# Patient Record
Sex: Female | Born: 1970 | Race: White | Hispanic: No | Marital: Single | Smoking: Current some day smoker
Health system: Southern US, Community
[De-identification: ages and names within clinical notes are randomized; demographics above are authoritative.]

## PROBLEM LIST (undated history)

## (undated) DIAGNOSIS — J42 Unspecified chronic bronchitis: Secondary | ICD-10-CM

## (undated) DIAGNOSIS — N200 Calculus of kidney: Secondary | ICD-10-CM

## (undated) DIAGNOSIS — J189 Pneumonia, unspecified organism: Secondary | ICD-10-CM

## (undated) DIAGNOSIS — F329 Major depressive disorder, single episode, unspecified: Secondary | ICD-10-CM

## (undated) DIAGNOSIS — J449 Chronic obstructive pulmonary disease, unspecified: Secondary | ICD-10-CM

## (undated) DIAGNOSIS — M545 Low back pain, unspecified: Secondary | ICD-10-CM

## (undated) DIAGNOSIS — G43909 Migraine, unspecified, not intractable, without status migrainosus: Secondary | ICD-10-CM

## (undated) DIAGNOSIS — F32A Depression, unspecified: Secondary | ICD-10-CM

## (undated) DIAGNOSIS — S329XXA Fracture of unspecified parts of lumbosacral spine and pelvis, initial encounter for closed fracture: Secondary | ICD-10-CM

## (undated) DIAGNOSIS — IMO0002 Reserved for concepts with insufficient information to code with codable children: Secondary | ICD-10-CM

## (undated) DIAGNOSIS — K219 Gastro-esophageal reflux disease without esophagitis: Secondary | ICD-10-CM

## (undated) DIAGNOSIS — R0602 Shortness of breath: Secondary | ICD-10-CM

## (undated) DIAGNOSIS — F419 Anxiety disorder, unspecified: Secondary | ICD-10-CM

## (undated) DIAGNOSIS — B1920 Unspecified viral hepatitis C without hepatic coma: Secondary | ICD-10-CM

## (undated) DIAGNOSIS — G8929 Other chronic pain: Secondary | ICD-10-CM

---

## 1997-07-11 HISTORY — PX: HYSTEROSCOPY: SHX211

## 1997-07-11 HISTORY — PX: TUBAL LIGATION: SHX77

## 1999-07-12 HISTORY — PX: VAGINAL HYSTERECTOMY: SUR661

## 2004-12-24 ENCOUNTER — Emergency Department (HOSPITAL_COMMUNITY): Admission: EM | Admit: 2004-12-24 | Discharge: 2004-12-24 | Payer: Self-pay | Admitting: Emergency Medicine

## 2004-12-25 ENCOUNTER — Emergency Department (HOSPITAL_COMMUNITY): Admission: EM | Admit: 2004-12-25 | Discharge: 2004-12-25 | Payer: Self-pay | Admitting: Emergency Medicine

## 2005-01-15 ENCOUNTER — Emergency Department (HOSPITAL_COMMUNITY): Admission: EM | Admit: 2005-01-15 | Discharge: 2005-01-15 | Payer: Self-pay | Admitting: Emergency Medicine

## 2005-02-28 ENCOUNTER — Emergency Department (HOSPITAL_COMMUNITY): Admission: EM | Admit: 2005-02-28 | Discharge: 2005-02-28 | Payer: Self-pay | Admitting: Emergency Medicine

## 2005-09-21 ENCOUNTER — Emergency Department (HOSPITAL_COMMUNITY): Admission: EM | Admit: 2005-09-21 | Discharge: 2005-09-21 | Payer: Self-pay | Admitting: Emergency Medicine

## 2005-09-25 ENCOUNTER — Emergency Department (HOSPITAL_COMMUNITY): Admission: EM | Admit: 2005-09-25 | Discharge: 2005-09-26 | Payer: Self-pay | Admitting: Emergency Medicine

## 2005-12-23 ENCOUNTER — Emergency Department (HOSPITAL_COMMUNITY): Admission: EM | Admit: 2005-12-23 | Discharge: 2005-12-24 | Payer: Self-pay | Admitting: Emergency Medicine

## 2006-06-05 ENCOUNTER — Emergency Department: Payer: Self-pay | Admitting: Emergency Medicine

## 2006-06-06 ENCOUNTER — Other Ambulatory Visit: Payer: Self-pay

## 2006-06-10 ENCOUNTER — Emergency Department: Payer: Self-pay | Admitting: Emergency Medicine

## 2006-06-10 ENCOUNTER — Other Ambulatory Visit: Payer: Self-pay

## 2006-06-16 ENCOUNTER — Emergency Department: Payer: Self-pay | Admitting: Unknown Physician Specialty

## 2006-08-20 ENCOUNTER — Emergency Department (HOSPITAL_COMMUNITY): Admission: EM | Admit: 2006-08-20 | Discharge: 2006-08-21 | Payer: Self-pay | Admitting: Emergency Medicine

## 2006-08-20 ENCOUNTER — Emergency Department: Payer: Self-pay | Admitting: Emergency Medicine

## 2006-11-22 ENCOUNTER — Emergency Department (HOSPITAL_COMMUNITY): Admission: EM | Admit: 2006-11-22 | Discharge: 2006-11-22 | Payer: Self-pay | Admitting: Emergency Medicine

## 2007-01-03 ENCOUNTER — Emergency Department: Payer: Self-pay | Admitting: Emergency Medicine

## 2007-03-23 ENCOUNTER — Emergency Department (HOSPITAL_COMMUNITY): Admission: EM | Admit: 2007-03-23 | Discharge: 2007-03-23 | Payer: Self-pay | Admitting: Emergency Medicine

## 2007-05-09 ENCOUNTER — Emergency Department: Payer: Self-pay | Admitting: Emergency Medicine

## 2007-06-23 ENCOUNTER — Emergency Department (HOSPITAL_COMMUNITY): Admission: EM | Admit: 2007-06-23 | Discharge: 2007-06-23 | Payer: Self-pay | Admitting: Emergency Medicine

## 2007-07-12 HISTORY — PX: CHOLECYSTECTOMY: SHX55

## 2007-08-23 ENCOUNTER — Observation Stay: Payer: Self-pay | Admitting: Obstetrics and Gynecology

## 2007-08-26 ENCOUNTER — Emergency Department: Payer: Self-pay | Admitting: Emergency Medicine

## 2007-09-09 ENCOUNTER — Emergency Department: Payer: Self-pay | Admitting: Emergency Medicine

## 2007-09-22 ENCOUNTER — Emergency Department: Payer: Self-pay | Admitting: Emergency Medicine

## 2007-10-23 ENCOUNTER — Emergency Department (HOSPITAL_COMMUNITY): Admission: EM | Admit: 2007-10-23 | Discharge: 2007-10-24 | Payer: Self-pay | Admitting: Emergency Medicine

## 2007-10-30 ENCOUNTER — Emergency Department: Payer: Self-pay | Admitting: Emergency Medicine

## 2007-11-13 ENCOUNTER — Emergency Department (HOSPITAL_COMMUNITY): Admission: EM | Admit: 2007-11-13 | Discharge: 2007-11-13 | Payer: Self-pay | Admitting: Emergency Medicine

## 2007-11-21 ENCOUNTER — Emergency Department: Payer: Self-pay | Admitting: Emergency Medicine

## 2007-11-21 ENCOUNTER — Other Ambulatory Visit: Payer: Self-pay

## 2007-12-24 ENCOUNTER — Emergency Department (HOSPITAL_COMMUNITY): Admission: EM | Admit: 2007-12-24 | Discharge: 2007-12-24 | Payer: Self-pay | Admitting: Emergency Medicine

## 2008-07-12 ENCOUNTER — Emergency Department (HOSPITAL_COMMUNITY): Admission: EM | Admit: 2008-07-12 | Discharge: 2008-07-12 | Payer: Self-pay | Admitting: Emergency Medicine

## 2008-09-02 ENCOUNTER — Emergency Department (HOSPITAL_COMMUNITY): Admission: EM | Admit: 2008-09-02 | Discharge: 2008-09-03 | Payer: Self-pay | Admitting: Emergency Medicine

## 2008-09-15 ENCOUNTER — Emergency Department (HOSPITAL_COMMUNITY): Admission: EM | Admit: 2008-09-15 | Discharge: 2008-09-16 | Payer: Self-pay | Admitting: Emergency Medicine

## 2009-08-13 ENCOUNTER — Emergency Department: Payer: Self-pay | Admitting: Emergency Medicine

## 2009-09-03 ENCOUNTER — Emergency Department: Payer: Self-pay | Admitting: Emergency Medicine

## 2010-08-01 ENCOUNTER — Encounter: Payer: Self-pay | Admitting: *Deleted

## 2010-10-21 LAB — POCT I-STAT, CHEM 8
Chloride: 107 mEq/L (ref 96–112)
Glucose, Bld: 92 mg/dL (ref 70–99)
HCT: 36 % (ref 36.0–46.0)
Hemoglobin: 12.2 g/dL (ref 12.0–15.0)
Potassium: 3.9 mEq/L (ref 3.5–5.1)
Sodium: 142 mEq/L (ref 135–145)

## 2010-10-21 LAB — URINALYSIS, ROUTINE W REFLEX MICROSCOPIC
Bilirubin Urine: NEGATIVE
Leukocytes, UA: NEGATIVE
Nitrite: NEGATIVE
Specific Gravity, Urine: 1.017 (ref 1.005–1.030)
Urobilinogen, UA: 0.2 mg/dL (ref 0.0–1.0)
pH: 7 (ref 5.0–8.0)

## 2010-10-21 LAB — CBC
MCHC: 35.5 g/dL (ref 30.0–36.0)
Platelets: 282 10*3/uL (ref 150–400)
RBC: 3.87 MIL/uL (ref 3.87–5.11)
WBC: 5.8 10*3/uL (ref 4.0–10.5)

## 2010-10-21 LAB — DIFFERENTIAL
Basophils Relative: 1 % (ref 0–1)
Lymphs Abs: 1 10*3/uL (ref 0.7–4.0)
Monocytes Relative: 7 % (ref 3–12)
Neutro Abs: 4 10*3/uL (ref 1.7–7.7)
Neutrophils Relative %: 69 % (ref 43–77)

## 2010-10-21 LAB — BASIC METABOLIC PANEL
BUN: 9 mg/dL (ref 6–23)
Calcium: 8.9 mg/dL (ref 8.4–10.5)
Creatinine, Ser: 0.74 mg/dL (ref 0.4–1.2)
GFR calc Af Amer: >60 mL/min (ref >60)

## 2010-10-21 LAB — URINE MICROSCOPIC-ADD ON

## 2010-10-25 LAB — URINE MICROSCOPIC-ADD ON

## 2010-10-25 LAB — URINE CULTURE

## 2010-10-25 LAB — CBC
Hemoglobin: 12.6 g/dL (ref 12.0–15.0)
MCHC: 33.7 g/dL (ref 30.0–36.0)
RBC: 4.01 MIL/uL (ref 3.87–5.11)

## 2010-10-25 LAB — COMPREHENSIVE METABOLIC PANEL
ALT: 17 U/L (ref 0–35)
AST: 22 U/L (ref 0–37)
Alkaline Phosphatase: 50 U/L (ref 39–117)
CO2: 24 mEq/L (ref 19–32)
Calcium: 9.5 mg/dL (ref 8.4–10.5)
GFR calc Af Amer: >60 mL/min (ref >60)
GFR calc non Af Amer: >60 mL/min (ref >60)
Glucose, Bld: 98 mg/dL (ref 70–99)
Potassium: 3.7 mEq/L (ref 3.5–5.1)
Sodium: 138 mEq/L (ref 135–145)
Total Protein: 7.4 g/dL (ref 6.0–8.3)

## 2010-10-25 LAB — URINALYSIS, ROUTINE W REFLEX MICROSCOPIC
Bilirubin Urine: NEGATIVE
Glucose, UA: NEGATIVE mg/dL
Ketones, ur: NEGATIVE mg/dL
Protein, ur: 30 mg/dL — AB
pH: 6 (ref 5.0–8.0)

## 2010-10-25 LAB — DIFFERENTIAL
Basophils Relative: 1 % (ref 0–1)
Eosinophils Absolute: 0.2 10*3/uL (ref 0.0–0.7)
Eosinophils Relative: 4 % (ref 0–5)
Lymphs Abs: 2.3 10*3/uL (ref 0.7–4.0)
Monocytes Relative: 8 % (ref 3–12)

## 2010-10-26 LAB — BASIC METABOLIC PANEL
BUN: 8 mg/dL (ref 6–23)
CO2: 22 mEq/L (ref 19–32)
Chloride: 106 mEq/L (ref 96–112)
Creatinine, Ser: 0.72 mg/dL (ref 0.4–1.2)
GFR calc Af Amer: >60 mL/min (ref >60)
Glucose, Bld: 103 mg/dL — ABNORMAL HIGH (ref 70–99)

## 2010-10-26 LAB — URINE MICROSCOPIC-ADD ON

## 2010-10-26 LAB — URINALYSIS, ROUTINE W REFLEX MICROSCOPIC
Nitrite: NEGATIVE
Protein, ur: NEGATIVE mg/dL
Specific Gravity, Urine: 1.022 (ref 1.005–1.030)
Urobilinogen, UA: 0.2 mg/dL (ref 0.0–1.0)

## 2010-10-26 LAB — CBC
MCHC: 34.1 g/dL (ref 30.0–36.0)
MCV: 91.4 fL (ref 78.0–100.0)
RBC: 3.95 MIL/uL (ref 3.87–5.11)
RDW: 12.8 % (ref 11.5–15.5)

## 2010-10-26 LAB — DIFFERENTIAL
Basophils Relative: 1 % (ref 0–1)
Eosinophils Absolute: 0.1 10*3/uL (ref 0.0–0.7)
Monocytes Relative: 7 % (ref 3–12)
Neutrophils Relative %: 67 % (ref 43–77)

## 2011-01-19 ENCOUNTER — Emergency Department: Payer: Self-pay | Admitting: Emergency Medicine

## 2011-01-25 ENCOUNTER — Emergency Department: Payer: Self-pay | Admitting: Emergency Medicine

## 2011-04-05 LAB — DIFFERENTIAL
Basophils Absolute: 0
Basophils Relative: 0
Eosinophils Absolute: 0.2
Monocytes Absolute: 0.6
Monocytes Relative: 8
Neutro Abs: 4.6
Neutrophils Relative %: 67

## 2011-04-05 LAB — BASIC METABOLIC PANEL
Calcium: 9.1
Creatinine, Ser: 0.65
GFR calc Af Amer: >60
GFR calc non Af Amer: >60

## 2011-04-05 LAB — URINALYSIS, ROUTINE W REFLEX MICROSCOPIC
Bilirubin Urine: NEGATIVE
Nitrite: NEGATIVE
Specific Gravity, Urine: 1.017
pH: 6

## 2011-04-05 LAB — CBC
RBC: 4.07
WBC: 6.8

## 2011-04-05 LAB — PREGNANCY, URINE: Preg Test, Ur: NEGATIVE

## 2011-04-05 LAB — WET PREP, GENITAL: Clue Cells Wet Prep HPF POC: NONE SEEN

## 2011-04-05 LAB — GC/CHLAMYDIA PROBE AMP, GENITAL: Chlamydia, DNA Probe: NEGATIVE

## 2011-04-05 LAB — URINE CULTURE

## 2011-04-05 LAB — URINE MICROSCOPIC-ADD ON

## 2011-04-18 LAB — URINALYSIS, ROUTINE W REFLEX MICROSCOPIC
Bilirubin Urine: NEGATIVE
Glucose, UA: NEGATIVE
Ketones, ur: NEGATIVE
Nitrite: NEGATIVE
Protein, ur: NEGATIVE
Specific Gravity, Urine: 1.01
Urobilinogen, UA: 0.2
pH: 6.5

## 2011-04-18 LAB — URINE MICROSCOPIC-ADD ON

## 2011-04-22 LAB — URINALYSIS, ROUTINE W REFLEX MICROSCOPIC
Glucose, UA: NEGATIVE
Protein, ur: NEGATIVE

## 2011-04-22 LAB — DIFFERENTIAL
Eosinophils Absolute: 0.5
Lymphs Abs: 2.2
Monocytes Absolute: 0.4
Monocytes Relative: 8
Neutrophils Relative %: 45

## 2011-04-22 LAB — COMPREHENSIVE METABOLIC PANEL
ALT: 11
AST: 17
Albumin: 3.5
Calcium: 9
GFR calc Af Amer: >60
Glucose, Bld: 81
Sodium: 141
Total Protein: 6.4

## 2011-04-22 LAB — URINE MICROSCOPIC-ADD ON

## 2011-04-22 LAB — CBC
MCHC: 35.2
Platelets: 241
RDW: 12.7

## 2012-03-11 DIAGNOSIS — B1920 Unspecified viral hepatitis C without hepatic coma: Secondary | ICD-10-CM

## 2012-03-11 HISTORY — DX: Unspecified viral hepatitis C without hepatic coma: B19.20

## 2013-02-25 ENCOUNTER — Other Ambulatory Visit: Payer: Self-pay | Admitting: Internal Medicine

## 2013-07-13 ENCOUNTER — Emergency Department (HOSPITAL_COMMUNITY)
Admission: EM | Admit: 2013-07-13 | Discharge: 2013-07-13 | Disposition: A | Discharge status: Home / Self Care | Payer: No Typology Code available for payment source | Attending: Emergency Medicine | Admitting: Emergency Medicine

## 2013-07-13 ENCOUNTER — Encounter (HOSPITAL_COMMUNITY): Payer: Self-pay | Admitting: Emergency Medicine

## 2013-07-13 DIAGNOSIS — Z8709 Personal history of other diseases of the respiratory system: Secondary | ICD-10-CM | POA: Insufficient documentation

## 2013-07-13 DIAGNOSIS — Z79899 Other long term (current) drug therapy: Secondary | ICD-10-CM | POA: Insufficient documentation

## 2013-07-13 DIAGNOSIS — Z8679 Personal history of other diseases of the circulatory system: Secondary | ICD-10-CM | POA: Insufficient documentation

## 2013-07-13 DIAGNOSIS — R110 Nausea: Secondary | ICD-10-CM | POA: Insufficient documentation

## 2013-07-13 DIAGNOSIS — R1032 Left lower quadrant pain: Secondary | ICD-10-CM | POA: Insufficient documentation

## 2013-07-13 DIAGNOSIS — Z791 Long term (current) use of non-steroidal anti-inflammatories (NSAID): Secondary | ICD-10-CM | POA: Insufficient documentation

## 2013-07-13 DIAGNOSIS — N898 Other specified noninflammatory disorders of vagina: Secondary | ICD-10-CM | POA: Insufficient documentation

## 2013-07-13 DIAGNOSIS — F17200 Nicotine dependence, unspecified, uncomplicated: Secondary | ICD-10-CM | POA: Insufficient documentation

## 2013-07-13 DIAGNOSIS — Z87448 Personal history of other diseases of urinary system: Secondary | ICD-10-CM | POA: Insufficient documentation

## 2013-07-13 DIAGNOSIS — R350 Frequency of micturition: Secondary | ICD-10-CM | POA: Insufficient documentation

## 2013-07-13 DIAGNOSIS — R197 Diarrhea, unspecified: Secondary | ICD-10-CM | POA: Insufficient documentation

## 2013-07-13 DIAGNOSIS — Z87440 Personal history of urinary (tract) infections: Secondary | ICD-10-CM | POA: Insufficient documentation

## 2013-07-13 DIAGNOSIS — Z880 Allergy status to penicillin: Secondary | ICD-10-CM | POA: Insufficient documentation

## 2013-07-13 DIAGNOSIS — R109 Unspecified abdominal pain: Secondary | ICD-10-CM

## 2013-07-13 DIAGNOSIS — R339 Retention of urine, unspecified: Secondary | ICD-10-CM | POA: Insufficient documentation

## 2013-07-13 HISTORY — DX: Migraine, unspecified, not intractable, without status migrainosus: G43.909

## 2013-07-13 HISTORY — DX: Unspecified chronic bronchitis: J42

## 2013-07-13 HISTORY — DX: Calculus of kidney: N20.0

## 2013-07-13 LAB — CBC WITH DIFFERENTIAL/PLATELET
BASOS PCT: 1 % (ref 0–1)
Basophils Absolute: 0.1 10*3/uL (ref 0.0–0.1)
EOS ABS: 0.4 10*3/uL (ref 0.0–0.7)
EOS PCT: 8 % — AB (ref 0–5)
HCT: 33.5 % — ABNORMAL LOW (ref 36.0–46.0)
HEMOGLOBIN: 11.5 g/dL — AB (ref 12.0–15.0)
LYMPHS ABS: 1.5 10*3/uL (ref 0.7–4.0)
Lymphocytes Relative: 28 % (ref 12–46)
MCH: 31.2 pg (ref 26.0–34.0)
MCHC: 34.3 g/dL (ref 30.0–36.0)
MCV: 90.8 fL (ref 78.0–100.0)
MONO ABS: 0.4 10*3/uL (ref 0.1–1.0)
MONOS PCT: 7 % (ref 3–12)
NEUTROS PCT: 56 % (ref 43–77)
Neutro Abs: 2.8 10*3/uL (ref 1.7–7.7)
Platelets: 306 10*3/uL (ref 150–400)
RBC: 3.69 MIL/uL — AB (ref 3.87–5.11)
RDW: 13.1 % (ref 11.5–15.5)
WBC: 5.1 10*3/uL (ref 4.0–10.5)

## 2013-07-13 LAB — COMPREHENSIVE METABOLIC PANEL
ALBUMIN: 3.6 g/dL (ref 3.5–5.2)
ALK PHOS: 58 U/L (ref 39–117)
ALT: 59 U/L — ABNORMAL HIGH (ref 0–35)
AST: 36 U/L (ref 0–37)
BUN: 11 mg/dL (ref 6–23)
CALCIUM: 9.1 mg/dL (ref 8.4–10.5)
CO2: 24 mEq/L (ref 19–32)
CREATININE: 0.77 mg/dL (ref 0.50–1.10)
Chloride: 106 mEq/L (ref 96–112)
GFR calc non Af Amer: >90 mL/min (ref >90)
GLUCOSE: 79 mg/dL (ref 70–99)
Potassium: 4 mEq/L (ref 3.7–5.3)
Sodium: 142 mEq/L (ref 137–147)
TOTAL PROTEIN: 7.2 g/dL (ref 6.0–8.3)
Total Bilirubin: 0.2 mg/dL — ABNORMAL LOW (ref 0.3–1.2)

## 2013-07-13 LAB — URINALYSIS, ROUTINE W REFLEX MICROSCOPIC
BILIRUBIN URINE: NEGATIVE
Glucose, UA: NEGATIVE mg/dL
Ketones, ur: NEGATIVE mg/dL
LEUKOCYTES UA: NEGATIVE
NITRITE: NEGATIVE
PH: 6 (ref 5.0–8.0)
Protein, ur: NEGATIVE mg/dL
SPECIFIC GRAVITY, URINE: 1.019 (ref 1.005–1.030)
UROBILINOGEN UA: 0.2 mg/dL (ref 0.0–1.0)

## 2013-07-13 LAB — URINE MICROSCOPIC-ADD ON

## 2013-07-13 LAB — WET PREP, GENITAL
CLUE CELLS WET PREP: NONE SEEN
TRICH WET PREP: NONE SEEN
Yeast Wet Prep HPF POC: NONE SEEN

## 2013-07-13 LAB — OCCULT BLOOD, POC DEVICE: FECAL OCCULT BLD: NEGATIVE

## 2013-07-13 LAB — LIPASE, BLOOD: LIPASE: 26 U/L (ref 11–59)

## 2013-07-13 MED ORDER — DICYCLOMINE HCL 20 MG PO TABS: 20.0000 mg | ORAL_TABLET | Freq: Two times a day (BID) | ORAL | Status: DC

## 2013-07-13 MED ORDER — SODIUM CHLORIDE 0.9 % IV BOLUS (SEPSIS)
1000.0000 mL | Freq: Once | INTRAVENOUS | Status: AC
  Administered 2013-07-13: 1000 mL via INTRAVENOUS

## 2013-07-13 MED ORDER — DICYCLOMINE HCL 10 MG PO CAPS
10.0000 mg | ORAL_CAPSULE | Freq: Once | ORAL | Status: AC
  Administered 2013-07-13: 10 mg via ORAL
  Filled 2013-07-13: qty 1

## 2013-07-13 MED ORDER — MORPHINE SULFATE 4 MG/ML IJ SOLN
4.0000 mg | Freq: Once | INTRAMUSCULAR | Status: AC
  Administered 2013-07-13: 4 mg via INTRAVENOUS
  Filled 2013-07-13: qty 1

## 2013-07-13 MED ORDER — DICYCLOMINE HCL 10 MG/ML IM SOLN: 20.0000 mg | Freq: Once | INTRAMUSCULAR | Status: DC

## 2013-07-13 MED ORDER — ONDANSETRON HCL 4 MG/2ML IJ SOLN
4.0000 mg | Freq: Once | INTRAMUSCULAR | Status: AC
  Administered 2013-07-13: 4 mg via INTRAVENOUS
  Filled 2013-07-13: qty 2

## 2013-07-13 NOTE — ED Provider Notes (Signed)
CSN: [id number]     Arrival date & time 07/13/13  [id number] History   First MD Initiated Contact with Patient 07/13/13 [phone]     Chief Complaint  Patient presents with  . Abdominal Pain   (Consider location/radiation/quality/duration/timing/severity/associated sxs/prior Treatment) HPI Comments: Patient is a 43 year old female with history of chronic bronchitis, kidney stones, and migraines who presents today for abdominal pain. For the past 6 months she has been having lower abdominal cramping associated with loose stools and "rabbit" sized stool. She feels as though her loose stools are mucousy. She reports that they have been gradually darkening without BRBPR. Her pain has the quality of "a pitchfork being stuck in me and twisted". The pain does not radiate. She has not taken anything for the pain. She has urinary frequency and feeling of incomplete emptying. This has also been chronic for the past 6 months. She has been evaluated for this in the past and was referred to GI. She has not followed up because she was recently released from prison. She denies fevers, chills, vomiting, chest pain, shortness of breath.   The history is provided by the patient. No language interpreter was used.    Past Medical History  Diagnosis Date  . Chronic bronchitis   . Renal disorder   . Kidney stone   . Migraine    Past Surgical History  Procedure Laterality Date  . Cholecystectomy    . Abdominal hysterectomy     No family history on file. History  Substance Use Topics  . Smoking status: Current Every Day Smoker -- 0.50 packs/day    Types: Cigarettes  . Smokeless tobacco: Not on file  . Alcohol Use: No   OB History   Grav Para Term Preterm Abortions TAB SAB Ect Mult Living                 Review of Systems  Constitutional: Negative for fever and chills.  Respiratory: Negative for shortness of breath.   Cardiovascular: Negative for chest pain.  Gastrointestinal: Positive for nausea, abdominal  pain and diarrhea (loose stools). Negative for vomiting.  Genitourinary: Positive for frequency. Negative for dysuria, vaginal bleeding and vaginal discharge.  All other systems reviewed and are negative.    Allergies  Bactrim and Penicillins  Home Medications   Current Outpatient Rx  Name  Route  Sig  Dispense  Refill  . albuterol (PROVENTIL HFA;VENTOLIN HFA) 108 (90 BASE) MCG/ACT inhaler   Inhalation   Inhale 2 puffs into the lungs every 6 (six) hours as needed for wheezing or shortness of breath.         . citalopram (CELEXA) 40 MG tablet   Oral   Take 40 mg by mouth daily.         . hydrOXYzine (ATARAX/VISTARIL) 50 MG tablet   Oral   Take 50 mg by mouth 2 (two) times daily.         . hyoscyamine (LEVSIN SL) 0.125 MG SL tablet   Sublingual   Place 0.125 mg under the tongue every 4 (four) hours as needed for cramping.         Marland Kitchen. ibuprofen (ADVIL,MOTRIN) 800 MG tablet   Oral   Take 800 mg by mouth 3 (three) times daily.         Marland Kitchen. loratadine (CLARITIN) 10 MG tablet   Oral   Take 10 mg by mouth daily.          BP 127/91  Pulse 121  Temp(Src)  98.3 F (36.8 C) (Oral)  Resp 18  SpO2 100% Physical Exam  Nursing note and vitals reviewed. Constitutional: She is oriented to person, place, and time. She appears well-developed and well-nourished. No distress.  HENT:  Head: Normocephalic and atraumatic.  Right Ear: External ear normal.  Left Ear: External ear normal.  Nose: Nose normal.  Mouth/Throat: Oropharynx is clear and moist.  Eyes: Conjunctivae are normal.  Neck: Normal range of motion.  Cardiovascular: Normal rate, regular rhythm and normal heart sounds.   Pulmonary/Chest: Effort normal and breath sounds normal. No stridor. No respiratory distress. She has no wheezes. She has no rales.  Abdominal: Soft. She exhibits no distension. There is tenderness in the suprapubic area and left lower quadrant. There is no rigidity, no rebound and no guarding.   Genitourinary: Rectum normal. Rectal exam shows no mass, no tenderness and anal tone normal. Guaiac negative stool. No labial fusion. There is no rash, tenderness, lesion or injury on the right labia. There is no rash, tenderness, lesion or injury on the left labia. Cervix exhibits discharge (white). Cervix exhibits no motion tenderness and no friability. Right adnexum displays tenderness. Right adnexum displays no mass and no fullness. Left adnexum displays tenderness. Left adnexum displays no mass and no fullness. No erythema, tenderness or bleeding around the vagina. No foreign body around the vagina. No signs of injury around the vagina. No vaginal discharge found.  Light brown stool seen on rectal exam  Musculoskeletal: Normal range of motion.  Neurological: She is alert and oriented to person, place, and time. She has normal strength.  Skin: Skin is warm and dry. She is not diaphoretic. No erythema.  Psychiatric: She has a normal mood and affect. Her behavior is normal.    ED Course  Procedures (including critical care time) Labs Review Labs Reviewed  WET PREP, GENITAL - Abnormal; Notable for the following:    WBC, Wet Prep HPF POC FEW (*)    All other components within normal limits  CBC WITH DIFFERENTIAL - Abnormal; Notable for the following:    RBC 3.69 (*)    Hemoglobin 11.5 (*)    HCT 33.5 (*)    Eosinophils Relative 8 (*)    All other components within normal limits  COMPREHENSIVE METABOLIC PANEL - Abnormal; Notable for the following:    ALT 59 (*)    Total Bilirubin 0.2 (*)    All other components within normal limits  URINALYSIS, ROUTINE W REFLEX MICROSCOPIC - Abnormal; Notable for the following:    APPearance CLOUDY (*)    Hgb urine dipstick SMALL (*)    All other components within normal limits  URINE MICROSCOPIC-ADD ON - Abnormal; Notable for the following:    Squamous Epithelial / LPF FEW (*)    Bacteria, UA FEW (*)    All other components within normal limits   GC/CHLAMYDIA PROBE AMP  LIPASE, BLOOD   Imaging Review No results found.  EKG Interpretation   None       MDM   1. Abdominal pain    Patient is nontoxic, nonseptic appearing, in no apparent distress.  Patient's pain and other symptoms adequately managed in emergency department.  Patient feels significantly improved after Bentyl. Fluid bolus given.  Labs and vitals reviewed. I do not feel patient needs additional imaging at this time. Patient does not meet the SIRS or Sepsis criteria.  On repeat exam patient does not have a surgical abdomen and there are no peritoneal signs.  No indication of  appendicitis, bowel obstruction, bowel perforation, cholecystitis, diverticulitis, ovarian torsion, PID, or ectopic pregnancy.  Patient discharged home with symptomatic treatment and given strict instructions for follow-up with GI.  I have also discussed reasons to return immediately to the ER.  Patient expresses understanding and agrees with plan.       Mora Bellman, PA-C 07/13/13 [phone]

## 2013-07-13 NOTE — Discharge Instructions (Signed)
Abdominal Pain, Women °Abdominal (stomach, pelvic, or belly) pain can be caused by many things. It is important to tell your doctor: °· The location of the pain. °· Does it come and go or is it present all the time? °· Are there things that start the pain (eating certain foods, exercise)? °· Are there other symptoms associated with the pain (fever, nausea, vomiting, diarrhea)? °All of this is helpful to know when trying to find the cause of the pain. °CAUSES  °· Stomach: virus or bacteria infection, or ulcer. °· Intestine: appendicitis (inflamed appendix), regional ileitis (Crohn's disease), ulcerative colitis (inflamed colon), irritable bowel syndrome, diverticulitis (inflamed diverticulum of the colon), or cancer of the stomach or intestine. °· Gallbladder disease or stones in the gallbladder. °· Kidney disease, kidney stones, or infection. °· Pancreas infection or cancer. °· Fibromyalgia (pain disorder). °· Diseases of the female organs: °· Uterus: fibroid (non-cancerous) tumors or infection. °· Fallopian tubes: infection or tubal pregnancy. °· Ovary: cysts or tumors. °· Pelvic adhesions (scar tissue). °· Endometriosis (uterus lining tissue growing in the pelvis and on the pelvic organs). °· Pelvic congestion syndrome (female organs filling up with blood just before the menstrual period). °· Pain with the menstrual period. °· Pain with ovulation (producing an egg). °· Pain with an IUD (intrauterine device, birth control) in the uterus. °· Cancer of the female organs. °· Functional pain (pain not caused by a disease, may improve without treatment). °· Psychological pain. °· Depression. °DIAGNOSIS  °Your doctor will decide the seriousness of your pain by doing an examination. °· Blood tests. °· X-rays. °· Ultrasound. °· CT scan (computed tomography, special type of X-ray). °· MRI (magnetic resonance imaging). °· Cultures, for infection. °· Barium enema (dye inserted in the large intestine, to better view it with  X-rays). °· Colonoscopy (looking in intestine with a lighted tube). °· Laparoscopy (minor surgery, looking in abdomen with a lighted tube). °· Major abdominal exploratory surgery (looking in abdomen with a large incision). °TREATMENT  °The treatment will depend on the cause of the pain.  °· Many cases can be observed and treated at home. °· Over-the-counter medicines recommended by your caregiver. °· Prescription medicine. °· Antibiotics, for infection. °· Birth control pills, for painful periods or for ovulation pain. °· Hormone treatment, for endometriosis. °· Nerve blocking injections. °· Physical therapy. °· Antidepressants. °· Counseling with a psychologist or psychiatrist. °· Minor or major surgery. °HOME CARE INSTRUCTIONS  °· Do not take laxatives, unless directed by your caregiver. °· Take over-the-counter pain medicine only if ordered by your caregiver. Do not take aspirin because it can cause an upset stomach or bleeding. °· Try a clear liquid diet (broth or water) as ordered by your caregiver. Slowly move to a bland diet, as tolerated, if the pain is related to the stomach or intestine. °· Have a thermometer and take your temperature several times a day, and record it. °· Bed rest and sleep, if it helps the pain. °· Avoid sexual intercourse, if it causes pain. °· Avoid stressful situations. °· Keep your follow-up appointments and tests, as your caregiver orders. °· If the pain does not go away with medicine or surgery, you may try: °· Acupuncture. °· Relaxation exercises (yoga, meditation). °· Group therapy. °· Counseling. °SEEK MEDICAL CARE IF:  °· You notice certain foods cause stomach pain. °· Your home care treatment is not helping your pain. °· You need stronger pain medicine. °· You want your IUD removed. °· You feel faint or   lightheaded. °· You develop nausea and vomiting. °· You develop a rash. °· You are having side effects or an allergy to your medicine. °SEEK IMMEDIATE MEDICAL CARE IF:  °· Your  pain does not go away or gets worse. °· You have a fever. °· Your pain is felt only in portions of the abdomen. The right side could possibly be appendicitis. The left lower portion of the abdomen could be colitis or diverticulitis. °· You are passing blood in your stools (bright red or black tarry stools, with or without vomiting). °· You have blood in your urine. °· You develop chills, with or without a fever. °· You pass out. °MAKE SURE YOU:  °· Understand these instructions. °· Will watch your condition. °· Will get help right away if you are not doing well or get worse. °Document Released: 04/24/2007 Document Revised: 09/19/2011 Document Reviewed: 05/14/2009 °ExitCare® Patient Information ©2014 [employer]. ° °Emergency Department Resource Guide °1) Find a Doctor and Pay Out of Pocket °Although you won't have to find out who is covered by your insurance plan, it is a good idea to ask around and get recommendations. You will then need to call the office and see if the doctor you have chosen will accept you as a new patient and what types of options they offer for patients who are self-pay. Some doctors offer discounts or will set up payment plans for their patients who do not have insurance, but you will need to ask so you aren't surprised when you get to your appointment. ° °2) Contact Your Local Health Department °Not all health departments have doctors that can see patients for sick visits, but many do, so it is worth a call to see if yours does. If you don't know where your local health department is, you can check in your phone book. The CDC also has a tool to help you locate your state's health department, and many state websites also have listings of all of their local health departments. ° °3) Find a Walk-in Clinic °If your illness is not likely to be very severe or complicated, you may want to try a walk in clinic. These are popping up all over the country in pharmacies, drugstores, and shopping  centers. They're usually staffed by nurse practitioners or physician assistants that have been trained to treat common illnesses and complaints. They're usually fairly quick and inexpensive. However, if you have serious medical issues or chronic medical problems, these are probably not your best option. ° °No Primary Care Doctor: °- Call [facility] at  832-8000 - they can help you locate a primary care doctor that  accepts your insurance, provides certain services, etc. °- Physician Referral Service- 1-800-533-3463 ° °Chronic Pain Problems: °Organization         Address  Phone   Notes  °[facility]  (336) 297-2271 Patients need to be referred by their primary care doctor.  ° °Medication Assistance: °Organization         Address  Phone   Notes  °[place] [facility] [address] °[place], [place] [address] (336) 641-8030 --Must be a resident of [place] °-- Must have NO insurance coverage whatsoever (no Medicaid/ Medicare, etc.) °-- The pt. MUST have a primary care doctor that directs their care regularly and follows them in the community °  °[facility]  (866) 331-1348   °[employer]  (888) 892-1162   ° °Agencies that provide inexpensive medical care: °Organization           Address  Phone   Notes  °[facility]  (336) 832-8035   °[facility]    (336) 832-7272   °[facility] [address] °[place], [place] [address] (336) 832-4777   °[facility] [address], °[place] (336) 271-4999   °[facility]    (336) 373-0678   °[facility]    (336) 272-1050   °[facility] ° [address], [place] Phone:  (336) 832-4444, Fax:  (336) 832-4440 Hours of Operation:  9 am - 6 pm, M-F.  Also accepts Medicaid/Medicare and self-pay.  °[facility] ° [address], [place] Phone: (336) [phone], Fax: (336)  [phone]. Hours of Operation:  8:30 am - 5:30 pm, M-F.  Also accepts Medicaid and self-pay.  °[facility] [place] [address], [place] Phone: (336) 878-6027   °[facility] [address], [place], [place] (336)723-1848, Ext. 123 Mondays & Thursdays: 7-9 AM.  First 15 patients are seen on a first come, first serve basis. °  ° °Medicaid-accepting [place] Providers: ° °Organization         Address  Phone   Notes  °[facility] [address], [place] (336) 641-2100 Also accepts self-pay patients.  °[facility] [address], [place] ° (336) 856-9996   °[facility] [address], [place] (336) 288-8857   °[facility] [address] [place] [address], [place] (336) 299-7000   °Veita Bland [address], [place]  ° (336) 373-1557 Only accepts [place] Access Medicaid patients after they have their name applied to their card.  ° °Self-Pay (no insurance) in [place]: ° °Organization         Address  Phone   Notes  °Sickle Cell Patients, [facility] [address], [place] (336) 832-1970   °[facility] [address], [place] (336) 832-4400   °[facility] ° [address], [place] (336) 992-4800   °[facility]/Dr. Osei-Bonsu ° [address] [place] [address], [place] or [address], [place] (336) [phone] Phone number for both [place] and [place] locations is the same.  °[facility] [address], [place] (336) 299-0000   °[facility] [address] [place] [address], [place] or [address] (336) 852-7530 °(336) 878-2260   °[facility] [address], [place] (336) 350-1642, phone; (336) 294-5005, fax Sees patients 1st and 3rd Saturday of every month.  Must not qualify for public or private insurance (i.e.  Medicaid, Medicare, [place] Health Choice, Veterans' Benefits) • Household income should be no more than 200% of the poverty level •The clinic cannot treat you if you are pregnant or think you are pregnant • Sexually transmitted diseases are not treated at the clinic.  ° ° °Dental Care: °Organization         Address  Phone  Notes  °[facility] [address], [place] (336) 641-6152 Accepts children up to age 21 who are enrolled in Medicaid or [place] Health Choice; pregnant women with a Medicaid card; and children who have applied for Medicaid or  Health Choice, but were declined, whose parents can pay a reduced fee at time   of service.  °[facility] [place]  [address], [place] (336) 641-7733 Accepts children up to age 21 who are enrolled in Medicaid or [place] Health Choice; pregnant women with a Medicaid card; and children who have applied for Medicaid or [place] Health Choice, but were declined, whose parents can pay a reduced fee at time of service.  °[facility] ° [address], [place] (336) 641-4533 Patients are seen by appointment only. Walk-ins are not accepted. [facility] will see patients 18 years of age and older. °Monday - Tuesday (8am-5pm) °Most Wednesdays (8:30-5pm) °$30 per visit, cash only  °[facility] ° [address], [place] (336) 641-4533 Patients are seen by appointment only. Walk-ins are not accepted. [facility] will see patients 18 years of age and older. °One Wednesday Evening (Monthly: Volunteer Based).  $30 per visit, cash only  °[facility]  (919) 537-3737 for adults; Children under age 4, call Graduate Pediatric Dentistry at ([phone]) [phone]. Children aged 4-14, please call ([phone]) [phone] to request a pediatric application. ° Dental services are provided in all areas of dental care including fillings,  crowns and bridges, complete and partial dentures, implants, gum treatment, root canals, and extractions. Preventive care is also provided. Treatment is provided to both adults and children. °Patients are selected via a lottery and there is often a waiting list. °  °[facility] [address], °[place] ° (336) 763-8833 [email or web address] °  °[facility] [address], [place], [place] (336)723-1848, Ext. 123 Second and Fourth Thursday of each month, opens at 6:30 AM; Clinic ends at 9 AM.  Patients are seen on a first-come first-served basis, and a limited number are seen during each clinic.  ° °[facility] ° [address], [place], [place] (336) 723-7904   Eligibility Requirements °You must have lived in [place], [place], or [place] counties for at least the last three months. °  You cannot be eligible for state or federal sponsored healthcare insurance, including Veterans Administration, Medicaid, or Medicare. °  You generally cannot be eligible for healthcare insurance through your employer.  °  How to apply: °Eligibility screenings are held every Tuesday and Wednesday afternoon from 1:00 pm until 4:00 pm. You do not need an appointment for the interview!  °[facility] [address], [place], [place] 336-631-2330   °[facility]  336-342-8273   °[facility]  336-703-3100   °[facility]  336-570-6415   ° °Behavioral Health Resources in the Community: °Intensive Outpatient Programs °Organization         Address  Phone  Notes  °[facility] [address], [place], [place] 336-878-6098   °[facility] [address], [place], [place] 336-832-9800   °[facility] [address], [place], [place] ° 336-882-2125   °[facility] [address],  °[place], [place] 1-800-853-5163 or 336-641-4981   °Substance Abuse  Resources °Organization         Address  Phone  Notes  °[facility]  336-882-2125   °[facility]  336-784-9470   °[facility]  336-285-9073   °[facility]  336-845-3988   °Residential & Outpatient Substance Abuse Program  1-800-659-3381   °Psychological Services °Organization         Address    Phone  Notes  °[facility]  336- 832-9600   °[facility]  336- 378-7881   °[facility] [address], [place] 1-800-853-5163 or 336-641-4981   ° °Mobile Crisis Teams °Organization         Address  Phone  Notes  °[facility], Mobile Crisis Care Unit  1-877-626-1772   °Assertive °[facility] ° [address] [place], [place] 336-834-9664   °Sharon DeEsch [address] °[place] 336-554-5454   ° °Self-Help/Support Groups °Organization         Address  Phone             Notes  °[facility] - variety of support groups  336- 373-1402 Call for more information  °Narcotics Anonymous (NA), [facility] [address], °[place]  2 meetings at this location  ° °Residential Treatment Programs °Organization         Address  Phone  Notes  °[facility] [address],    °[place]  1-866-801-8205   °[facility] ° [address], [place], [place] 704-293-8524   °[facility] [address], [place] 336-845-3988 Admissions: 8am-3pm M-F  °[facility] [address]    °[place], [place] 336-841-1104   °[facility] [address], [place], [place] 336-379-7146   °[facility] [address]  °[place], [place] 336-285-9073   °[facility] - Intensive Outpatient [address], , [place] 336-852-3033   °[facility] [address]  °[place], [place] 1-877-615-2722 or 336-784-9470   °[facility] [address] [place], [place] 336-227-7417 Accepts Medicaid  °[facility] [address]  ° [place] 1-800-659-3381 Substance Abuse/Addiction Treatment  ° °[place] Behavioral Health Resources °Organization         Address  Phone  Notes  °[facility]  (888) 581-9988   °Julie Brannon, PhD [address] [place], [place]   (336) 349-5553 or (336) 951-0000   °[facility]   [address] °[place], [place] (336) 349-4454   °[facility] [address], [place], [place] (336) 342-8316 Insurance/Medicaid/sponsorship through [facility]  °[facility] [address]                                    [place], [place] (336) 342-8316 Therapy/tele-psych/case  °[facility] [address]  ° [place], [place] (336) 349-2233    °Dr. Arfeen  (336) 349-4544   °[facility] [place]  [employer] [place] [facility] 1) [address], [place] °2) [address], [place] °3)  [address], [place] (336) 349-3220 °(336) 342-7768 ° °(336) 342-8140   °[place] Child Abuse Hot[facility] (336) 342-1394 or (336) 342-3537 (After Hours)    ° ° ° °

## 2013-07-13 NOTE — ED Provider Notes (Signed)
Medical screening examination/treatment/procedure(s) were performed by non-physician practitioner and as supervising physician I was immediately available for consultation/collaboration.  EKG Interpretation   None         Gilda Creasehristopher J. Junetta Hearn, MD 07/13/13 1113

## 2013-07-13 NOTE — ED Notes (Signed)
Per pt has had lower abd pain related to irregular stools. Pt reports worsening pain over the past month. Pt reports continuously having BMs either loose or "rabbit turds" with mucus around BM. Pt denies bleeding from rectum.

## 2013-07-13 NOTE — ED Notes (Signed)
Unsuccessful IV attempt x2. P Dowd notified, will attempt IV insertion.

## 2013-07-15 LAB — GC/CHLAMYDIA PROBE AMP
CT Probe RNA: NEGATIVE
GC PROBE AMP APTIMA: NEGATIVE

## 2013-07-23 ENCOUNTER — Emergency Department (HOSPITAL_COMMUNITY)
Admission: EM | Admit: 2013-07-23 | Discharge: 2013-07-23 | Disposition: A | Discharge status: Home / Self Care | Payer: No Typology Code available for payment source | Attending: Emergency Medicine | Admitting: Emergency Medicine

## 2013-07-23 ENCOUNTER — Encounter (HOSPITAL_COMMUNITY): Payer: Self-pay | Admitting: Emergency Medicine

## 2013-07-23 ENCOUNTER — Emergency Department (HOSPITAL_COMMUNITY): Payer: No Typology Code available for payment source

## 2013-07-23 DIAGNOSIS — R05 Cough: Secondary | ICD-10-CM

## 2013-07-23 DIAGNOSIS — R6889 Other general symptoms and signs: Secondary | ICD-10-CM

## 2013-07-23 DIAGNOSIS — R000 Tachycardia, unspecified: Secondary | ICD-10-CM | POA: Insufficient documentation

## 2013-07-23 DIAGNOSIS — F17200 Nicotine dependence, unspecified, uncomplicated: Secondary | ICD-10-CM | POA: Insufficient documentation

## 2013-07-23 DIAGNOSIS — R059 Cough, unspecified: Secondary | ICD-10-CM

## 2013-07-23 DIAGNOSIS — Z87442 Personal history of urinary calculi: Secondary | ICD-10-CM | POA: Insufficient documentation

## 2013-07-23 DIAGNOSIS — Z8679 Personal history of other diseases of the circulatory system: Secondary | ICD-10-CM | POA: Insufficient documentation

## 2013-07-23 DIAGNOSIS — J40 Bronchitis, not specified as acute or chronic: Secondary | ICD-10-CM | POA: Insufficient documentation

## 2013-07-23 DIAGNOSIS — R110 Nausea: Secondary | ICD-10-CM | POA: Insufficient documentation

## 2013-07-23 DIAGNOSIS — IMO0001 Reserved for inherently not codable concepts without codable children: Secondary | ICD-10-CM | POA: Insufficient documentation

## 2013-07-23 DIAGNOSIS — Z87448 Personal history of other diseases of urinary system: Secondary | ICD-10-CM | POA: Insufficient documentation

## 2013-07-23 DIAGNOSIS — Z880 Allergy status to penicillin: Secondary | ICD-10-CM | POA: Insufficient documentation

## 2013-07-23 DIAGNOSIS — Z79899 Other long term (current) drug therapy: Secondary | ICD-10-CM | POA: Insufficient documentation

## 2013-07-23 MED ORDER — ONDANSETRON HCL 4 MG/2ML IJ SOLN
4.0000 mg | Freq: Once | INTRAMUSCULAR | Status: AC
Start: 2013-07-23 — End: 2013-07-23
  Administered 2013-07-23: 4 mg via INTRAVENOUS
  Filled 2013-07-23: qty 2

## 2013-07-23 MED ORDER — SODIUM CHLORIDE 0.9 % IV BOLUS (SEPSIS)
1000.0000 mL | Freq: Once | INTRAVENOUS | Status: AC
  Administered 2013-07-23: 1000 mL via INTRAVENOUS

## 2013-07-23 MED ORDER — HYDROCOD POLST-CHLORPHEN POLST 10-8 MG/5ML PO LQCR: 5.0000 mL | Freq: Two times a day (BID) | ORAL | Status: DC | PRN

## 2013-07-23 MED ORDER — IPRATROPIUM BROMIDE 0.02 % IN SOLN
0.5000 mg | Freq: Once | RESPIRATORY_TRACT | Status: AC
  Administered 2013-07-23: 0.5 mg via RESPIRATORY_TRACT
  Filled 2013-07-23: qty 2.5

## 2013-07-23 MED ORDER — ALBUTEROL SULFATE (2.5 MG/3ML) 0.083% IN NEBU
5.0000 mg | INHALATION_SOLUTION | Freq: Once | RESPIRATORY_TRACT | Status: AC
  Administered 2013-07-23: 5 mg via RESPIRATORY_TRACT
  Filled 2013-07-23: qty 6

## 2013-07-23 NOTE — Progress Notes (Signed)
[facility] provided pt with a list of primary care resources.  °

## 2013-07-23 NOTE — ED Provider Notes (Signed)
CSN: [id number]     Arrival date & time 07/23/13  1153 History   First MD Initiated Contact with Patient 07/23/13 1246     Chief Complaint  Patient presents with  . Cough   (Consider location/radiation/quality/duration/timing/severity/associated sxs/prior Treatment) HPI Comments: Pt is a 43 y/o female with a PMHx of chronic bronchitis who presents to the ED complaining of worsening cough x 2 days. Pt states she was diagnosed with a sinus infection 2 days ago, began taking azithromycin. Since then she has had worsening cough, productive with yellow mucus, body aches, nausea, subjective fever and chills. She is a daily smoker but "has not been able to smoke for the past few days". She stays at the shelter and is around sick people, however unsure of their symptoms. Admits to chest congestion and tightness, no relief with inhalers. Denies vomiting, abdominal pain, chest pain or diarrhea.  Patient is a 43 y.o. female presenting with cough. The history is provided by the patient.  Cough Associated symptoms: fever, myalgias and wheezing     Past Medical History  Diagnosis Date  . Chronic bronchitis   . Renal disorder   . Kidney stone   . Migraine    Past Surgical History  Procedure Laterality Date  . Cholecystectomy    . Abdominal hysterectomy     No family history on file. History  Substance Use Topics  . Smoking status: Current Every Day Smoker -- 0.50 packs/day    Types: Cigarettes  . Smokeless tobacco: Not on file  . Alcohol Use: No   OB History   Grav Para Term Preterm Abortions TAB SAB Ect Mult Living                 Review of Systems  Constitutional: Positive for fever.  HENT: Positive for congestion.   Respiratory: Positive for cough, chest tightness and wheezing.   Gastrointestinal: Positive for nausea.  Musculoskeletal: Positive for arthralgias and myalgias.  All other systems reviewed and are negative.    Allergies  Bactrim and Penicillins  Home Medications    Current Outpatient Rx  Name  Route  Sig  Dispense  Refill  . acetaminophen (TYLENOL) 160 MG/5ML suspension   Oral   Take 320 mg by mouth every 6 (six) hours as needed (pain).         Marland Kitchen albuterol (PROVENTIL HFA;VENTOLIN HFA) 108 (90 BASE) MCG/ACT inhaler   Inhalation   Inhale 2 puffs into the lungs every 6 (six) hours as needed for wheezing or shortness of breath.         Marland Kitchen amitriptyline (ELAVIL) 50 MG tablet   Oral   Take 50 mg by mouth at bedtime.         . benzonatate (TESSALON) 100 MG capsule   Oral   Take 100 mg by mouth 3 (three) times daily as needed for cough (cough).         . citalopram (CELEXA) 40 MG tablet   Oral   Take 40 mg by mouth daily.         Marland Kitchen dicyclomine (BENTYL) 20 MG tablet   Oral   Take 1 tablet (20 mg total) by mouth 2 (two) times daily.   20 tablet   0   . hydrOXYzine (ATARAX/VISTARIL) 50 MG tablet   Oral   Take 50 mg by mouth 2 (two) times daily.         Marland Kitchen ibuprofen (ADVIL,MOTRIN) 800 MG tablet   Oral   Take 800 mg  by mouth 3 (three) times daily.         Marland Kitchen. loratadine (CLARITIN) 10 MG tablet   Oral   Take 10 mg by mouth daily.         . chlorpheniramine-HYDROcodone (TUSSIONEX PENNKINETIC ER) 10-8 MG/5ML LQCR   Oral   Take 5 mLs by mouth every 12 (twelve) hours as needed for cough.   115 mL   0    BP 119/86  Pulse 69  Temp(Src) 99 F (37.2 C) (Oral)  Resp 19  SpO2 95% Physical Exam  Nursing note and vitals reviewed. Constitutional: She is oriented to person, place, and time. She appears well-developed and well-nourished. No distress.  HENT:  Head: Normocephalic and atraumatic.  Nose: Mucosal edema present. Right sinus exhibits maxillary sinus tenderness and frontal sinus tenderness. Left sinus exhibits maxillary sinus tenderness and frontal sinus tenderness.  Mouth/Throat: Oropharynx is clear and moist.  Eyes: Conjunctivae are normal.  Neck: Normal range of motion. Neck supple.  Cardiovascular: Regular rhythm,  normal heart sounds and intact distal pulses.  Tachycardia present.   Pulmonary/Chest: Effort normal.  Scattered wheezes and ronchi. Harsh cough present.  Abdominal: Soft. Bowel sounds are normal. There is no tenderness.  Musculoskeletal: Normal range of motion. She exhibits no edema.  Neurological: She is alert and oriented to person, place, and time.  Skin: Skin is warm and dry. She is not diaphoretic.  Clammy  Psychiatric: She has a normal mood and affect. Her behavior is normal.    ED Course  Procedures (including critical care time) Labs Review Labs Reviewed - No data to display Imaging Review Dg Chest 2 View  07/23/2013   CLINICAL DATA:  Cough, congestion  EXAM: CHEST  2 VIEW  COMPARISON:  12/24/2007  FINDINGS: Cardiomediastinal silhouette is stable. No acute infiltrate or pleural effusion. No pulmonary edema. Bony thorax is unremarkable.  IMPRESSION: No active cardiopulmonary disease.   Electronically Signed   By: Natasha MeadLiviu  Pop M.D.   On: 07/23/2013 13:33    EKG Interpretation   None       MDM   1. Flu-like symptoms   2. Cough   3. Bronchitis     Pt presenting with cough, fever, body aches. On azithromycin for sinus infection. She appears in NAD. Febrile at 99.4, tachy 123. No respiratory distress, O2 sat 95% on RA. Harsh cough present, scattered wheezes and ronchi. Plan to give DuoNeb, IV fluids. CXR pending. 2:49 PM Patient reports she is feeling better after his pain IV fluid and DuoNeb. Chest x-ray clear. She is stable for discharge, discharged with Tussionex. Advised her to continue azithromycin. Discussed symptomatic treatment. Return precautions given. Patient states understanding of treatment care plan and is agreeable.   Trevor MaceRobyn M Albert, PA-C 07/23/13 [phone]

## 2013-07-23 NOTE — Discharge Instructions (Signed)
Rest, stay well hydrated, use inhaler every 4-6 hours as needed. Take tylenol alternated with ibuprofen every 4-6 hours for fever. Use Tussionex for severe cough. No driving or operating heavy machinery while taking this drug as it may cause drowsiness.  Influenza, Adult Influenza ("the flu") is a viral infection of the respiratory tract. It occurs more often in winter months because people spend more time in close contact with one another. Influenza can make you feel very sick. Influenza easily spreads from person to person (contagious). CAUSES  Influenza is caused by a virus that infects the respiratory tract. You can catch the virus by breathing in droplets from an infected person's cough or sneeze. You can also catch the virus by touching something that was recently contaminated with the virus and then touching your mouth, nose, or eyes. SYMPTOMS  Symptoms typically last 4 to 10 days and may include:  Fever.  Chills.  Headache, body aches, and muscle aches.  Sore throat.  Chest discomfort and cough.  Poor appetite.  Weakness or feeling tired.  Dizziness.  Nausea or vomiting. DIAGNOSIS  Diagnosis of influenza is often made based on your history and a physical exam. A nose or throat swab test can be done to confirm the diagnosis. RISKS AND COMPLICATIONS You may be at risk for a more severe case of influenza if you smoke cigarettes, have diabetes, have chronic heart disease (such as heart failure) or lung disease (such as asthma), or if you have a weakened immune system. Elderly people and pregnant women are also at risk for more serious infections. The most common complication of influenza is a lung infection (pneumonia). Sometimes, this complication can require emergency medical care and may be life-threatening. PREVENTION  An annual influenza vaccination (flu shot) is the best way to avoid getting influenza. An annual flu shot is now routinely recommended for all adults in the  [place] TREATMENT  In mild cases, influenza goes away on its own. Treatment is directed at relieving symptoms. For more severe cases, your caregiver may prescribe antiviral medicines to shorten the sickness. Antibiotic medicines are not effective, because the infection is caused by a virus, not by bacteria. HOME CARE INSTRUCTIONS  Only take over-the-counter or prescription medicines for pain, discomfort, or fever as directed by your caregiver.  Use a cool mist humidifier to make breathing easier.  Get plenty of rest until your temperature returns to normal. This usually takes 3 to 4 days.  Drink enough fluids to keep your urine clear or pale yellow.  Cover your mouth and nose when coughing or sneezing, and wash your hands well to avoid spreading the virus.  Stay home from work or school until your fever has been gone for at least 1 full day. SEEK MEDICAL CARE IF:   You have chest pain or a deep cough that worsens or produces more mucus.  You have nausea, vomiting, or diarrhea. SEEK IMMEDIATE MEDICAL CARE IF:   You have difficulty breathing, shortness of breath, or your skin or nails turn bluish.  You have severe neck pain or stiffness.  You have a severe headache, facial pain, or earache.  You have a worsening or recurring fever.  You have nausea or vomiting that cannot be controlled. MAKE SURE YOU:  Understand these instructions.  Will watch your condition.  Will get help right away if you are not doing well or get worse. Document Released: 06/24/2000 Document Revised: 12/27/2011 Document Reviewed: 09/26/2011 [facility] Patient Information 2014 [place], [place].  Upper Respiratory  Infection, Adult An upper respiratory infection (URI) is also sometimes known as the common cold. The upper respiratory tract includes the nose, sinuses, throat, trachea, and bronchi. Bronchi are the airways leading to the lungs. Most people improve within 1 week, but symptoms can last up to 2 weeks. A  residual cough may last even longer.  CAUSES Many different viruses can infect the tissues lining the upper respiratory tract. The tissues become irritated and inflamed and often become very moist. Mucus production is also common. A cold is contagious. You can easily spread the virus to others by oral contact. This includes kissing, sharing a glass, coughing, or sneezing. Touching your mouth or nose and then touching a surface, which is then touched by another person, can also spread the virus. SYMPTOMS  Symptoms typically develop 1 to 3 days after you come in contact with a cold virus. Symptoms vary from person to person. They may include:  Runny nose.  Sneezing.  Nasal congestion.  Sinus irritation.  Sore throat.  Loss of voice (laryngitis).  Cough.  Fatigue.  Muscle aches.  Loss of appetite.  Headache.  Low-grade fever. DIAGNOSIS  You might diagnose your own cold based on familiar symptoms, since most people get a cold 2 to 3 times a year. Your caregiver can confirm this based on your exam. Most importantly, your caregiver can check that your symptoms are not due to another disease such as strep throat, sinusitis, pneumonia, asthma, or epiglottitis. Blood tests, throat tests, and X-rays are not necessary to diagnose a common cold, but they may sometimes be helpful in excluding other more serious diseases. Your caregiver will decide if any further tests are required. RISKS AND COMPLICATIONS  You may be at risk for a more severe case of the common cold if you smoke cigarettes, have chronic heart disease (such as heart failure) or lung disease (such as asthma), or if you have a weakened immune system. The very young and very old are also at risk for more serious infections. Bacterial sinusitis, middle ear infections, and bacterial pneumonia can complicate the common cold. The common cold can worsen asthma and chronic obstructive pulmonary disease (COPD). Sometimes, these complications  can require emergency medical care and may be life-threatening. PREVENTION  The best way to protect against getting a cold is to practice good hygiene. Avoid oral or hand contact with people with cold symptoms. Wash your hands often if contact occurs. There is no clear evidence that vitamin C, vitamin E, echinacea, or exercise reduces the chance of developing a cold. However, it is always recommended to get plenty of rest and practice good nutrition. TREATMENT  Treatment is directed at relieving symptoms. There is no cure. Antibiotics are not effective, because the infection is caused by a virus, not by bacteria. Treatment may include:  Increased fluid intake. Sports drinks offer valuable electrolytes, sugars, and fluids.  Breathing heated mist or steam (vaporizer or shower).  Eating chicken soup or other clear broths, and maintaining good nutrition.  Getting plenty of rest.  Using gargles or lozenges for comfort.  Controlling fevers with ibuprofen or acetaminophen as directed by your caregiver.  Increasing usage of your inhaler if you have asthma. Zinc gel and zinc lozenges, taken in the first 24 hours of the common cold, can shorten the duration and lessen the severity of symptoms. Pain medicines may help with fever, muscle aches, and throat pain. A variety of non-prescription medicines are available to treat congestion and runny nose. Your caregiver can  make recommendations and may suggest nasal or lung inhalers for other symptoms.  HOME CARE INSTRUCTIONS   Only take over-the-counter or prescription medicines for pain, discomfort, or fever as directed by your caregiver.  Use a warm mist humidifier or inhale steam from a shower to increase air moisture. This may keep secretions moist and make it easier to breathe.  Drink enough water and fluids to keep your urine clear or pale yellow.  Rest as needed.  Return to work when your temperature has returned to normal or as your caregiver  advises. You may need to stay home longer to avoid infecting others. You can also use a face mask and careful hand washing to prevent spread of the virus. SEEK MEDICAL CARE IF:   After the first few days, you feel you are getting worse rather than better.  You need your caregiver's advice about medicines to control symptoms.  You develop chills, worsening shortness of breath, or brown or red sputum. These may be signs of pneumonia.  You develop yellow or brown nasal discharge or pain in the face, especially when you bend forward. These may be signs of sinusitis.  You develop a fever, swollen neck glands, pain with swallowing, or white areas in the back of your throat. These may be signs of strep throat. SEEK IMMEDIATE MEDICAL CARE IF:   You have a fever.  You develop severe or persistent headache, ear pain, sinus pain, or chest pain.  You develop wheezing, a prolonged cough, cough up blood, or have a change in your usual mucus (if you have chronic lung disease).  You develop sore muscles or a stiff neck. Document Released: 12/21/2000 Document Revised: 09/19/2011 Document Reviewed: 10/29/2010 [facility] Patient Information 2014 [place]

## 2013-07-23 NOTE — ED Notes (Signed)
Pt reports flu like symptoms with cough and nausea. Pt reports sharp pain in her back and chest when coughing. Pt sts she stays at shelter and has been around seek people.

## 2013-07-25 ENCOUNTER — Encounter (HOSPITAL_COMMUNITY): Payer: Self-pay | Admitting: Emergency Medicine

## 2013-07-25 ENCOUNTER — Inpatient Hospital Stay (HOSPITAL_COMMUNITY)
Admission: EM | Admit: 2013-07-25 | Discharge: 2013-07-29 | DRG: 194 | Disposition: A | Discharge status: Home / Self Care | Payer: No Typology Code available for payment source | Attending: Internal Medicine | Admitting: Internal Medicine

## 2013-07-25 ENCOUNTER — Emergency Department (HOSPITAL_COMMUNITY): Payer: No Typology Code available for payment source

## 2013-07-25 DIAGNOSIS — R932 Abnormal findings on diagnostic imaging of liver and biliary tract: Secondary | ICD-10-CM

## 2013-07-25 DIAGNOSIS — F329 Major depressive disorder, single episode, unspecified: Secondary | ICD-10-CM | POA: Diagnosis present

## 2013-07-25 DIAGNOSIS — R740 Nonspecific elevation of levels of transaminase and lactic acid dehydrogenase [LDH]: Secondary | ICD-10-CM

## 2013-07-25 DIAGNOSIS — F17200 Nicotine dependence, unspecified, uncomplicated: Secondary | ICD-10-CM | POA: Diagnosis present

## 2013-07-25 DIAGNOSIS — R7401 Elevation of levels of liver transaminase levels: Secondary | ICD-10-CM

## 2013-07-25 DIAGNOSIS — Z5900 Homelessness unspecified: Secondary | ICD-10-CM

## 2013-07-25 DIAGNOSIS — F3289 Other specified depressive episodes: Secondary | ICD-10-CM | POA: Diagnosis present

## 2013-07-25 DIAGNOSIS — D72819 Decreased white blood cell count, unspecified: Secondary | ICD-10-CM | POA: Diagnosis present

## 2013-07-25 DIAGNOSIS — F411 Generalized anxiety disorder: Secondary | ICD-10-CM | POA: Diagnosis present

## 2013-07-25 DIAGNOSIS — K589 Irritable bowel syndrome without diarrhea: Secondary | ICD-10-CM | POA: Diagnosis present

## 2013-07-25 DIAGNOSIS — Z720 Tobacco use: Secondary | ICD-10-CM

## 2013-07-25 DIAGNOSIS — J42 Unspecified chronic bronchitis: Secondary | ICD-10-CM

## 2013-07-25 DIAGNOSIS — R0902 Hypoxemia: Secondary | ICD-10-CM

## 2013-07-25 DIAGNOSIS — J449 Chronic obstructive pulmonary disease, unspecified: Secondary | ICD-10-CM | POA: Diagnosis present

## 2013-07-25 DIAGNOSIS — J189 Pneumonia, unspecified organism: Principal | ICD-10-CM

## 2013-07-25 DIAGNOSIS — R319 Hematuria, unspecified: Secondary | ICD-10-CM

## 2013-07-25 DIAGNOSIS — R112 Nausea with vomiting, unspecified: Secondary | ICD-10-CM

## 2013-07-25 DIAGNOSIS — R339 Retention of urine, unspecified: Secondary | ICD-10-CM | POA: Diagnosis present

## 2013-07-25 DIAGNOSIS — K219 Gastro-esophageal reflux disease without esophagitis: Secondary | ICD-10-CM | POA: Diagnosis present

## 2013-07-25 DIAGNOSIS — J329 Chronic sinusitis, unspecified: Secondary | ICD-10-CM

## 2013-07-25 DIAGNOSIS — R7402 Elevation of levels of lactic acid dehydrogenase (LDH): Secondary | ICD-10-CM

## 2013-07-25 DIAGNOSIS — Z79899 Other long term (current) drug therapy: Secondary | ICD-10-CM

## 2013-07-25 DIAGNOSIS — N289 Disorder of kidney and ureter, unspecified: Secondary | ICD-10-CM

## 2013-07-25 DIAGNOSIS — R109 Unspecified abdominal pain: Secondary | ICD-10-CM

## 2013-07-25 DIAGNOSIS — B1920 Unspecified viral hepatitis C without hepatic coma: Secondary | ICD-10-CM

## 2013-07-25 DIAGNOSIS — E871 Hypo-osmolality and hyponatremia: Secondary | ICD-10-CM | POA: Diagnosis present

## 2013-07-25 DIAGNOSIS — J4489 Other specified chronic obstructive pulmonary disease: Secondary | ICD-10-CM | POA: Diagnosis present

## 2013-07-25 HISTORY — DX: Unspecified viral hepatitis C without hepatic coma: B19.20

## 2013-07-25 HISTORY — DX: Chronic obstructive pulmonary disease, unspecified: J44.9

## 2013-07-25 HISTORY — DX: Low back pain: M54.5

## 2013-07-25 HISTORY — DX: Fracture of unspecified parts of lumbosacral spine and pelvis, initial encounter for closed fracture: S32.9XXA

## 2013-07-25 HISTORY — DX: Other chronic pain: G89.29

## 2013-07-25 HISTORY — DX: Shortness of breath: R06.02

## 2013-07-25 HISTORY — DX: Pneumonia, unspecified organism: J18.9

## 2013-07-25 HISTORY — DX: Anxiety disorder, unspecified: F41.9

## 2013-07-25 HISTORY — DX: Gastro-esophageal reflux disease without esophagitis: K21.9

## 2013-07-25 HISTORY — DX: Low back pain, unspecified: M54.50

## 2013-07-25 HISTORY — DX: Depression, unspecified: F32.A

## 2013-07-25 HISTORY — DX: Major depressive disorder, single episode, unspecified: F32.9

## 2013-07-25 LAB — CBC WITH DIFFERENTIAL/PLATELET
BASOS ABS: 0 10*3/uL (ref 0.0–0.1)
BASOS PCT: 1 % (ref 0–1)
Eosinophils Absolute: 0.1 10*3/uL (ref 0.0–0.7)
Eosinophils Relative: 1 % (ref 0–5)
HCT: 35.1 % — ABNORMAL LOW (ref 36.0–46.0)
Hemoglobin: 11.8 g/dL — ABNORMAL LOW (ref 12.0–15.0)
LYMPHS PCT: 16 % (ref 12–46)
Lymphs Abs: 0.9 10*3/uL (ref 0.7–4.0)
MCH: 31 pg (ref 26.0–34.0)
MCHC: 33.6 g/dL (ref 30.0–36.0)
MCV: 92.1 fL (ref 78.0–100.0)
Monocytes Absolute: 0.4 10*3/uL (ref 0.1–1.0)
Monocytes Relative: 7 % (ref 3–12)
NEUTROS PCT: 75 % (ref 43–77)
Neutro Abs: 4 10*3/uL (ref 1.7–7.7)
PLATELETS: 201 10*3/uL (ref 150–400)
RBC: 3.81 MIL/uL — ABNORMAL LOW (ref 3.87–5.11)
RDW: 13.3 % (ref 11.5–15.5)
WBC: 5.3 10*3/uL (ref 4.0–10.5)

## 2013-07-25 LAB — LIPASE, BLOOD: Lipase: 20 U/L (ref 11–59)

## 2013-07-25 LAB — URINALYSIS, ROUTINE W REFLEX MICROSCOPIC
BILIRUBIN URINE: NEGATIVE
Glucose, UA: NEGATIVE mg/dL
Ketones, ur: NEGATIVE mg/dL
Leukocytes, UA: NEGATIVE
Nitrite: NEGATIVE
PROTEIN: NEGATIVE mg/dL
Specific Gravity, Urine: 1.015 (ref 1.005–1.030)
Urobilinogen, UA: 0.2 mg/dL (ref 0.0–1.0)
pH: 7.5 (ref 5.0–8.0)

## 2013-07-25 LAB — RAPID URINE DRUG SCREEN, HOSP PERFORMED
Amphetamines: NOT DETECTED
Barbiturates: NOT DETECTED
Benzodiazepines: NOT DETECTED
Cocaine: NOT DETECTED
Opiates: NOT DETECTED
Tetrahydrocannabinol: NOT DETECTED

## 2013-07-25 LAB — INFLUENZA PANEL BY PCR (TYPE A & B)
H1N1FLUPCR: NOT DETECTED
Influenza A By PCR: NEGATIVE
Influenza B By PCR: NEGATIVE

## 2013-07-25 LAB — COMPREHENSIVE METABOLIC PANEL
ALT: 154 U/L — AB (ref 0–35)
AST: 268 U/L — AB (ref 0–37)
Albumin: 3.8 g/dL (ref 3.5–5.2)
Alkaline Phosphatase: 164 U/L — ABNORMAL HIGH (ref 39–117)
BILIRUBIN TOTAL: 0.7 mg/dL (ref 0.3–1.2)
BUN: 8 mg/dL (ref 6–23)
CHLORIDE: 97 meq/L (ref 96–112)
CO2: 23 mEq/L (ref 19–32)
CREATININE: 0.68 mg/dL (ref 0.50–1.10)
Calcium: 8.9 mg/dL (ref 8.4–10.5)
GFR calc Af Amer: >90 mL/min (ref >90)
GFR calc non Af Amer: >90 mL/min (ref >90)
Glucose, Bld: 103 mg/dL — ABNORMAL HIGH (ref 70–99)
POTASSIUM: 4 meq/L (ref 3.7–5.3)
SODIUM: 133 meq/L — AB (ref 137–147)
Total Protein: 7.6 g/dL (ref 6.0–8.3)

## 2013-07-25 LAB — LACTIC ACID, PLASMA: Lactic Acid, Venous: 0.9 mmol/L (ref 0.5–2.2)

## 2013-07-25 LAB — ACETAMINOPHEN LEVEL

## 2013-07-25 LAB — URINE MICROSCOPIC-ADD ON

## 2013-07-25 LAB — SALICYLATE LEVEL

## 2013-07-25 LAB — PROTIME-INR
INR: 0.97 (ref 0.00–1.49)
Prothrombin Time: 12.7 seconds (ref 11.6–15.2)

## 2013-07-25 LAB — STREP PNEUMONIAE URINARY ANTIGEN: Strep Pneumo Urinary Antigen: NEGATIVE

## 2013-07-25 MED ORDER — HYDROCOD POLST-CHLORPHEN POLST 10-8 MG/5ML PO LQCR
5.0000 mL | Freq: Two times a day (BID) | ORAL | Status: DC | PRN
  Administered 2013-07-25 – 2013-07-26 (×3): 5 mL via ORAL
  Filled 2013-07-25 (×3): qty 5

## 2013-07-25 MED ORDER — SODIUM CHLORIDE 0.9 % IV BOLUS (SEPSIS)
1000.0000 mL | Freq: Once | INTRAVENOUS | Status: AC
  Administered 2013-07-25: 1000 mL via INTRAVENOUS

## 2013-07-25 MED ORDER — ONDANSETRON HCL 4 MG/2ML IJ SOLN
4.0000 mg | Freq: Three times a day (TID) | INTRAMUSCULAR | Status: DC | PRN
  Administered 2013-07-25 – 2013-07-27 (×5): 4 mg via INTRAVENOUS
  Filled 2013-07-25 (×5): qty 2

## 2013-07-25 MED ORDER — LEVOFLOXACIN IN D5W 750 MG/150ML IV SOLN
750.0000 mg | Freq: Once | INTRAVENOUS | Status: AC
  Administered 2013-07-25: 750 mg via INTRAVENOUS
  Filled 2013-07-25: qty 150

## 2013-07-25 MED ORDER — HYDROMORPHONE HCL PF 1 MG/ML IJ SOLN
1.0000 mg | Freq: Once | INTRAMUSCULAR | Status: AC
Start: 2013-07-25 — End: 2013-07-25
  Administered 2013-07-25: 1 mg via INTRAVENOUS
  Filled 2013-07-25: qty 1

## 2013-07-25 MED ORDER — ALBUTEROL SULFATE (2.5 MG/3ML) 0.083% IN NEBU
2.5000 mg | INHALATION_SOLUTION | Freq: Four times a day (QID) | RESPIRATORY_TRACT | Status: DC | PRN
  Administered 2013-07-26: 2.5 mg via RESPIRATORY_TRACT
  Filled 2013-07-25: qty 3

## 2013-07-25 MED ORDER — ALBUTEROL SULFATE (2.5 MG/3ML) 0.083% IN NEBU
5.0000 mg | INHALATION_SOLUTION | Freq: Once | RESPIRATORY_TRACT | Status: AC
  Administered 2013-07-25: 5 mg via RESPIRATORY_TRACT
  Filled 2013-07-25: qty 6

## 2013-07-25 MED ORDER — IBUPROFEN 400 MG PO TABS
400.0000 mg | ORAL_TABLET | Freq: Three times a day (TID) | ORAL | Status: DC | PRN
  Administered 2013-07-25 – 2013-07-26 (×2): 400 mg via ORAL
  Filled 2013-07-25 (×3): qty 1

## 2013-07-25 MED ORDER — HYDROMORPHONE HCL PF 1 MG/ML IJ SOLN
1.0000 mg | Freq: Once | INTRAMUSCULAR | Status: AC
  Administered 2013-07-25: 1 mg via INTRAVENOUS
  Filled 2013-07-25: qty 1

## 2013-07-25 MED ORDER — HEPARIN SODIUM (PORCINE) 5000 UNIT/ML IJ SOLN
5000.0000 [IU] | Freq: Three times a day (TID) | INTRAMUSCULAR | Status: DC
  Filled 2013-07-25 (×14): qty 1

## 2013-07-25 MED ORDER — LEVOFLOXACIN IN D5W 750 MG/150ML IV SOLN
750.0000 mg | INTRAVENOUS | Status: DC
  Administered 2013-07-26: 750 mg via INTRAVENOUS
  Filled 2013-07-25: qty 150

## 2013-07-25 MED ORDER — ALBUTEROL SULFATE HFA 108 (90 BASE) MCG/ACT IN AERS: 2.0000 | INHALATION_SPRAY | Freq: Four times a day (QID) | RESPIRATORY_TRACT | Status: DC | PRN

## 2013-07-25 MED ORDER — IOHEXOL 300 MG/ML  SOLN: 25.0000 mL | Freq: Once | INTRAMUSCULAR | Status: DC | PRN

## 2013-07-25 MED ORDER — LORATADINE 10 MG PO TABS
10.0000 mg | ORAL_TABLET | Freq: Every day | ORAL | Status: DC
  Administered 2013-07-25 – 2013-07-29 (×5): 10 mg via ORAL
  Filled 2013-07-25 (×5): qty 1

## 2013-07-25 MED ORDER — IPRATROPIUM BROMIDE 0.02 % IN SOLN
0.5000 mg | Freq: Once | RESPIRATORY_TRACT | Status: AC
  Administered 2013-07-25: 0.5 mg via RESPIRATORY_TRACT
  Filled 2013-07-25: qty 2.5

## 2013-07-25 MED ORDER — BENZONATATE 100 MG PO CAPS
100.0000 mg | ORAL_CAPSULE | Freq: Once | ORAL | Status: AC
  Administered 2013-07-25: 100 mg via ORAL
  Filled 2013-07-25: qty 1

## 2013-07-25 MED ORDER — IOHEXOL 300 MG/ML  SOLN
25.0000 mL | INTRAMUSCULAR | Status: DC
  Administered 2013-07-25: 25 mL via ORAL

## 2013-07-25 MED ORDER — CITALOPRAM HYDROBROMIDE 40 MG PO TABS
40.0000 mg | ORAL_TABLET | Freq: Every day | ORAL | Status: DC
  Administered 2013-07-25 – 2013-07-29 (×5): 40 mg via ORAL
  Filled 2013-07-25 (×5): qty 1

## 2013-07-25 MED ORDER — OXYCODONE HCL 5 MG PO TABS
5.0000 mg | ORAL_TABLET | Freq: Once | ORAL | Status: AC
  Administered 2013-07-25: 5 mg via ORAL
  Filled 2013-07-25: qty 1

## 2013-07-25 MED ORDER — ONDANSETRON HCL 4 MG/2ML IJ SOLN
4.0000 mg | Freq: Once | INTRAMUSCULAR | Status: AC
  Administered 2013-07-25: 4 mg via INTRAVENOUS
  Filled 2013-07-25: qty 2

## 2013-07-25 MED ORDER — AMITRIPTYLINE HCL 50 MG PO TABS
50.0000 mg | ORAL_TABLET | Freq: Every day | ORAL | Status: DC
  Administered 2013-07-25 – 2013-07-28 (×4): 50 mg via ORAL
  Filled 2013-07-25 (×5): qty 1

## 2013-07-25 MED ORDER — IOHEXOL 300 MG/ML  SOLN
80.0000 mL | Freq: Once | INTRAMUSCULAR | Status: AC | PRN
  Administered 2013-07-25: 80 mL via INTRAVENOUS

## 2013-07-25 MED ORDER — SODIUM CHLORIDE 0.9 % IV SOLN
INTRAVENOUS | Status: DC
  Administered 2013-07-25 – 2013-07-27 (×3): via INTRAVENOUS

## 2013-07-25 NOTE — Progress Notes (Signed)
Eula Listenammy M Manka [id number] Admission Data: 07/25/2013 6:37 PM Attending Provider: Jonah BlueAlejandro Paya, DO  PCP:No PCP Per Patient Consults/ Treatment Team: Treatment Team:  Meryl DareMalcolm T Stark, MD  Karen Baldwin is a 43 y.o. female patient admitted from ED awake, alert  & orientated  X 3,  Full Code, VSS - Blood pressure 117/81, pulse 96, temperature 98.8 F (37.1 C), temperature source Oral, resp. rate 18, height 5\' 8"  (1.727 m), weight 95.255 kg (210 lb), SpO2 92.00%.,  no c/o shortness of breath, no c/o chest pain, no distress noted.  IV site WDL:  antecubital left, condition patent and no redness with a transparent dsg that's clean dry and intact.  Allergies:   Allergies  Allergen Reactions  . Bactrim [Sulfamethoxazole-Trimethoprim] Hives    "skin burning"  . Penicillins Other (See Comments)    unknown     Past Medical History  Diagnosis Date  . Chronic bronchitis     smoker  . Kidney stone   . Migraine   . Hepatitis C 03/2012    [facility]    . Pelvic fracture     non-surgical mgt.   . Vaginal delivery 1999  . Depression     History:  obtained from the patient.   Pt orientation to unit, room and routine. Information packet given to patient/family and safety video watched.  Admission INP armband ID verified with patient/family, and in place. SR up x 2, fall risk assessment complete with Patient and family verbalizing understanding of risks associated with falls. Pt verbalizes an understanding of how to use the call bell and to call for help before getting out of bed.  Skin, clean-dry- intact without evidence of bruising, or skin tears.   No evidence of skin break down noted on exam.     Will cont to monitor and assist as needed.  Cindra EvesCelano, Mikaia Janvier Czarnecki, RN 07/25/2013 6:37 PM

## 2013-07-25 NOTE — H&P (Signed)
Date: 07/25/2013               Patient Name:  Karen Baldwin MRN: [id number]  DOB: [birth year] Age / Sex: 43 y.o., female   PCP: No Pcp Per Patient              Medical Service: Internal Medicine Teaching Service              Attending Physician: Dr. Darlys Gales, MD    First Contact: Daivd Council, MS4 Pager: [phone]  Second Contact: Dr. Desma Maxim Pager: [phone]       After Hours (After 5p/  First Contact Pager: [phone]  weekends / holidays): Second Contact Pager: [phone]   Chief Complaint: Fever/chills, cough, nausea/vomiting  History of Present Illness: Patient is a 43yo F with a PMH of chronic bronchitis, nephrolithiasis, migraines, Hep C, and tobacco use who presented to the ED with complaints of fever/chills, nausea/vomiting, and cough.   She had complaints of cough and sinus pain/pressure that she states have been ongoing since approx 07/12/2013. She saw a provider at the [facility] (a support center for homeless individuals) at that time who told her it was likely viral and to return in 10 days if symptoms had not improved. She returned to the [facility] on 07/22/13 and was prescribed Azithromycin. She presented to the ED at [facility] the following day with complaints of fever, body aches, nausea, and worsening cough. A CXR at that time was negative. She was discharged with instructions to continue Azithromycin and return if symptoms worsened.   Today she presents with complaints of continued fever, chills, cough, RUQ abdominal pain, a 1-day history of post-tussive vomiting, a 1-day history of back pain (L > R), urinary retention, and dark urine. She was found in the ED to have elevated LFTs (AST = 268 / ALT = 154) and hematuria.   Meds: Current Facility-Administered Medications  Medication Dose Route Frequency Provider Last Rate Last Dose  . 0.9 %  sodium chloride infusion   Intravenous Continuous Lorretta Harp, MD      . iohexol (OMNIPAQUE) 300 MG/ML solution 25 mL   25 mL Oral Once PRN Medication Radiologist, MD       Current Outpatient Prescriptions  Medication Sig Dispense Refill  . albuterol (PROVENTIL HFA;VENTOLIN HFA) 108 (90 BASE) MCG/ACT inhaler Inhale 2 puffs into the lungs every 6 (six) hours as needed for wheezing or shortness of breath.      Marland Kitchen amitriptyline (ELAVIL) 50 MG tablet Take 50 mg by mouth at bedtime.      . benzonatate (TESSALON) 100 MG capsule Take 100 mg by mouth 3 (three) times daily as needed for cough (cough).      . chlorpheniramine-HYDROcodone (TUSSIONEX PENNKINETIC ER) 10-8 MG/5ML LQCR Take 5 mLs by mouth every 12 (twelve) hours as needed for cough.  115 mL  0  . citalopram (CELEXA) 40 MG tablet Take 40 mg by mouth daily.      Marland Kitchen dicyclomine (BENTYL) 20 MG tablet Take 1 tablet (20 mg total) by mouth 2 (two) times daily.  20 tablet  0  . hydrOXYzine (ATARAX/VISTARIL) 50 MG tablet Take 50 mg by mouth 2 (two) times daily.      Marland Kitchen loratadine (CLARITIN) 10 MG tablet Take 10 mg by mouth daily.        Allergies: Allergies as of 07/25/2013 - Review Complete 07/25/2013  Allergen Reaction Noted  . Bactrim [sulfamethoxazole-trimethoprim] Hives 07/13/2013  . Penicillins Other (See Comments) 07/13/2013  Past Medical History  Diagnosis Date  . Chronic bronchitis     smoker  . Kidney stone   . Migraine   . Hepatitis C 03/2012    diagnosed [facility] done [facility].    . Pelvic fracture     non-surgical mgt.   . Vaginal delivery 1999  . Depression    Past Surgical History  Procedure Laterality Date  . [place]olecystectomy  2009    in [place]  . Partial hysterectomy  2001    for menorrhagia associated with uterine fibroids.    No family history on file. History   Social History  . Marital Status: Single    Spouse Name: N/A    Number of Children: 2  . Years of Education: N/A   Occupational History  . Not on file.   Social History Main Topics  . Smoking status: Current Every Day Smoker -- 0.50 packs/day    Types:  Cigarettes  . Smokeless tobacco: Not on file  . Alcohol Use: No  . 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 Sexual Activity: Not Currently   Other Topics Concern  . Not on file   Social History Narrative   Patient was recently discharged from prison on December 19th, 2014. Currently homeless and staying at the [facility].     Review of Systems: Constitutional: positive for chills and fevers, negative for night sweats and weight loss Ears, nose, mouth, throat, and face: positive for nasal congestion and sore throat Respiratory: positive for chronic bronchitis, cough and sputum, negative for hemoptysis Cardiovascular: positive for pleuritic chest pain, negative for irregular heart beat, lower extremity edema, palpitations and syncope Gastrointestinal: positive for abdominal pain, constipation, diarrhea and vomiting, negative for dysphagia and melena Genitourinary:positive for hematuria and hesitancy, negative for dysuria Musculoskeletal:positive for myalgias, negative for neck pain and stiff joints Behavioral/Psych: positive for tobacco use, negative for illegal drug usage  Physical Exam: Blood pressure 97/64, pulse 76, temperature 100.1 F (37.8 C), temperature source Rectal, resp. rate 36, height 5\' 8"  (1.727 m), weight 95.255 kg (210 lb), SpO2 96.00%. BP 97/64  Pulse 76  Temp(Src) 100.1 F (37.8 C) (Rectal)  Resp 36  Ht 5\' 8"  (1.727 m)  Wt 95.255 kg (210 lb)  BMI 31.94 kg/m2  SpO2 96%  General Appearance:    A&Ox3, curled up in bed lying in the dark when we first went to see her, in moderate distress  Head:    Normocephalic, without obvious abnormality, atraumatic  Eyes:    PERRL, conjunctiva/corneas clear, EOM's intact  Ears:    TM with a moderate amount of opacity bilaterally, normal external ear canals bilaterally  Nose:   Maxillary and frontal sinus tenderness bilaterally  Throat:   Lips, mucosa, and tongue  normal; with mild erythema in the OP without exudate  Neck:   Supple, symmetrical, trachea midline, no adenopathy  Back:     Positive for CVA tenderness on the L  Lungs:     Course breath sounds bilaterally, no focal crackles, increased work of breathing at rest without accessory muscle usage  Chest Wall:    Mild tenderness to palpation   Heart:    Regular rate and rhythm, S1 and S2 normal, no murmur, rub   or gallop  Abdomen:     Soft, RUQ with pain to palpation, bowel sounds active all four quadrants, no masses  Extremities:   Extremities normal, atraumatic, no cyanosis or edema  Pulses:   2+ and symmetric all extremities  Skin:   Skin color, texture, turgor normal, no rashes or lesions  Lymph nodes:   Cervical, supraclavicular, and axillary nodes normal  Neurologic:   CNII-XII intact    Lab results: AST: 268 ALT: 154 WBC: 5.3 Flu PCR: Negative UA: +for Hgb, 7-10 RBC, negative for leukocytes and nitrites  Imaging results:  Ct Abdomen Pelvis W Contrast  07/25/2013   CLINICAL DATA:  Persistent right-sided abdominal pain.  EXAM: CT ABDOMEN AND PELVIS WITH CONTRAST  TECHNIQUE: Multidetector CT imaging of the abdomen and pelvis was performed using the standard protocol following bolus administration of intravenous contrast.  CONTRAST:  80mL OMNIPAQUE IOHEXOL 300 MG/ML  SOLN  COMPARISON:  CT abdomen and pelvis without contrast 09/03/2008. CT abdomen and pelvis with contrast 11/13/2007.  FINDINGS: Patchy ground-glass attenuation is present at the lung bases bilaterally, right greater than left. The heart size is normal. No significant pleural or pericardial effusion is present.  Intra and extra hepatic biliary dilation has increased. There is a low-density lesion in the right lobe of the liver on image 23 of series 2 which has increased in size, now measuring 17 mm. This represents either a cyst or likely a biloma. The extra hepatic biliary duct measures up to 15 mm. No obstructing lesion is  evident.  The spleen is unremarkable. The stomach and duodenum are within normal limits. The pancreas is otherwise unremarkable. No mass lesion is evident. There is no significant dilation of the pancreatic duct. An 11 mm cyst on the right at 12 mm cyst in the left kidney are stable. The right ureter is mildly dilated to the point where crosses the right iliac artery, likely physiologic.  The rectosigmoid colon is within normal limits. The remainder of the colon is unremarkable. The appendix is visualized and normal. The small bowel is within normal limits. The patient is status post hysterectomy. A low-density lesion associated with the left ovary measures 2.7 cm, likely a benign cyst. The right ovary is unremarkable. No significant adenopathy or free fluid is present.  The bone windows demonstrate no focal lytic or blastic lesions.  IMPRESSION: 1. New by about lateral lower lobe airspace disease. This appears to be more prominent than typical atelectasis and is concerning for early infection or potentially even aspiration. 2. Slight increase in intra and extra hepatic biliary dilation. No obstructing lesion is evident. MRCP could be used for further evaluation if clinically indicated. 3. Status post hysterectomy. A cystic lesion in the left ovary appears benign.   Electronically Signed   By: Gennette Pachris  Mattern M.D.   On: 07/25/2013 10:25    Other results: EKG: Reviewed from 1/13 admission, demonstrated sinus tachycardia, low voltage in precordial leads, and borderline T wave changes. Repeat EKG pending.   Assessment & Plan by Problem: Principal Problem:   CAP (community acquired pneumonia) Active Problems:   Hepatitis C   Chronic bronchitis   Renal disorder   Abdominal pain   #Community Acquired Pneumonia Patient on Azithromycin PO now with worsening cough, fever/chills, nausea/vomiting and CT demonstrating bilateral airspace disease. Flu PCR negative. - Blood cultures x2  - Check lactic acid - d/c  Azithromycin, start Levaquin IV - Albuterol inhaler PRN - Tessalon for cough - CBC/ CMP in AM  #Transaminitis/Abdominal pain Elevated AST/ALT, wnl on 07/13/13. Patient denies EtOH use. May be 2/2 Azithromycin and Tylenol use in the setting of Hepatitis C.  S/p cholecystectomy 6-7 years ago. Patient reports a history for ~6 months of symptoms of frequent stools and intermittent constipation with mucous covered stool, on Bentyl. - d/c Azithromycin - Ibuprofen for fever  - Holding home Bentyl - NS @ 125 cc/hr - CMP in AM - Colace PRN - Zofran PRN nausea - Clear liquid diet and NPO at midnight per GI - MRCP tomorrow per GI recs  #Hematuria and urinary retention Patient with history of kidney stones and renal cysts. Cysts stable on CT. Denies dysuria, UA negative for UTI but +RBC and pt reports obstructive urinary symptoms. Has a history of bladder spasm diagnosed via urodynamics per patient report. +CVA tenderness on exam, L>R. Cr wnl. Dilation of R ureter on CT, may represent recently passed stone.  - NS @ 125/hr  - CMP in AM - Repeat UA tomorrow  #FEN/GI - Clear liquid diet - NPO at midnight for MRCP  #PPx - Heparin  #Dispo - Admit to floor   This is a Psychologist, occupational Note.  The care of the patient was discussed with Dr. Clyde Lundborg and the assessment and plan was formulated with their assistance.  Please see their note for official documentation of the patient encounter.   Signed: Daivd Council, Med Student 07/25/2013, 3:26 PM

## 2013-07-25 NOTE — ED Notes (Signed)
Pt ambulatory to bathroom

## 2013-07-25 NOTE — ED Notes (Signed)
Ct notified pt done drinking contrast

## 2013-07-25 NOTE — ED Provider Notes (Signed)
CSN: [id number]     Arrival date & time 07/25/13  0617 History   First MD Initiated Contact with Patient 07/25/13 [phone]     Chief Complaint  Patient presents with  . Vomiting   (Consider location/radiation/quality/duration/timing/severity/associated sxs/prior Treatment) HPI  43 year old female with history of chronic bronchitis presents with multiple complaints. Patient was seen for flu symptoms 2 days ago after having worsening cough for several days. She is currently taking azithromycin for sinus infection and today is her last day of antibiotic. She had a negative chest x-ray 2 days ago. She was discharged with a DuoNeb, cough medication.  Patient continued to endorse persistent cough, occasional wheezing, generalized body aches, abdominal pain from coughing, has had 3-4 bouts of posttussive emesis since yesterday. States her urine is dark and she is peeing less than usual. Complaining of burning while urinating. Patient is currently staying at the shelter but unable to sleep due to persistent cough. States the antibiotic has not helped. Patient reports she feels worn down and dehydrated. Endorse fever as high as 102. Reports having chills as well. Denies hemoptysis, neck stiffness, vaginal discharge or rash. Patient has hysterectomy.  Past Medical History  Diagnosis Date  . Chronic bronchitis   . Renal disorder   . Kidney stone   . Migraine    Past Surgical History  Procedure Laterality Date  . Cholecystectomy    . Abdominal hysterectomy     No family history on file. History  Substance Use Topics  . Smoking status: Current Every Day Smoker -- 0.50 packs/day    Types: Cigarettes  . Smokeless tobacco: Not on file  . Alcohol Use: No   OB History   Grav Para Term Preterm Abortions TAB SAB Ect Mult Living                 Review of Systems  All other systems reviewed and are negative.    Allergies  Bactrim and Penicillins  Home Medications   Current Outpatient Rx  Name   Route  Sig  Dispense  Refill  . acetaminophen (TYLENOL) 160 MG/5ML suspension   Oral   Take 320 mg by mouth every 6 (six) hours as needed (pain).         Marland Kitchen albuterol (PROVENTIL HFA;VENTOLIN HFA) 108 (90 BASE) MCG/ACT inhaler   Inhalation   Inhale 2 puffs into the lungs every 6 (six) hours as needed for wheezing or shortness of breath.         Marland Kitchen amitriptyline (ELAVIL) 50 MG tablet   Oral   Take 50 mg by mouth at bedtime.         . benzonatate (TESSALON) 100 MG capsule   Oral   Take 100 mg by mouth 3 (three) times daily as needed for cough (cough).         . chlorpheniramine-HYDROcodone (TUSSIONEX PENNKINETIC ER) 10-8 MG/5ML LQCR   Oral   Take 5 mLs by mouth every 12 (twelve) hours as needed for cough.   115 mL   0   . citalopram (CELEXA) 40 MG tablet   Oral   Take 40 mg by mouth daily.         Marland Kitchen dicyclomine (BENTYL) 20 MG tablet   Oral   Take 1 tablet (20 mg total) by mouth 2 (two) times daily.   20 tablet   0   . hydrOXYzine (ATARAX/VISTARIL) 50 MG tablet   Oral   Take 50 mg by mouth 2 (two) times daily.         Marland Kitchen  ibuprofen (ADVIL,MOTRIN) 800 MG tablet   Oral   Take 800 mg by mouth 3 (three) times daily.         Marland Kitchen loratadine (CLARITIN) 10 MG tablet   Oral   Take 10 mg by mouth daily.          There were no vitals taken for this visit. Physical Exam  Nursing note and vitals reviewed. Constitutional: She appears well-developed and well-nourished. She appears distressed (patient appears uncomfortable, coughing persistently).  HENT:  Head: Atraumatic.  Ears: Bilateral TM is mildly erythematous without effusion  Nose: Rhinorrhea noted  Throat: Uvula is midline, oropharyngeal erythema noted, no tonsillar enlargement exudate  Eyes: Conjunctivae are normal.  Neck: Normal range of motion. Neck supple.  No nuchal rigidity  Cardiovascular:  Tachycardia without murmurs, rubs, or gallops noted  Pulmonary/Chest:  Mild expiratory wheezes, and rhonchi  heard. No rales  Abdominal: Soft. There is tenderness (Suprapubic tenderness on palpation without guarding or rebound tenderness).  Lymphadenopathy:    She has no cervical adenopathy.  Neurological: She is alert.  Skin: No rash noted.  Psychiatric: She has a normal mood and affect.    ED Course  Procedures (including critical care time)  Patient here with flulike symptoms. She does have mild rhonchi and wheezes on exam. Will give breathing treatments, she is tachycardic but without significant risk factors for PE. We'll give IV fluid. We'll check labs and UA as well.  8:15 AM Pt has transaminitis with AST 268, ALT 154, and Alk Phos 164.  Has prior cholecystectomy.  Will obtain abd/pelvis CT for further evaluation.  Denies hx of diabetes or alcohol abuse.    12:39 PM CT shows new bilateral lower lobe airspace disease concerning for pna, possible aspiration.  Also light increase intra and extra hepatic biliary dilation without obvious obstruction.  Recommend MRCP for further evaluation.  Since pt has new  transaminitis with elevated alk phos, will consult GI and will call med for admission.    1:28 PM I have consulted with GI specialist, Dr. Glyn Ade, who agrees to see pt in consultation.  I have also consulted internal medicine resident, who will see pt in ER and will admitted for further management of her community acquired pneumonia, which fail outpt treatment with zpak.  Pt also needs admission for sxs control due to persistent abd pain with n/v.  Pt agrees with plan.    Labs Review Labs Reviewed  CBC WITH DIFFERENTIAL - Abnormal; Notable for the following:    RBC 3.81 (*)    Hemoglobin 11.8 (*)    HCT 35.1 (*)    All other components within normal limits  COMPREHENSIVE METABOLIC PANEL - Abnormal; Notable for the following:    Sodium 133 (*)    Glucose, Bld 103 (*)    AST 268 (*)    ALT 154 (*)    Alkaline Phosphatase 164 (*)    All other components within normal limits   URINALYSIS, ROUTINE W REFLEX MICROSCOPIC - Abnormal; Notable for the following:    APPearance CLOUDY (*)    Hgb urine dipstick SMALL (*)    All other components within normal limits  URINE MICROSCOPIC-ADD ON - Abnormal; Notable for the following:    Squamous Epithelial / LPF FEW (*)    Bacteria, UA FEW (*)    All other components within normal limits  LIPASE, BLOOD  INFLUENZA PANEL BY PCR (TYPE A & B, H1N1)   Imaging Review Dg Chest 2 View  07/23/2013  CLINICAL DATA:  Cough, congestion  EXAM: CHEST  2 VIEW  COMPARISON:  12/24/2007  FINDINGS: Cardiomediastinal silhouette is stable. No acute infiltrate or pleural effusion. No pulmonary edema. Bony thorax is unremarkable.  IMPRESSION: No active cardiopulmonary disease.   Electronically Signed   By: Lahoma Crocker M.D.   On: 07/23/2013 13:33    EKG Interpretation   None       MDM   1. CAP (community acquired pneumonia)   2. Transaminitis   3. Abdominal pain   4. Nausea & vomiting    BP 100/67  Pulse 105  Temp(Src) 100.1 F (37.8 C) (Rectal)  Resp 30  Ht _0  (1.727 m)  Wt 210 lb (95.255 kg)  BMI 31.94 kg/m2  SpO2 95%  I have reviewed nursing notes and vital signs. I personally reviewed the imaging tests through PACS system  I reviewed available ER/hospitalization records thought the EMR     Domenic Moras, PA-C 07/25/13 1330

## 2013-07-25 NOTE — ED Notes (Signed)
MD at bedside. 

## 2013-07-25 NOTE — Consult Note (Signed)
[facility] Consult: 3:10 PM 07/25/2013  LOS: 0 days    Referring Provider: Domenic Moras PA-C in ED Primary Care Physician:  [place] Primary Gastroenterologist:  unassigned   Reason for Consultation:  Abdominal pain and elevated LFTs.    HPI: Karen Baldwin is a 43 y.o. female.  6751 uncomplicated lap chole.  Hx of incarceration Sept 2013 to Jun 28, 2013.  Living in [facility] since then. 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  Was HIV negative. Received Hep A and B vaccination series through prison.  Pt not aware of any issues with transaminases.  For at least 6 months has had issues with nausea, pain in RUQ. Eratic bowel habits of up to 12 BMs producing rabbit pellet type stools, some mucous but no blood.  1 or 2 days per week passes only mucous, again without blood. Treated with Levsin in prison, Bentyl since release.  Came to [facility] ED 07/13/12.  Urine, CBC,  FOBT, CMET unrevealing, no radiologic studies.  As she was non-toxic she was referred to oupt GI.   Has had chronic, but recently worsening cough with yellow green sputum.  On Monday she was started on Zithromax [facility].  Came to ED on 07/23/12.  2 view chest xray negative. Treated with IVF and duoneb and released with Tussionex Rx and contnued on Zithromax. Cough and associated chest pain has gotten worse, she is having fevers, feels lousy, started vomiting bilious, non-bloody material overnight.  Returned to ED today.  Has bilateral pneumonia by CT scan.   Labs today with AST/ALT 268/154, alk phos 164, normal T bili.  CT abdomen shows dilated bile ducts, 15 mm CBD, density in right lobe of liver: cyst vs biloma.    Past Medical History  Diagnosis Date  . Chronic bronchitis     smoker  .  Kidney stone   . Migraine   . Hepatitis C 03/2012    diagnosed by labs done at prison.    . Pelvic fracture     non-surgical mgt.   . Vaginal delivery 1999  . Depression     Past Surgical History  Procedure Laterality Date  . Cholecystectomy  2009    in [place]  . Partial hysterectomy  2001    for menorrhagia associated with uterine fibroids.     Prior to Admission medications   Medication Sig Start Date End Date Taking? Authorizing Provider  acetaminophen (TYLENOL) 325 MG tablet Take 650 mg by mouth every 6 (six) hours as needed for moderate pain or fever.   Yes Historical Provider, MD  albuterol (PROVENTIL HFA;VENTOLIN HFA) 108 (90 BASE) MCG/ACT inhaler Inhale 2 puffs into the lungs every 6 (six) hours as needed for wheezing or shortness of breath.   Yes Historical Provider, MD  amitriptyline (ELAVIL) 50 MG tablet Take 50 mg by mouth at bedtime.   Yes Historical Provider, MD  benzonatate (TESSALON) 100 MG capsule Take 100 mg by mouth 3 (three) times daily as needed for cough (  cough).   Yes Historical Provider, MD  chlorpheniramine-HYDROcodone (TUSSIONEX PENNKINETIC ER) 10-8 MG/5ML LQCR Take 5 mLs by mouth every 12 (twelve) hours as needed for cough. 07/23/13  Yes Illene Labrador, PA-C  citalopram (CELEXA) 40 MG tablet Take 40 mg by mouth daily.   Yes Historical Provider, MD  dicyclomine (BENTYL) 20 MG tablet Take 1 tablet (20 mg total) by mouth 2 (two) times daily. 2013-07-26  Yes Elwyn Lade, PA-C  hydrOXYzine (ATARAX/VISTARIL) 50 MG tablet Take 50 mg by mouth 2 (two) times daily.   Yes Historical Provider, MD  loratadine (CLARITIN) 10 MG tablet Take 10 mg by mouth daily.   Yes Historical Provider, MD    Scheduled Meds: . ondansetron (ZOFRAN) IV  4 mg Intravenous Once  . oxyCODONE  5 mg Oral Once   Infusions:   PRN Meds: iohexol   Allergies as of 07/25/2013 - Review Complete 07/25/2013  Allergen Reaction Noted  . Bactrim [sulfamethoxazole-trimethoprim] Hives 2013/07/26   . Penicillins Other (See Comments) 07-26-2013    Family History Dad died with brain aneurysm at age 85.  He was an alcoholic and had Hepatitis C  History   Social History  . Marital Status: Single    Spouse Name: N/A    Number of Children: N/A  . Years of Education: N/A   Occupational History  . Unemployed.    Social History Main Topics  . Smoking status: Current Every Day Smoker -- 0.50 packs/day    Types: Cigarettes  . Smokeless tobacco: Not on file  . Alcohol Use: None at all  . Drug Use: None.  Previous snorting of cocaine, denies since 2011.  Injected cocaine on 2 occasions.   . Sexual Activity: Not Currently   Other Topics Concern  . Her son and her mom live in [place].  She does not want to return to [facility] as she wants to make a fresh start and avoid falling into wrong crowd.    Social History Narrative  . Living at women's shelter since prison release 06/28/2013.  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     REVIEW OF SYSTEMS: Constitutional:  Has gained 5 or 10 # in last 4 weeks, eating a lot of cake provided by shelter volunteers ENT:  No nose bleeds Pulm:  Per HPI CV:  No palpitations, no LE edema.  GU:  No hematuria, no frequency GI:  Per HPI.  No dysphagia Heme:  Per HPI   Transfusions:  None  Neuro:  No headaches, no peripheral tingling or numbness Derm:  No itching, no rash or sores.  Endocrine:  No sweats or chills.  No polyuria or dysuria Immunization:  No flu shot.  Hep A and B vaccination in 2013-2014.  Travel:  None beyond local counties in last few months.    PHYSICAL EXAM: Vital signs in last 24 hours: Filed Vitals:   07/25/13 1337  BP: 97/64  Pulse: 76  Temp:   Resp: 36   Wt Readings from Last 3 Encounters:  07/25/13 95.255 kg (210 lb)   General: overweight WF who is comfortable but somewhat ill and miserable looking. Looks tired.  Not toxic.   Head:  No asymmetry or swelling  Eyes:  No icterus or pallor Ears:  Not HOH    Nose:  Congested but no drainage.  Mouth:  Clear but dry oral MM Neck:  No mass or JVD.  No bruits Lungs:  Clear bil.  Reduced basilar BS.  + cough, mostly dry.  Heart: RRR.  No MRG Abdomen:  Soft, RUQ tender without mass/guard/rebound.  No bruits.  No HSM.   Rectal: deferred,   Musc/Skeltl: no joint swelling, contracture or deformity Extremities:  No CCE.  Feet warm  Neurologic:  Oriented x 3.  Moves all 4s.  Limb strength equal and full.  Skin:  No telangectasia or rash.  No palmar erythema Tattoos:  On left leg and back, professionally done.  Nodes:  No cervical adenopathy.   Psych:  Pleasant, cooperative, affect "under the weather".   Intake/Output from previous day:   Intake/Output this shift:    LAB RESULTS:  Recent Labs  07/25/13 0650  WBC 5.3  HGB 11.8*  HCT 35.1*  PLT 201  MCV    92  BMET Lab Results  Component Value Date   NA 133* 07/25/2013   NA 142 07/13/2013   NA 142 09/15/2008   K 4.0 07/25/2013   K 4.0 07/13/2013   K 3.9 09/15/2008   CL 97 07/25/2013   CL 106 07/13/2013   CL 107 09/15/2008   CO2 23 07/25/2013   CO2 24 07/13/2013   CO2 25 09/15/2008   GLUCOSE 103* 07/25/2013   GLUCOSE 79 07/13/2013   GLUCOSE 92 09/15/2008   BUN 8 07/25/2013   BUN 11 07/13/2013   BUN 11 09/15/2008   CREATININE 0.68 07/25/2013   CREATININE 0.77 07/13/2013   CREATININE 0.8 09/15/2008   CALCIUM 8.9 07/25/2013   CALCIUM 9.1 07/13/2013   CALCIUM 8.9 09/15/2008   LFT  Recent Labs  07/25/13 0650  PROT 7.6  ALBUMIN 3.8  AST 268*  ALT 154*  ALKPHOS 164*  BILITOT 0.7   PT/INR No results found for this basename: INR,  PROTIME   Hepatitis Panel No results found for this basename: HEPBSAG, HCVAB, HEPAIGM, HEPBIGM,  in the last 72 hours C-Diff No components found with this basename: cdiff   Lipase     Component Value Date/Time   LIPASE 20 07/25/2013 0650    Drugs of Abuse  No results found for this basename: labopia,  cocainscrnur,  labbenz,  amphetmu,  thcu,  labbarb     RADIOLOGY  STUDIES: Ct Abdomen Pelvis W Contrast  07/25/2013   FINDINGS: Patchy ground-glass attenuation is present at the lung bases bilaterally, right greater than left. The heart size is normal. No significant pleural or pericardial effusion is present.  Intra and extra hepatic biliary dilation has increased. There is a low-density lesion in the right lobe of the liver on image 23 of series 2 which has increased in size, now measuring 17 mm. This represents either a cyst or likely a biloma. The extra hepatic biliary duct measures up to 15 mm. No obstructing lesion is evident.  The spleen is unremarkable. The stomach and duodenum are within normal limits. The pancreas is otherwise unremarkable. No mass lesion is evident. There is no significant dilation of the pancreatic duct. An 11 mm cyst on the right at 12 mm cyst in the left kidney are stable. The right ureter is mildly dilated to the point where crosses the right iliac artery, likely physiologic.  The rectosigmoid colon is within normal limits. The remainder of the colon is unremarkable. The appendix is visualized and normal. The small bowel is within normal limits. The patient is status post hysterectomy. A low-density lesion associated with the left ovary measures 2.7 cm, likely a benign cyst. The right ovary is unremarkable. No significant adenopathy or free fluid is present.  The bone  windows demonstrate no focal lytic or blastic lesions.  IMPRESSION: 1. New by about lateral lower lobe airspace disease. This appears to be more prominent than typical atelectasis and is concerning for early infection or potentially even aspiration. 2. Slight increase in intra and extra hepatic biliary dilation. No obstructing lesion is evident. MRCP could be used for further evaluation if clinically indicated. 3. Status post hysterectomy. A cystic lesion in the left ovary appears benign.   Electronically Signed   By: Lawrence Santiago M.D.   On: 07/25/2013 10:25    ENDOSCOPIC  STUDIES: none  IMPRESSION:   *  Elevated LFTs in pt with Hepatitis C.  Intra and extrahepatic biliary duct dilation, right lobe lesion on CT. CBD is 15 mm. GB removed 2009.  Has been on Zithromax since 05/22/14 and that may be driving abnormal LFTs but may have choledocholithiasis or lesion causing ductal dilatation.    *  Sinus infection, started on Zithromax 05/21/14 Now with bil pneumonia by CT scan. Zithromax stopped and pt started on Levaquin.   *  Hx nephrolithiasis.  Small amount of RBCs on UA. Renal cysts, no stones and right ureteral dilatation on CTscan.   *  Hyponatremia, new today.    PLAN:     *  MRCP.  May be best to wait and pursue this tomorrow to allow resp status to improve.  This will make test easier as she will be required to hold her breath.  Will order this for tomorrow.  Ok to feed as tolerated until midnight.   Azucena Freed  07/25/2013, 3:10 PM Pager: [phone]      Attending physician's note   I have taken a history, examined the patient and reviewed the chart. I agree with the Advanced Practitioner's note, impression and recommendations. Elevated LFTs could be secondary to HCV and/or a medication induced injury. Extrahepatic duct dilation is not unusual following cholecystectomy but a CBD stone or other biliary process needs to be excluded. MRCP to further evaluate. Hopefully she can hold still enough to complete with adequate images. Trend LFTs.   Ladene Artist, MD [facility]

## 2013-07-25 NOTE — H&P (Signed)
Hospital Admission Note Date: 07/25/2013  Patient name: Karen Baldwin Medical record number: [id number] Date of birth: March 09, 1971 Age: 43 y.o. Gender: female PCP: No PCP Per Patient  Medical Service: IMTS  Attending physician: Dr. Kem Kays  Internal Medicine Teaching Service Contact Information  Weekday Hours (7AM-5PM):   First Contact: Daivd Council, MS4 Pager: [phone]   2nd Contact: Dr.  Shirlee Latch           Pager:   [phone]  ** If no return call within 15 minutes (after trying both pagers listed above), please call after hours pagers.   After 5 pm or weekends: 1st Contact: Pager: [phone] 2nd Contact: Pager: [phone]  Chief Complaint: Fever/chills, cough, nausea/vomiting/abdominal pain  History of Present Illness:   Patient is a 43yo F with a PMH of chronic bronchitis, nephrolithiasis, migraines, Hep C, and tobacco use,  who presented with fever, chills, nausea, vomiting, abdominal pain.   Patient reports that she was recently incarcerated and released from jail on 06/28/14. She had been having cough and sinus pain since 07/12/2013. She saw a provider at the [facility] (a support center for homeless individuals). At that time, she was told that she might have viral infection and return in 10 days if symptoms not improved. Due to continuation of her symptoms, she returned to the [facility] on 07/22/13 and was treated with Azithromycin. She presented to the ED at [facility] the following day with complaints of fever, body aches, nausea, and worsening cough. A CXR at that time was negative. She was discharged with instructions to continue Azithromycin and return if symptoms worsened. She reports that she has been taking her medications, but continue to have fever, chills and cough. She coughs up greenish sputum. She also has SOB, chest pain which is sharp, located in front chest, 5/10 in severity and non-radiating. The chest pain is aggravated by coughing.   Patient reports that she  was diagnosed with HCV in 03/2012. She was vaccinated for HAV and HBV. She has chronic abdominal pain in the past 6 months. She has intermittently noticed mucus in her stool, but does not have diarrhea. She has one day history of post-tussive vomiting. She was found in the ED to have elevated LFTs (AST = 268 / ALT = 154) and hematuria on UA. GI was consulted by Ed.   ROS: has dark urine, no dysuria, urgency or frequency, back pain, No leg swelling and rashes.   Meds: Current Outpatient Rx  Name  Route  Sig  Dispense  Refill  . acetaminophen (TYLENOL) 325 MG tablet   Oral   Take 650 mg by mouth every 6 (six) hours as needed for moderate pain or fever.         Marland Kitchen albuterol (PROVENTIL HFA;VENTOLIN HFA) 108 (90 BASE) MCG/ACT inhaler   Inhalation   Inhale 2 puffs into the lungs every 6 (six) hours as needed for wheezing or shortness of breath.         Marland Kitchen amitriptyline (ELAVIL) 50 MG tablet   Oral   Take 50 mg by mouth at bedtime.         . benzonatate (TESSALON) 100 MG capsule   Oral   Take 100 mg by mouth 3 (three) times daily as needed for cough (cough).         . chlorpheniramine-HYDROcodone (TUSSIONEX PENNKINETIC ER) 10-8 MG/5ML LQCR   Oral   Take 5 mLs by mouth every 12 (twelve) hours as needed for cough.   115 mL  0   . citalopram (CELEXA) 40 MG tablet   Oral   Take 40 mg by mouth daily.         Marland Kitchen dicyclomine (BENTYL) 20 MG tablet   Oral   Take 1 tablet (20 mg total) by mouth 2 (two) times daily.   20 tablet   0   . hydrOXYzine (ATARAX/VISTARIL) 50 MG tablet   Oral   Take 50 mg by mouth 2 (two) times daily.         Marland Kitchen loratadine (CLARITIN) 10 MG tablet   Oral   Take 10 mg by mouth daily.           Allergies: Allergies as of 07/25/2013 - Review Complete 07/25/2013  Allergen Reaction Noted  . Bactrim [sulfamethoxazole-trimethoprim] Hives 07/13/2013  . Penicillins Other (See Comments) 07/13/2013   Past Medical History  Diagnosis Date  . Chronic  bronchitis   . Renal disorder   . Kidney stone   . Migraine   . Hepatitis C    Past Surgical History  Procedure Laterality Date  . Cholecystectomy    . Abdominal hysterectomy     No family history on file. History   Social History  . Marital Status: Single    Spouse Name: N/A    Number of Children: N/A  . Years of Education: N/A   Occupational History  . Not on file.   Social History Main Topics  . Smoking status: Current Every Day Smoker -- 0.50 packs/day    Types: Cigarettes  . Smokeless tobacco: Not on file  . Alcohol Use: No  . Drug Use: Not on file  . Sexual Activity: Not Currently   Other Topics Concern  . Not on file   Social History Narrative  . No narrative on file    Review of Systems: Full 14-point review of systems otherwise negative except as noted above in HPI.  Physical Exam:   Filed Vitals:   07/25/13 0805 07/25/13 1030 07/25/13 1200 07/25/13 1337  BP:  111/68 100/67 97/64  Pulse:  108 105 76  Temp: 100.1 F (37.8 C)     TempSrc: Rectal     Resp:  30  36  Height:      Weight:      SpO2:  99% 95% 96%    General: Not in acute distress.  She appears distressed, looks uncomfortable.  HEENT: PERRL, EOMI, no scleral icterus, No JVD or bruit.  Dry mucous and membrane. Oropharyngeal erythema noted, no tonsillar enlargement or exudation. Cardiac: S1/S2, RRR, No murmurs, gallops or rubs Pulm: has crackles over the right lower field posteriorly, No rubs. Abd: Soft, nondistended, tender over the RUQ,  no rebound pain, no organomegaly, BS present Ext: No edema. 2+DP/PT pulse bilaterally Musculoskeletal: No joint deformities, erythema, or stiffness, ROM full Skin: No rashes.  Neuro: Alert and oriented X3, cranial nerves II-XII grossly intact, muscle strength 5/5 in all extremeties, sensation to light touch intact. Brachial reflex 2+ bilaterally. Knee reflex 1+ bilaterally. Negative Babinski's sign. Normal finger to nose test. Psych: Patient is not  psychotic, no suicidal or hemocidal ideation.  Lab results: Basic Metabolic Panel:  Recent Labs  40/98/11 0650  NA 133*  K 4.0  CL 97  CO2 23  GLUCOSE 103*  BUN 8  CREATININE 0.68  CALCIUM 8.9   Liver Function Tests:  Recent Labs  07/25/13 0650  AST 268*  ALT 154*  ALKPHOS 164*  BILITOT 0.7  PROT 7.6  ALBUMIN 3.8  Recent Labs  07/25/13 0650  LIPASE 20   No results found for this basename: AMMONIA,  in the last 72 hours CBC:  Recent Labs  07/25/13 0650  WBC 5.3  NEUTROABS 4.0  HGB 11.8*  HCT 35.1*  MCV 92.1  PLT 201   Cardiac Enzymes: No results found for this basename: CKTOTAL, CKMB, CKMBINDEX, TROPONINI,  in the last 72 hours BNP: No results found for this basename: PROBNP,  in the last 72 hours D-Dimer: No results found for this basename: DDIMER,  in the last 72 hours CBG: No results found for this basename: GLUCAP,  in the last 72 hours Hemoglobin A1C: No results found for this basename: HGBA1C,  in the last 72 hours Fasting Lipid Panel: No results found for this basename: CHOL, HDL, LDLCALC, TRIG, CHOLHDL, LDLDIRECT,  in the last 72 hours Thyroid Function Tests: No results found for this basename: TSH, T4TOTAL, FREET4, T3FREE, THYROIDAB,  in the last 72 hours Anemia Panel: No results found for this basename: VITAMINB12, FOLATE, FERRITIN, TIBC, IRON, RETICCTPCT,  in the last 72 hours Coagulation: No results found for this basename: LABPROT, INR,  in the last 72 hours Urine Drug Screen: Drugs of Abuse  No results found for this basename: labopia,  cocainscrnur,  labbenz,  amphetmu,  thcu,  labbarb    Alcohol Level: No results found for this basename: ETH,  in the last 72 hours Urinalysis:  Recent Labs  07/25/13 0811  COLORURINE YELLOW  LABSPEC 1.015  PHURINE 7.5  GLUCOSEU NEGATIVE  HGBUR SMALL*  BILIRUBINUR NEGATIVE  KETONESUR NEGATIVE  PROTEINUR NEGATIVE  UROBILINOGEN 0.2  NITRITE NEGATIVE  LEUKOCYTESUR NEGATIVE   Misc.  Labs:  Imaging results:  Ct Abdomen Pelvis W Contrast  07/25/2013   CLINICAL DATA:  Persistent right-sided abdominal pain.  EXAM: CT ABDOMEN AND PELVIS WITH CONTRAST  TECHNIQUE: Multidetector CT imaging of the abdomen and pelvis was performed using the standard protocol following bolus administration of intravenous contrast.  CONTRAST:  80mL OMNIPAQUE IOHEXOL 300 MG/ML  SOLN  COMPARISON:  CT abdomen and pelvis without contrast 09/03/2008. CT abdomen and pelvis with contrast 11/13/2007.  FINDINGS: Patchy ground-glass attenuation is present at the lung bases bilaterally, right greater than left. The heart size is normal. No significant pleural or pericardial effusion is present.  Intra and extra hepatic biliary dilation has increased. There is a low-density lesion in the right lobe of the liver on image 23 of series 2 which has increased in size, now measuring 17 mm. This represents either a cyst or likely a biloma. The extra hepatic biliary duct measures up to 15 mm. No obstructing lesion is evident.  The spleen is unremarkable. The stomach and duodenum are within normal limits. The pancreas is otherwise unremarkable. No mass lesion is evident. There is no significant dilation of the pancreatic duct. An 11 mm cyst on the right at 12 mm cyst in the left kidney are stable. The right ureter is mildly dilated to the point where crosses the right iliac artery, likely physiologic.  The rectosigmoid colon is within normal limits. The remainder of the colon is unremarkable. The appendix is visualized and normal. The small bowel is within normal limits. The patient is status post hysterectomy. A low-density lesion associated with the left ovary measures 2.7 cm, likely a benign cyst. The right ovary is unremarkable. No significant adenopathy or free fluid is present.  The bone windows demonstrate no focal lytic or blastic lesions.  IMPRESSION: 1. New by about lateral  lower lobe airspace disease. This appears to be more  prominent than typical atelectasis and is concerning for early infection or potentially even aspiration. 2. Slight increase in intra and extra hepatic biliary dilation. No obstructing lesion is evident. MRCP could be used for further evaluation if clinically indicated. 3. Status post hysterectomy. A cystic lesion in the left ovary appears benign.   Electronically Signed   By: Gennette Pac M.D.   On: 07/25/2013 10:25   Assessment & Plan by Problem:  # Community Acquired Pneumonia: her symptoms of productive cough, fever and chest pain are likely due to pneumonia. CT findings are consistent with PNA.  Since patient was not in hospital in the past 3 months, most likely diagnosis is community-acquired pneumonia.  Other differential diagnoses include, but less likely, PE (low Well's score and probability) and aspiration (No AMS and pt strongly denies alcohol drinking). Flu PCR negative.  Plan: admit to Med-surg bed - start Levaquin IV -  Albuterol inhaler PRN - Tessalon for cough - Blood and suptum cultures - uds - CBC  in AM  #: Elevated AG: AG=23. Etiology is not clear. Likely due to infection. No history of diabetes. - Check lactic acid, acetaminophen level, and salicylate level - BMP in AM.   #Transaminitis/Abdominal pain: Patient reports that she was diagnosed with HCV in 03/2012. She was vaccinated for HAV and HBV. Her last LFT was fine on 07/13/13. This seems to be acute issue. She is s/p of cholecystectomy, making the cholecystitis are very unlikely the diagnosis. Elevated AST/ALT ratio seems to be related to alcohol abuse, but patient strongly deny alcohol drinking, and her MCV is normal which is inconsistent with chronic alcohol abuse. Other possibilities include side effects from recent azithromycin and acetaminophen. GI was consulted and plan to to MRCP  - Appreciate GIs consultation in management of our patient - d/c Azithromycin - No tylenol - NS @ 125 cc/hr - CMP in AM - Colace  PRN - Zofran PRN nausea - Clear liquid diet and NPO at midnight per GI - MRCP tomorrow per GI recs  #Hematuria: Patient with history of kidney stones and renal cysts. Cysts stable on CT. UA negative for UTI but +RBC and pt reports obstructive urinary symptoms. Has a history of bladder spasm diagnosed via urodynamics per patient report. +CVA tenderness on exam, L>R. Cr wnl. Dilation of R ureter on CT, may represent recently passed stone.  - NS @ 125/hr    #FEN/GI - Clear liquid diet - NPO at midnight for MRCP  #PPx - Heparin sq  Dispo: Disposition is deferred at this time, awaiting improvement of current medical problems. Anticipated discharge in approximately 2 to 3 day(s).   The patient does not have a current PCP (No PCP Per Patient), therefore is not requiring OPC follow-up after discharge.   The patient does not have transportation limitations that hinder transportation to clinic appointments.  Signed:  Lorretta Harp, MD PGY3, Internal Medicine Teaching Service Pager: [phone]  07/25/2013, 1:40 PM

## 2013-07-25 NOTE — ED Notes (Signed)
Report given to floor RN

## 2013-07-25 NOTE — ED Notes (Addendum)
Pt arrives via EMS. States 2 weeks ago she was prescribed z-pack for bronchitis. States vomitingx3 past 24 hours. Fever 102.3 tympanic. Non productive couch, general body aches, fatigue, difficulty urinating and kidney pains. No improvement since antibiotics. Hx freq kidney stones, chronic bronchitis. pt sates [facility] and prescribed tussinex and inhaler for URI vs flu.

## 2013-07-25 NOTE — ED Notes (Addendum)
Pt to remain NPO after midnight for MRI scan tomorrow. Asked by MRI

## 2013-07-25 NOTE — H&P (Signed)
I have seen the patient and reviewed HPI note by medical student, and discussed the care of the patient with them. Please see my HPI note for documentation of my findings, assessment, and plans  Lorretta HarpXilin Rashawnda Gaba, MD PGY3, Internal Medicine Teaching Service Pager: [phone]

## 2013-07-26 ENCOUNTER — Inpatient Hospital Stay (HOSPITAL_COMMUNITY): Payer: No Typology Code available for payment source

## 2013-07-26 DIAGNOSIS — R109 Unspecified abdominal pain: Secondary | ICD-10-CM

## 2013-07-26 DIAGNOSIS — J42 Unspecified chronic bronchitis: Secondary | ICD-10-CM

## 2013-07-26 DIAGNOSIS — R740 Nonspecific elevation of levels of transaminase and lactic acid dehydrogenase [LDH]: Secondary | ICD-10-CM

## 2013-07-26 DIAGNOSIS — B1920 Unspecified viral hepatitis C without hepatic coma: Secondary | ICD-10-CM

## 2013-07-26 DIAGNOSIS — R319 Hematuria, unspecified: Secondary | ICD-10-CM | POA: Diagnosis present

## 2013-07-26 DIAGNOSIS — R1011 Right upper quadrant pain: Secondary | ICD-10-CM

## 2013-07-26 DIAGNOSIS — R7401 Elevation of levels of liver transaminase levels: Secondary | ICD-10-CM | POA: Diagnosis present

## 2013-07-26 DIAGNOSIS — R1031 Right lower quadrant pain: Secondary | ICD-10-CM

## 2013-07-26 DIAGNOSIS — J329 Chronic sinusitis, unspecified: Secondary | ICD-10-CM

## 2013-07-26 LAB — LEGIONELLA ANTIGEN, URINE: Legionella Antigen, Urine: NEGATIVE

## 2013-07-26 LAB — COMPREHENSIVE METABOLIC PANEL
ALBUMIN: 3.2 g/dL — AB (ref 3.5–5.2)
ALK PHOS: 222 U/L — AB (ref 39–117)
ALT: 172 U/L — ABNORMAL HIGH (ref 0–35)
AST: 193 U/L — ABNORMAL HIGH (ref 0–37)
BUN: 6 mg/dL (ref 6–23)
CHLORIDE: 106 meq/L (ref 96–112)
CO2: 19 meq/L (ref 19–32)
Calcium: 8.2 mg/dL — ABNORMAL LOW (ref 8.4–10.5)
Creatinine, Ser: 0.66 mg/dL (ref 0.50–1.10)
GFR calc Af Amer: >90 mL/min (ref >90)
GLUCOSE: 81 mg/dL (ref 70–99)
POTASSIUM: 4 meq/L (ref 3.7–5.3)
Sodium: 138 mEq/L (ref 137–147)
Total Bilirubin: 0.4 mg/dL (ref 0.3–1.2)
Total Protein: 6.5 g/dL (ref 6.0–8.3)

## 2013-07-26 LAB — URINALYSIS, ROUTINE W REFLEX MICROSCOPIC
BILIRUBIN URINE: NEGATIVE
Glucose, UA: NEGATIVE mg/dL
KETONES UR: NEGATIVE mg/dL
Leukocytes, UA: NEGATIVE
NITRITE: NEGATIVE
Protein, ur: NEGATIVE mg/dL
Specific Gravity, Urine: 1.015 (ref 1.005–1.030)
UROBILINOGEN UA: 0.2 mg/dL (ref 0.0–1.0)
pH: 6 (ref 5.0–8.0)

## 2013-07-26 LAB — CBC
HCT: 32.1 % — ABNORMAL LOW (ref 36.0–46.0)
Hemoglobin: 11 g/dL — ABNORMAL LOW (ref 12.0–15.0)
MCH: 31.1 pg (ref 26.0–34.0)
MCHC: 34.3 g/dL (ref 30.0–36.0)
MCV: 90.7 fL (ref 78.0–100.0)
Platelets: 183 10*3/uL (ref 150–400)
RBC: 3.54 MIL/uL — ABNORMAL LOW (ref 3.87–5.11)
RDW: 13.2 % (ref 11.5–15.5)
WBC: 2.3 10*3/uL — AB (ref 4.0–10.5)

## 2013-07-26 LAB — EXPECTORATED SPUTUM ASSESSMENT W REFEX TO RESP CULTURE

## 2013-07-26 LAB — HIV ANTIBODY (ROUTINE TESTING W REFLEX): HIV: NONREACTIVE

## 2013-07-26 LAB — URINE MICROSCOPIC-ADD ON

## 2013-07-26 LAB — EXPECTORATED SPUTUM ASSESSMENT W GRAM STAIN, RFLX TO RESP C

## 2013-07-26 MED ORDER — CHLORHEXIDINE GLUCONATE 0.12 % MT SOLN
15.0000 mL | Freq: Two times a day (BID) | OROMUCOSAL | Status: DC
  Administered 2013-07-26: 15 mL via OROMUCOSAL
  Filled 2013-07-26 (×5): qty 15

## 2013-07-26 MED ORDER — GADOBENATE DIMEGLUMINE 529 MG/ML IV SOLN
20.0000 mL | Freq: Once | INTRAVENOUS | Status: AC | PRN
  Administered 2013-07-26: 17 mL via INTRAVENOUS

## 2013-07-26 MED ORDER — DIPHENHYDRAMINE HCL 25 MG PO CAPS
25.0000 mg | ORAL_CAPSULE | Freq: Four times a day (QID) | ORAL | Status: DC | PRN
  Administered 2013-07-26 – 2013-07-28 (×4): 25 mg via ORAL
  Filled 2013-07-26 (×4): qty 1

## 2013-07-26 MED ORDER — GUAIFENESIN-DM 100-10 MG/5ML PO SYRP
5.0000 mL | ORAL_SOLUTION | ORAL | Status: DC | PRN
  Administered 2013-07-26 – 2013-07-28 (×3): 5 mL via ORAL
  Filled 2013-07-26 (×4): qty 5

## 2013-07-26 MED ORDER — DICYCLOMINE HCL 20 MG PO TABS
20.0000 mg | ORAL_TABLET | Freq: Two times a day (BID) | ORAL | Status: DC
  Administered 2013-07-26 – 2013-07-29 (×6): 20 mg via ORAL
  Filled 2013-07-26 (×7): qty 1

## 2013-07-26 MED ORDER — BIOTENE DRY MOUTH MT LIQD: 15.0000 mL | Freq: Two times a day (BID) | OROMUCOSAL | Status: DC

## 2013-07-26 MED ORDER — BENZONATATE 100 MG PO CAPS
100.0000 mg | ORAL_CAPSULE | Freq: Three times a day (TID) | ORAL | Status: DC | PRN
  Administered 2013-07-27 – 2013-07-29 (×3): 100 mg via ORAL
  Filled 2013-07-26 (×3): qty 1

## 2013-07-26 MED ORDER — DOXYCYCLINE HYCLATE 100 MG PO TABS
100.0000 mg | ORAL_TABLET | Freq: Two times a day (BID) | ORAL | Status: DC
  Administered 2013-07-26 – 2013-07-29 (×7): 100 mg via ORAL
  Filled 2013-07-26 (×8): qty 1

## 2013-07-26 MED ORDER — DIPHENHYDRAMINE HCL 50 MG/ML IJ SOLN: 25.0000 mg | Freq: Four times a day (QID) | INTRAMUSCULAR | Status: DC | PRN

## 2013-07-26 NOTE — Progress Notes (Signed)
Subjective: Patient returned from MRCP this AM. This morning after getting IV Levaquin, experienced pruritis and erythema at the IV site. Stated that she was also itchy yesterday when getting Levaquin. Has been NPO since midnight. Had nausea overnight, received Zofran, still nauseaus but not as bad this AM. Wants to eat some bland foods. Last BM 2 days ago. Still reporting hesitancy with urination.   Objective: Vital signs in last 24 hours: Filed Vitals:   07/25/13 1600 07/25/13 1810 07/25/13 2302 07/26/13 0520  BP: 110/67 117/81 109/73 106/71  Pulse: 91 96 93 82  Temp:  98.8 F (37.1 C) 98.7 F (37.1 C) 97.5 F (36.4 C)  TempSrc:  Oral Oral Oral  Resp:  _0 Height:  _1  (1.727 m)    Weight:  95.255 kg (210 lb)    SpO2: 99% 92% 91% 90%   Weight change: 0 kg (0 lb)  Intake/Output Summary (Last 24 hours) at 07/26/13 0939 Last data filed at 07/25/13 2033  Gross per 24 hour  Intake 880.75 ml  Output      0 ml  Net 880.75 ml   BP 106/71  Pulse 82  Temp(Src) 97.5 F (36.4 C) (Oral)  Resp 18  Ht _2  (1.727 m)  Wt 95.255 kg (210 lb)  BMI 31.94 kg/m2  SpO2 90%  General appearance: alert, cooperative, fatigued and mild distress Neck: supple, symmetrical, trachea midline and thyroid not enlarged, symmetric, no tenderness/mass/nodules Lungs: Coarse breath sounds bilaterally, no wheeze or crackles Heart: regular rate and rhythm, S1, S2 normal, no murmur, click, rub or gallop Abdomen: Soft, +BS, tenderness to palpation in RUQ Extremities: Area of erythema approximately 2 inches in diameter around IV site in L antecubital fossa. No edema or erythema on LE.   Lab Results: Flu PCR Negative Na: 133- 138 Hgb: 11.0  WBC: 5.3 -> 2.3 Alk phos: 164 > 222 AST: 268 > 193 ALT: 154 > 762 HIV: pending Salicylates: Negative Acetaminophen level: Negative  Studies/Results: Ct Abdomen Pelvis W Contrast  07/25/2013   CLINICAL DATA:  Persistent right-sided abdominal pain.  EXAM:  CT ABDOMEN AND PELVIS WITH CONTRAST  TECHNIQUE: Multidetector CT imaging of the abdomen and pelvis was performed using the standard protocol following bolus administration of intravenous contrast.  CONTRAST:  61m OMNIPAQUE IOHEXOL 300 MG/ML  SOLN  COMPARISON:  CT abdomen and pelvis without contrast 09/03/2008. CT abdomen and pelvis with contrast 11/13/2007.  FINDINGS: Patchy ground-glass attenuation is present at the lung bases bilaterally, right greater than left. The heart size is normal. No significant pleural or pericardial effusion is present.  Intra and extra hepatic biliary dilation has increased. There is a low-density lesion in the right lobe of the liver on image 23 of series 2 which has increased in size, now measuring 17 mm. This represents either a cyst or likely a biloma. The extra hepatic biliary duct measures up to 15 mm. No obstructing lesion is evident.  The spleen is unremarkable. The stomach and duodenum are within normal limits. The pancreas is otherwise unremarkable. No mass lesion is evident. There is no significant dilation of the pancreatic duct. An 11 mm cyst on the right at 12 mm cyst in the left kidney are stable. The right ureter is mildly dilated to the point where crosses the right iliac artery, likely physiologic.  The rectosigmoid colon is within normal limits. The remainder of the colon is unremarkable. The appendix is visualized and normal. The small bowel is within normal limits.  The patient is status post hysterectomy. A low-density lesion associated with the left ovary measures 2.7 cm, likely a benign cyst. The right ovary is unremarkable. No significant adenopathy or free fluid is present.  The bone windows demonstrate no focal lytic or blastic lesions.  IMPRESSION: 1. New by about lateral lower lobe airspace disease. This appears to be more prominent than typical atelectasis and is concerning for early infection or potentially even aspiration. 2. Slight increase in intra and  extra hepatic biliary dilation. No obstructing lesion is evident. MRCP could be used for further evaluation if clinically indicated. 3. Status post hysterectomy. A cystic lesion in the left ovary appears benign.   Electronically Signed   By: Lawrence Santiago M.D.   On: 07/25/2013 10:25   Mr 3d Recon At Scanner  07/26/2013   CLINICAL DATA:  Abdominal pain. Chronic hepatitis-C. Biliary ductal dilatation seen on CT. Prior cholecystectomy.  EXAM: MRI ABDOMEN WITHOUT AND WITH CONTRAST (INCLUDING MRCP)  TECHNIQUE: Multiplanar multisequence MR imaging of the abdomen was performed both before and after the administration of intravenous contrast. Heavily T2-weighted images of the biliary and pancreatic ducts were obtained, and three-dimensional MRCP images were rendered by post processing.  CONTRAST:  69mL MULTIHANCE GADOBENATE DIMEGLUMINE 529 MG/ML IV SOLN  COMPARISON:  CT on 07/25/2013  FINDINGS: Bilateral lower lobe pulmonary infiltrates again demonstrated.  Mild diffuse intra and extrahepatic biliary ductal dilatation is seen, with the common bile duct measuring 11 mm. No evidence of choledocholithiasis. No evidence of common bile duct stricture. Patient has undergone previous cholecystectomy. No evidence of pancreatic ductal dilatation.  Several tiny hepatic cysts are noted, but no liver masses are identified. No evidence of pancreatic mass or peripancreatic inflammatory changes. The spleen and adrenal glands are normal in appearance. Small proteinaceous or hemorrhagic renal cysts are seen in the upper poles of both kidneys (these Bosniak category 2), however there is no evidence of renal neoplasm or hydronephrosis.  No other abdominal soft tissue masses or lymphadenopathy identified. No evidence of inflammatory process or abnormal fluid collections.  IMPRESSION: Mild diffuse biliary dilatation with common bile duct measuring 11 mm. No evidence of choledocholithiasis or other obstructing etiology by MRI. This may be  related to prior cholecystectomy.  Small benign hepatic and renal cysts.  No evidence of neoplasm.  Bilateral lower lobe infiltrates again noted.   Electronically Signed   By: Earle Gell M.D.   On: 07/26/2013 11:48   Mr Abd W/wo Cm/mrcp  07/26/2013   CLINICAL DATA:  Abdominal pain. Chronic hepatitis-C. Biliary ductal dilatation seen on CT. Prior cholecystectomy.  EXAM: MRI ABDOMEN WITHOUT AND WITH CONTRAST (INCLUDING MRCP)  TECHNIQUE: Multiplanar multisequence MR imaging of the abdomen was performed both before and after the administration of intravenous contrast. Heavily T2-weighted images of the biliary and pancreatic ducts were obtained, and three-dimensional MRCP images were rendered by post processing.  CONTRAST:  2mL MULTIHANCE GADOBENATE DIMEGLUMINE 529 MG/ML IV SOLN  COMPARISON:  CT on 07/25/2013  FINDINGS: Bilateral lower lobe pulmonary infiltrates again demonstrated.  Mild diffuse intra and extrahepatic biliary ductal dilatation is seen, with the common bile duct measuring 11 mm. No evidence of choledocholithiasis. No evidence of common bile duct stricture. Patient has undergone previous cholecystectomy. No evidence of pancreatic ductal dilatation.  Several tiny hepatic cysts are noted, but no liver masses are identified. No evidence of pancreatic mass or peripancreatic inflammatory changes. The spleen and adrenal glands are normal in appearance. Small proteinaceous or hemorrhagic renal cysts are seen in  the upper poles of both kidneys (these Bosniak category 2), however there is no evidence of renal neoplasm or hydronephrosis.  No other abdominal soft tissue masses or lymphadenopathy identified. No evidence of inflammatory process or abnormal fluid collections.  IMPRESSION: Mild diffuse biliary dilatation with common bile duct measuring 11 mm. No evidence of choledocholithiasis or other obstructing etiology by MRI. This may be related to prior cholecystectomy.  Small benign hepatic and renal cysts.   No evidence of neoplasm.  Bilateral lower lobe infiltrates again noted.   Electronically Signed   By: Earle Gell M.D.   On: 07/26/2013 11:48   Medications: I have reviewed the patient's current medications. Scheduled Meds: . amitriptyline  50 mg Oral QHS  . antiseptic oral rinse  15 mL Mouth Rinse q12n4p  . chlorhexidine  15 mL Mouth Rinse BID  . citalopram  40 mg Oral Daily  . heparin  5,000 Units Subcutaneous Q8H  . levofloxacin (LEVAQUIN) IV  750 mg Intravenous Q24H  . loratadine  10 mg Oral Daily   Continuous Infusions: . sodium chloride 125 mL/hr at 07/25/13 1539   PRN Meds:.albuterol, chlorpheniramine-HYDROcodone, ibuprofen, ondansetron (ZOFRAN) IV  Assessment/Plan: Principal Problem:   CAP (community acquired pneumonia) Active Problems:   Hepatitis C   Chronic bronchitis   Renal disorder   Abdominal pain   Nonspecific (abnormal) findings on radiological and other examination of biliary tract   Nonspecific elevation of levels of transaminase or lactic acid dehydrogenase (LDH)  #Community Acquired Pneumonia  Patient on Azithromycin PO now with worsening cough, fever/chills, nausea/vomiting and CT demonstrating bilateral airspace disease. Flu PCR negative. Lactic acid 0.9. Afebrile since admission. On Levaquin IV from 1/15-1/16 but developed erythema and pruritis at IV site.  - f/u Blood cultures - d/c Levaquin - Benadryl 25 mg PO - Start doxy to cover CAP/sinusitis - Albuterol inhaler PRN  - Tessalon for cough  #Transaminitis/Abdominal pain  Elevated AST/ALT, wnl on 07/13/13. Patient denies EtOH use. May be 2/2 Azithromycin and Tylenol use in the setting of Hepatitis C and concomitant infection. S/p cholecystectomy 6-7 years ago. MRCP negative without evidence of obstruction. Patient reports a history for ~6 months of symptoms of frequent stools and intermittent constipation with mucous covered stool, on Bentyl.  - d/c Azithromycin - No tylenol, use Ibuprofen for fever  -  Holding home Bentyl  - NS @ 125 cc/hr - Continue to trend LFTs - Zofran PRN nausea   #Hematuria and urinary retention  Patient with history of kidney stones and renal cysts. Cysts stable on CT. Denies dysuria, UA negative for UTI but +RBC and pt reports obstructive urinary symptoms. Has a history of bladder spasm diagnosed via urodynamics per patient report. +CVA tenderness on exam, L>R. Cr wnl. Dilation of R ureter on CT, may represent recently passed stone. Cr .66 this AM.  - NS @ 125/hr  - Repeat UA - Consider urology consult if persistent hematuria. Pt not insured, not likely to get cysto as outpatient.   #FEN/GI  Criss Rosales diet - ADAT  #PPx  - Heparin   #Dispo  - Admit to floor  This is a Careers information officer Note.  The care of the patient was discussed with Dr. Aundra Dubin and the assessment and plan formulated with their assistance.  Please see their attached note for official documentation of the daily encounter.   LOS: 1 day   Carola Rhine, Med Student 07/26/2013, 8:40 AM

## 2013-07-26 NOTE — Progress Notes (Addendum)
Subjective: Pt does not feel well.  She is still with cough, denies myalgias, still with nausea w/o vomiting, denies fever/chills.  She still has sinus pain and pain in RUQ.  She also has nasal congestion with yellow green color. Today she was itching with the infusion of Levaquin and iv site was red.   Objective: Vital signs in last 24 hours: Filed Vitals:   07/25/13 1600 07/25/13 1810 07/25/13 2302 07/26/13 0520  BP: 110/67 117/81 109/73 106/71  Pulse: 91 96 93 82  Temp:  98.8 F (37.1 C) 98.7 F (37.1 C) 97.5 F (36.4 C)  TempSrc:  Oral Oral Oral  Resp:  18 18 18   Height:  5\' 8"  (1.727 m)    Weight:  210 lb (95.255 kg)    SpO2: 99% 92% 91% 90%   Weight change: 0 lb (0 kg)  Intake/Output Summary (Last 24 hours) at 07/26/13 1418 Last data filed at 07/26/13 1300  Gross per 24 hour  Intake 3280.75 ml  Output      0 ml  Net 3280.75 ml   Vitals reviewed. General: resting in bed, NAD HEENT: [place]/at, no scleral icterus Cardiac: RRR, no rubs, murmurs or gallops Pulm: clear to auscultation bilaterally, no wheezes, rales, or rhonchi Abd: soft, mildly tender RUQ, nondistended, BS present Ext: warm and well perfused, no pedal edema;  Left arm iv site with erythema Neuro: alert and oriented X3, cranial nerves II-XII grossly intact  Lab Results: Basic Metabolic Panel:  Recent Labs Lab 07/25/13 0650 07/26/13 0555  NA 133* 138  K 4.0 4.0  CL 97 106  CO2 23 19  GLUCOSE 103* 81  BUN 8 6  CREATININE 0.68 0.66  CALCIUM 8.9 8.2*   Liver Function Tests:  Recent Labs Lab 07/25/13 0650 07/26/13 0555  AST 268* 193*  ALT 154* 172*  ALKPHOS 164* 222*  BILITOT 0.7 0.4  PROT 7.6 6.5  ALBUMIN 3.8 3.2*    Recent Labs Lab 07/25/13 0650  LIPASE 20   No results found for this basename: AMMONIA,  in the last 168 hours CBC:  Recent Labs Lab 07/25/13 0650 07/26/13 0555  WBC 5.3 2.3*  NEUTROABS 4.0  --   HGB 11.8* 11.0*  HCT 35.1* 32.1*  MCV 92.1 90.7  PLT 201 183    Coagulation:  Recent Labs Lab 07/25/13 1936  LABPROT 12.7  INR 0.97   Urine Drug Screen: Drugs of Abuse     Component Value Date/Time   LABOPIA NONE DETECTED 07/25/2013 2038   COCAINSCRNUR NONE DETECTED 07/25/2013 2038   LABBENZ NONE DETECTED 07/25/2013 2038   AMPHETMU NONE DETECTED 07/25/2013 2038   THCU NONE DETECTED 07/25/2013 2038   LABBARB NONE DETECTED 07/25/2013 2038    Urinalysis:  Recent Labs Lab 07/25/13 0811  COLORURINE YELLOW  LABSPEC 1.015  PHURINE 7.5  GLUCOSEU NEGATIVE  HGBUR SMALL*  BILIRUBINUR NEGATIVE  KETONESUR NEGATIVE  PROTEINUR NEGATIVE  UROBILINOGEN 0.2  NITRITE NEGATIVE  LEUKOCYTESUR NEGATIVE   Misc. Labs: None   Micro Results: No results found for this or any previous visit (from the past 240 hour(s)). Studies/Results: Ct Abdomen Pelvis W Contrast  07/25/2013   CLINICAL DATA:  Persistent right-sided abdominal pain.  EXAM: CT ABDOMEN AND PELVIS WITH CONTRAST  TECHNIQUE: Multidetector CT imaging of the abdomen and pelvis was performed using the standard protocol following bolus administration of intravenous contrast.  CONTRAST:  80mL OMNIPAQUE IOHEXOL 300 MG/ML  SOLN  COMPARISON:  CT abdomen and pelvis without contrast 09/03/2008. CT  abdomen and pelvis with contrast 11/13/2007.  FINDINGS: Patchy ground-glass attenuation is present at the lung bases bilaterally, right greater than left. The heart size is normal. No significant pleural or pericardial effusion is present.  Intra and extra hepatic biliary dilation has increased. There is a low-density lesion in the right lobe of the liver on image 23 of series 2 which has increased in size, now measuring 17 mm. This represents either a cyst or likely a biloma. The extra hepatic biliary duct measures up to 15 mm. No obstructing lesion is evident.  The spleen is unremarkable. The stomach and duodenum are within normal limits. The pancreas is otherwise unremarkable. No mass lesion is evident. There is no  significant dilation of the pancreatic duct. An 11 mm cyst on the right at 12 mm cyst in the left kidney are stable. The right ureter is mildly dilated to the point where crosses the right iliac artery, likely physiologic.  The rectosigmoid colon is within normal limits. The remainder of the colon is unremarkable. The appendix is visualized and normal. The small bowel is within normal limits. The patient is status post hysterectomy. A low-density lesion associated with the left ovary measures 2.7 cm, likely a benign cyst. The right ovary is unremarkable. No significant adenopathy or free fluid is present.  The bone windows demonstrate no focal lytic or blastic lesions.  IMPRESSION: 1. New by about lateral lower lobe airspace disease. This appears to be more prominent than typical atelectasis and is concerning for early infection or potentially even aspiration. 2. Slight increase in intra and extra hepatic biliary dilation. No obstructing lesion is evident. MRCP could be used for further evaluation if clinically indicated. 3. Status post hysterectomy. A cystic lesion in the left ovary appears benign.   Electronically Signed   By: Gennette Pac M.D.   On: 07/25/2013 10:25   Mr 3d Recon At Scanner  07/26/2013   CLINICAL DATA:  Abdominal pain. Chronic hepatitis-C. Biliary ductal dilatation seen on CT. Prior cholecystectomy.  EXAM: MRI ABDOMEN WITHOUT AND WITH CONTRAST (INCLUDING MRCP)  TECHNIQUE: Multiplanar multisequence MR imaging of the abdomen was performed both before and after the administration of intravenous contrast. Heavily T2-weighted images of the biliary and pancreatic ducts were obtained, and three-dimensional MRCP images were rendered by post processing.  CONTRAST:  17mL MULTIHANCE GADOBENATE DIMEGLUMINE 529 MG/ML IV SOLN  COMPARISON:  CT on 07/25/2013  FINDINGS: Bilateral lower lobe pulmonary infiltrates again demonstrated.  Mild diffuse intra and extrahepatic biliary ductal dilatation is seen, with  the common bile duct measuring 11 mm. No evidence of choledocholithiasis. No evidence of common bile duct stricture. Patient has undergone previous cholecystectomy. No evidence of pancreatic ductal dilatation.  Several tiny hepatic cysts are noted, but no liver masses are identified. No evidence of pancreatic mass or peripancreatic inflammatory changes. The spleen and adrenal glands are normal in appearance. Small proteinaceous or hemorrhagic renal cysts are seen in the upper poles of both kidneys (these Bosniak category 2), however there is no evidence of renal neoplasm or hydronephrosis.  No other abdominal soft tissue masses or lymphadenopathy identified. No evidence of inflammatory process or abnormal fluid collections.  IMPRESSION: Mild diffuse biliary dilatation with common bile duct measuring 11 mm. No evidence of choledocholithiasis or other obstructing etiology by MRI. This may be related to prior cholecystectomy.  Small benign hepatic and renal cysts.  No evidence of neoplasm.  Bilateral lower lobe infiltrates again noted.   Electronically Signed   By: Jonny Ruiz  Eppie Gibson M.D.   On: 07/26/2013 11:48   Mr Abd W/wo Cm/mrcp  07/26/2013   CLINICAL DATA:  Abdominal pain. Chronic hepatitis-C. Biliary ductal dilatation seen on CT. Prior cholecystectomy.  EXAM: MRI ABDOMEN WITHOUT AND WITH CONTRAST (INCLUDING MRCP)  TECHNIQUE: Multiplanar multisequence MR imaging of the abdomen was performed both before and after the administration of intravenous contrast. Heavily T2-weighted images of the biliary and pancreatic ducts were obtained, and three-dimensional MRCP images were rendered by post processing.  CONTRAST:  17mL MULTIHANCE GADOBENATE DIMEGLUMINE 529 MG/ML IV SOLN  COMPARISON:  CT on 07/25/2013  FINDINGS: Bilateral lower lobe pulmonary infiltrates again demonstrated.  Mild diffuse intra and extrahepatic biliary ductal dilatation is seen, with the common bile duct measuring 11 mm. No evidence of choledocholithiasis.  No evidence of common bile duct stricture. Patient has undergone previous cholecystectomy. No evidence of pancreatic ductal dilatation.  Several tiny hepatic cysts are noted, but no liver masses are identified. No evidence of pancreatic mass or peripancreatic inflammatory changes. The spleen and adrenal glands are normal in appearance. Small proteinaceous or hemorrhagic renal cysts are seen in the upper poles of both kidneys (these Bosniak category 2), however there is no evidence of renal neoplasm or hydronephrosis.  No other abdominal soft tissue masses or lymphadenopathy identified. No evidence of inflammatory process or abnormal fluid collections.  IMPRESSION: Mild diffuse biliary dilatation with common bile duct measuring 11 mm. No evidence of choledocholithiasis or other obstructing etiology by MRI. This may be related to prior cholecystectomy.  Small benign hepatic and renal cysts.  No evidence of neoplasm.  Bilateral lower lobe infiltrates again noted.   Electronically Signed   By: Myles Rosenthal M.D.   On: 07/26/2013 11:48   Medications:  Scheduled Meds: . amitriptyline  50 mg Oral QHS  . antiseptic oral rinse  15 mL Mouth Rinse q12n4p  . chlorhexidine  15 mL Mouth Rinse BID  . citalopram  40 mg Oral Daily  . doxycycline  100 mg Oral Q12H  . heparin  5,000 Units Subcutaneous Q8H  . loratadine  10 mg Oral Daily   Continuous Infusions: . sodium chloride 125 mL/hr at 07/25/13 1539   PRN Meds:.albuterol, chlorpheniramine-HYDROcodone, diphenhydrAMINE, diphenhydrAMINE, ibuprofen, ondansetron (ZOFRAN) IV Assessment/Plan: 43 y.o with PMH chronic bronchitis/COPD, HCV, cholecystectomy, cystic kidneys, h/o kidney stones, depression/anxiety, GERD.  She presents for fever,chills, cough, nausea/vomiting, RUQ abdominal pain found to have infiltrates on imaging and transaminitis.    #CAP (community acquired pneumonia) -HIV negative. Legionella negative, pending blood cultures  -Changed Levaquin to  Doxycycline due to allergic Rx itching.  Given prn Benadryl  -will ambulate with pulse ox in the am -prn Albuterol neb, NS 125 cc/hr, Tussionex for cough, prn Zofran for postussive vomiting   #sinusitis  -Changed Levaquin to Doxycycline due to allergic Rx itching.  Given prn Benadryl    #Hepatitis C -will need to f/u outpt with PCP and possibly GI/ID referral -pending Hep panel and quant  #RUQ Abdominal pain and Transaminitis -s.p gallbladder removal; Etiology may due to HCV, recent AZM (Monday-Thurs)/Tylenol use.  H/o ?IBS -GI consulted agree with MRCP which showed ductal dilatation is related to previous cholecystectomy -trend CMET, CBC -Avoid liver toxic medications  -Continued Bentyl  #Hematuria, chronic -will repeat UA -will likely need to f/u with urology outpatient with h/o chronic RBC urine and kidney stones   #F/E/N -NS 125 cc/hr  -CMET, CBC in am -cardiac diet   #DVT px  -Hep, scds   Dispo: Disposition is deferred at  this time, awaiting improvement of current medical problems.  Anticipated discharge in approximately 2-3 day(s).   The patient does not have a current PCP (No Pcp Per Patient) and [facility] with list of resources given.  The patient does have transportation limitations that hinder transportation to clinic appointments.  .Services Needed at time of discharge: Y = Yes, Blank = No PT:   OT:   RN:   Equipment:   Other:     LOS: 1 day   Annett Gularacy N McLean, MD [phone] 07/26/2013, 2:18 PM

## 2013-07-26 NOTE — Progress Notes (Signed)
EKG completed. Placed in pt chart.

## 2013-07-26 NOTE — ED Provider Notes (Signed)
Medical screening examination/treatment/procedure(s) were performed by non-physician practitioner and as supervising physician I was immediately available for consultation/collaboration.  EKG Interpretation    Date/Time:    Ventricular Rate:    PR Interval:    QRS Duration:   QT Interval:    QTC Calculation:   R Axis:     Text Interpretation:                Loren Raceravid Eastyn Dattilo, MD 07/26/13 [id number]

## 2013-07-26 NOTE — Progress Notes (Signed)
Utilization review completed. Brookelyn Gaynor, RN, BSN. 

## 2013-07-26 NOTE — Progress Notes (Signed)
MRI called, will pick up pt in approximately one hour.

## 2013-07-26 NOTE — ED Provider Notes (Signed)
Medical screening examination/treatment/procedure(s) were performed by non-physician practitioner and as supervising physician I was immediately available for consultation/collaboration.  EKG Interpretation    Date/Time:  Tuesday July 23 2013 12:22:55 EST Ventricular Rate:  124 PR Interval:  128 QRS Duration: 91 QT Interval:  318 QTC Calculation: 457 R Axis:   88 Text Interpretation:  Sinus tachycardia Low voltage, precordial leads Borderline T wave abnormalities ED PHYSICIAN INTERPRETATION AVAILABLE IN [facility] Confirmed by TEST, RECORD ([id number]) on 07/25/2013 12:04:27 PM              Richardean Canalavid H Faraz Ponciano, MD 07/26/13 [phone]

## 2013-07-26 NOTE — H&P (Signed)
INTERNAL MEDICINE TEACHING SERVICE Attending Admission Note  Date: 07/26/2013  Patient name: Karen Baldwin  Medical record number: [id number]  Date of birth: March 09, 1971    I have seen and evaluated [name] and discussed their care with the Residency Team.  43 yr old female with hx Hep C, tobacco abuse, hx cholecystectomy, chronic bronchitis (I suspect COPD), nephrolithiasis, presented with fever, chills, N/V/abdominal pain. She admits to a recent incarceration.  No hemoptysis. She admits to cough (non productive) and facial pain since early this month. She was given a dx of viral infection and told to wait it out. She continued to have symptoms and then was treated with azithromycin. She has continued to have fever, chills, dry cough. She admits to SOB, sharp chest pain. She admits to a chronic hx of vague abdominal pain past few months. Admits to post-tussive vomiting. In the ED, she was noted to have an elevated AST/ALT and GI was involved by the ED.  On exam, she was noted to be initially febrile. She had dry mucous membranes. CV: S1S2, no m/r/g, RRR PULM: decreased breath sounds, mild exp wheeze.  LFTs notable for AST 268/ALT 154/ALP 154/ Tbili 0.7. WBC initially 5.3. A CT of the abdomen performed showed lateral lower lobe airspace disease. She had some slight ductal dilatation which triggered an MRCP order. Of note, he Tbili is normal. AST noted to be 193 this morning, ALT 172, and ALP 222, with a continued normal total bili of 0.4. She has developed a leukopenia and no thrombocytopenia noted. I note her negative Flu test. Given her presentation, I agree with tx for CAP. She has had some itchiness with levaquin. May change to doxycycline. She has failed azithro therapy. Given her lung hx, she may require prednisone if she does not improve over next day. Another consideration is whether or not she has Influenza. She may have at the beginning of the month and is slowly improving. I would not  add tamiflu at this time. More likely to be bacterial infection at this point.   Her LFT elevation is likely due to medication use (azithro). I doubt this is Hepatitis C related, though given her sick state it is not unlikely that more hepatic inflammation would occur given the immune challenge of CAP. Her ductal dilatation is related to previous cholecystectomy. I doubt any obstruction at this time.  Jonah BlueAlejandro Izic Stfort, DO, FACP Faculty [facility] 07/26/2013, 12:53 PM

## 2013-07-26 NOTE — Progress Notes (Signed)
Daily Rounding Note  07/26/2013, 11:55 AM  LOS: 1 day   SUBJECTIVE:    MRCP completed. Still has pain in RUQ. Not using a lot of the prn Dilaudid. Some nausea but would like potatoes and crackers.  No vomiting. Last BM 07/24/13  OBJECTIVE:         Vital signs in last 24 hours:    Temp:  [97.5 F (36.4 C)-98.8 F (37.1 C)] 97.5 F (36.4 C) (01/16 0520) Pulse Rate:  [76-105] 82 (01/16 0520) Resp:  [18-36] 18 (01/16 0520) BP: (97-117)/(64-81) 106/71 mmHg (01/16 0520) SpO2:  [90 %-99 %] 90 % (01/16 0520) Weight:  [95.255 kg (210 lb)] 95.255 kg (210 lb) (01/15 1810) Last BM Date: 07/24/13 General: non-toxic, comfortable    Heart: RRR ENT: + Congestion, sniffling Chest: clear in front.    Abdomen: soft, tender mildly in RUQ.  BS hypoactive  Extremities: no CCE Neuro/Psych:  Oriented x 3.  No tremor, no limb weakness.   Intake/Output from previous day: 01/15 0701 - 01/16 0700 In: 880.8 [P.O.:462; I.V.:418.8] Out: -   Intake/Output this shift:    Lab Results:  Recent Labs  07/25/13 0650 07/26/13 0555  WBC 5.3 2.3*  HGB 11.8* 11.0*  HCT 35.1* 32.1*  PLT 201 183   BMET  Recent Labs  07/25/13 0650 07/26/13 0555  NA 133* 138  K 4.0 4.0  CL 97 106  CO2 23 19  GLUCOSE 103* 81  BUN 8 6  CREATININE 0.68 0.66  CALCIUM 8.9 8.2*   LFT  Recent Labs  07/25/13 0650 07/26/13 0555  PROT 7.6 6.5  ALBUMIN 3.8 3.2*  AST 268* 193*  ALT 154* 172*  ALKPHOS 164* 222*  BILITOT 0.7 0.4   PT/INR  Recent Labs  07/25/13 1936  LABPROT 12.7  INR 0.97   Hepatitis Panel No results found for this basename: HEPBSAG, HCVAB, HEPAIGM, HEPBIGM,  in the last 72 hours  Studies/Results: Ct Abdomen Pelvis W Contrast 07/25/2013   .  FINDINGS: Patchy ground-glass attenuation is present at the lung bases bilaterally, right greater than left. The heart size is normal. No significant pleural or pericardial effusion is  present.  Intra and extra hepatic biliary dilation has increased. There is a low-density lesion in the right lobe of the liver on image 23 of series 2 which has increased in size, now measuring 17 mm. This represents either a cyst or likely a biloma. The extra hepatic biliary duct measures up to 15 mm. No obstructing lesion is evident.  The spleen is unremarkable. The stomach and duodenum are within normal limits. The pancreas is otherwise unremarkable. No mass lesion is evident. There is no significant dilation of the pancreatic duct. An 11 mm cyst on the right at 12 mm cyst in the left kidney are stable. The right ureter is mildly dilated to the point where crosses the right iliac artery, likely physiologic.  The rectosigmoid colon is within normal limits. The remainder of the colon is unremarkable. The appendix is visualized and normal. The small bowel is within normal limits. The patient is status post hysterectomy. A low-density lesion associated with the left ovary measures 2.7 cm, likely a benign cyst. The right ovary is unremarkable. No significant adenopathy or free fluid is present.  The bone windows demonstrate no focal lytic or blastic lesions.  IMPRESSION: 1. New by about lateral lower lobe airspace disease. This appears to be more prominent than typical atelectasis and  is concerning for early infection or potentially even aspiration. 2. Slight increase in intra and extra hepatic biliary dilation. No obstructing lesion is evident. MRCP could be used for further evaluation if clinically indicated. 3. Status post hysterectomy. A cystic lesion in the left ovary appears benign.   Electronically Signed   By: Lawrence Santiago M.D.   On: 07/25/2013 10:25   Mr 3d Recon At Scanner Mr Abd W/wo Cm/mrcp 07/26/2013  FINDINGS: Bilateral lower lobe pulmonary infiltrates again demonstrated.  Mild diffuse intra and extrahepatic biliary ductal dilatation is seen, with the common bile duct measuring 11 mm. No evidence of  choledocholithiasis. No evidence of common bile duct stricture. Patient has undergone previous cholecystectomy. No evidence of pancreatic ductal dilatation.  Several tiny hepatic cysts are noted, but no liver masses are identified. No evidence of pancreatic mass or peripancreatic inflammatory changes. The spleen and adrenal glands are normal in appearance. Small proteinaceous or hemorrhagic renal cysts are seen in the upper poles of both kidneys (these Bosniak category 2), however there is no evidence of renal neoplasm or hydronephrosis.  No other abdominal soft tissue masses or lymphadenopathy identified. No evidence of inflammatory process or abnormal fluid collections.  IMPRESSION: Mild diffuse biliary dilatation with common bile duct measuring 11 mm. No evidence of choledocholithiasis or other obstructing etiology by MRI. This may be related to prior cholecystectomy.  Small benign hepatic and renal cysts.  No evidence of neoplasm.  Bilateral lower lobe infiltrates again noted.   Electronically Signed   By: Earle Gell M.D.   On: 07/26/2013 11:48   Scheduled Meds: . amitriptyline  50 mg Oral QHS  . antiseptic oral rinse  15 mL Mouth Rinse q12n4p  . chlorhexidine  15 mL Mouth Rinse BID  . citalopram  40 mg Oral Daily  . heparin  5,000 Units Subcutaneous Q8H  . levofloxacin (LEVAQUIN) IV  750 mg Intravenous Q24H  . loratadine  10 mg Oral Daily   Continuous Infusions: . sodium chloride 125 mL/hr at 07/25/13 1539   PRN Meds:.albuterol, chlorpheniramine-HYDROcodone, ibuprofen, ondansetron (ZOFRAN) IV  ASSESMENT:   * Chronic abdominal pain, newly elevated LFTs along with N/V in pt with Hepatitis C. MRCP with mild, diffuse ductal dilatation. Hepatic and renal cysts.  Nothing worrisome.   Zithromax since 07/21/13 -  07/25/14, now discontinued.  The LFT changes may be may be drug induced hepatitis. Transaminases improved, alk phos rising.  * Sinus infection, started Zithromax 05/21/14. CXR 07/23/13  unremarkable.  Interval development of bil pneumonia by 07/25/14 CT scan. Zithromax stopped, Levaquin initiated 05/25/14.  * Hx nephrolithiasis. Small amount of RBCs on UA. Renal cysts, no stones and right ureteral dilatation on CTscan.  * Hyponatremia, resolved.     PLAN   *  Ok to trial bland diet. *  Follow LFTs *  Should we check Hep C genotype and viral load? May also want to check hepatitis serologies for A and B to confirm her vaccination to these    Azucena Freed  07/26/2013, 11:55 AM Pager: [phone]    Attending physician's note   I have taken an interval history, reviewed the chart and examined the patient. I agree with the Advanced Practitioner's note, impression and recommendations. MRCP show only CBD dilation which is frequently noted post cholecystectomy. She has a hepatocellular process. LFT trend improving, suspected drug induced injury from Zithromax or a reactive process. Monitor LFTs for now. Obtain Hep C genotype and viral load for consideration of outpatient therapy. Her chronic abdominal  pain and alternating bowel habits are typical of IBS.   Pricilla Riffle. Fuller Plan, MD [facility]

## 2013-07-26 NOTE — Progress Notes (Addendum)
  Agree with med student note. See my note for more details  Shirlee LatchMcLean MD

## 2013-07-26 NOTE — Progress Notes (Signed)
Pt asked to take a shower. Paged MD on call. Orders given.

## 2013-07-27 DIAGNOSIS — R0902 Hypoxemia: Secondary | ICD-10-CM | POA: Diagnosis not present

## 2013-07-27 LAB — CBC WITH DIFFERENTIAL/PLATELET
BASOS ABS: 0 10*3/uL (ref 0.0–0.1)
Basophils Relative: 1 % (ref 0–1)
EOS ABS: 0.2 10*3/uL (ref 0.0–0.7)
Eosinophils Relative: 7 % — ABNORMAL HIGH (ref 0–5)
HCT: 31.9 % — ABNORMAL LOW (ref 36.0–46.0)
HEMOGLOBIN: 10.8 g/dL — AB (ref 12.0–15.0)
LYMPHS PCT: 56 % — AB (ref 12–46)
Lymphs Abs: 2 10*3/uL (ref 0.7–4.0)
MCH: 30.9 pg (ref 26.0–34.0)
MCHC: 33.9 g/dL (ref 30.0–36.0)
MCV: 91.1 fL (ref 78.0–100.0)
MONO ABS: 0.3 10*3/uL (ref 0.1–1.0)
Monocytes Relative: 9 % (ref 3–12)
NEUTROS PCT: 27 % — AB (ref 43–77)
Neutro Abs: 0.9 10*3/uL — ABNORMAL LOW (ref 1.7–7.7)
PLATELETS: 199 10*3/uL (ref 150–400)
RBC: 3.5 MIL/uL — ABNORMAL LOW (ref 3.87–5.11)
RDW: 13.3 % (ref 11.5–15.5)
WBC: 3.4 10*3/uL — ABNORMAL LOW (ref 4.0–10.5)

## 2013-07-27 LAB — COMPREHENSIVE METABOLIC PANEL
ALT: 114 U/L — ABNORMAL HIGH (ref 0–35)
AST: 81 U/L — AB (ref 0–37)
Albumin: 3.1 g/dL — ABNORMAL LOW (ref 3.5–5.2)
Alkaline Phosphatase: 176 U/L — ABNORMAL HIGH (ref 39–117)
BUN: 10 mg/dL (ref 6–23)
CALCIUM: 8 mg/dL — AB (ref 8.4–10.5)
CHLORIDE: 110 meq/L (ref 96–112)
CO2: 21 meq/L (ref 19–32)
Creatinine, Ser: 0.62 mg/dL (ref 0.50–1.10)
GLUCOSE: 82 mg/dL (ref 70–99)
Potassium: 4.3 mEq/L (ref 3.7–5.3)
SODIUM: 143 meq/L (ref 137–147)
Total Bilirubin: <0.2 mg/dL — ABNORMAL LOW (ref 0.3–1.2)
Total Protein: 6.6 g/dL (ref 6.0–8.3)

## 2013-07-27 MED ORDER — ALBUTEROL SULFATE (2.5 MG/3ML) 0.083% IN NEBU
2.5000 mg | INHALATION_SOLUTION | Freq: Four times a day (QID) | RESPIRATORY_TRACT | Status: DC
  Administered 2013-07-27 – 2013-07-28 (×2): 2.5 mg via RESPIRATORY_TRACT
  Filled 2013-07-27 (×3): qty 3

## 2013-07-27 MED ORDER — ONDANSETRON HCL 4 MG/2ML IJ SOLN: 4.0000 mg | Freq: Three times a day (TID) | INTRAMUSCULAR | Status: DC | PRN

## 2013-07-27 MED ORDER — TRAMADOL HCL 50 MG PO TABS: 50.0000 mg | ORAL_TABLET | Freq: Two times a day (BID) | ORAL | Status: DC | PRN

## 2013-07-27 MED ORDER — IBUPROFEN 400 MG PO TABS
400.0000 mg | ORAL_TABLET | Freq: Three times a day (TID) | ORAL | Status: DC | PRN
  Administered 2013-07-27 – 2013-07-28 (×2): 400 mg via ORAL
  Filled 2013-07-27 (×2): qty 1

## 2013-07-27 MED ORDER — SALINE SPRAY 0.65 % NA SOLN
1.0000 | NASAL | Status: DC | PRN
  Filled 2013-07-27: qty 44

## 2013-07-27 MED ORDER — OXYMETAZOLINE HCL 0.05 % NA SOLN
2.0000 | Freq: Two times a day (BID) | NASAL | Status: DC
  Administered 2013-07-27 – 2013-07-29 (×5): 2 via NASAL
  Filled 2013-07-27: qty 15

## 2013-07-27 MED ORDER — ONDANSETRON HCL 4 MG PO TABS
4.0000 mg | ORAL_TABLET | Freq: Three times a day (TID) | ORAL | Status: DC | PRN
  Administered 2013-07-27 – 2013-07-29 (×4): 4 mg via ORAL
  Filled 2013-07-27 (×4): qty 1

## 2013-07-27 NOTE — Progress Notes (Signed)
Progress Note   Subjective  Feeling better today.    Objective  Vital signs in last 24 hours: Temp:  [97.8 F (36.6 C)-98.6 F (37 C)] 97.8 F (36.6 C) (01/17 0521) Pulse Rate:  [79-88] 79 (01/17 0521) Resp:  [18] 18 (01/17 0521) BP: (103-112)/(74-78) 103/74 mmHg (01/17 0521) SpO2:  [93 %-95 %] 93 % (01/17 0521) Last BM Date: 07/24/13 General:   Alert, well-developed, white female in NAD Heart:  Regular rate and rhythm; no murmurs Abdomen:  Soft, nontender and nondistended. Normal bowel sounds, without guarding, and without rebound.   Extremities:  Without edema. Neurologic:  Alert and  oriented x4;  grossly normal neurologically. Psych:  Alert and cooperative. Normal mood and affect.  Intake/Output from previous day: 01/16 0701 - 01/17 0700 In: 4880 [P.O.:480; I.V.:4250; IV Piggyback:150] Out: -  Intake/Output this shift:    Lab Results:  Recent Labs  07/25/13 0650 07/26/13 0555 07/27/13 0630  WBC 5.3 2.3* 3.4*  HGB 11.8* 11.0* 10.8*  HCT 35.1* 32.1* 31.9*  PLT 201 183 199   BMET  Recent Labs  07/25/13 0650 07/26/13 0555 07/27/13 0630  NA 133* 138 143  K 4.0 4.0 4.3  CL 97 106 110  CO2 _0 GLUCOSE 103* 81 82  BUN _1 CREATININE 0.68 0.66 0.62  CALCIUM 8.9 8.2* 8.0*   LFT  Recent Labs  07/27/13 0630  PROT 6.6  ALBUMIN 3.1*  AST 81*  ALT 114*  ALKPHOS 176*  BILITOT <0.2*   PT/INR  Recent Labs  07/25/13 1936  LABPROT 12.7  INR 0.97   Hepatitis Panel No results found for this basename: HEPBSAG, HCVAB, HEPAIGM, HEPBIGM,  in the last 72 hours  Studies/Results: Ct Abdomen Pelvis W Contrast  07/25/2013   CLINICAL DATA:  Persistent right-sided abdominal pain.  EXAM: CT ABDOMEN AND PELVIS WITH CONTRAST  TECHNIQUE: Multidetector CT imaging of the abdomen and pelvis was performed using the standard protocol following bolus administration of intravenous contrast.  CONTRAST:  76m OMNIPAQUE IOHEXOL 300 MG/ML  SOLN  COMPARISON:  CT  abdomen and pelvis without contrast 09/03/2008. CT abdomen and pelvis with contrast 11/13/2007.  FINDINGS: Patchy ground-glass attenuation is present at the lung bases bilaterally, right greater than left. The heart size is normal. No significant pleural or pericardial effusion is present.  Intra and extra hepatic biliary dilation has increased. There is a low-density lesion in the right lobe of the liver on image 23 of series 2 which has increased in size, now measuring 17 mm. This represents either a cyst or likely a biloma. The extra hepatic biliary duct measures up to 15 mm. No obstructing lesion is evident.  The spleen is unremarkable. The stomach and duodenum are within normal limits. The pancreas is otherwise unremarkable. No mass lesion is evident. There is no significant dilation of the pancreatic duct. An 11 mm cyst on the right at 12 mm cyst in the left kidney are stable. The right ureter is mildly dilated to the point where crosses the right iliac artery, likely physiologic.  The rectosigmoid colon is within normal limits. The remainder of the colon is unremarkable. The appendix is visualized and normal. The small bowel is within normal limits. The patient is status post hysterectomy. A low-density lesion associated with the left ovary measures 2.7 cm, likely a benign cyst. The right ovary is unremarkable. No significant adenopathy or free fluid is present.  The bone windows demonstrate no focal lytic or  blastic lesions.  IMPRESSION: 1. New by about lateral lower lobe airspace disease. This appears to be more prominent than typical atelectasis and is concerning for early infection or potentially even aspiration. 2. Slight increase in intra and extra hepatic biliary dilation. No obstructing lesion is evident. MRCP could be used for further evaluation if clinically indicated. 3. Status post hysterectomy. A cystic lesion in the left ovary appears benign.   Electronically Signed   By: Lawrence Santiago M.D.    On: 07/25/2013 10:25   Mr 3d Recon At Scanner  07/26/2013   CLINICAL DATA:  Abdominal pain. Chronic hepatitis-C. Biliary ductal dilatation seen on CT. Prior cholecystectomy.  EXAM: MRI ABDOMEN WITHOUT AND WITH CONTRAST (INCLUDING MRCP)  TECHNIQUE: Multiplanar multisequence MR imaging of the abdomen was performed both before and after the administration of intravenous contrast. Heavily T2-weighted images of the biliary and pancreatic ducts were obtained, and three-dimensional MRCP images were rendered by post processing.  CONTRAST:  47mL MULTIHANCE GADOBENATE DIMEGLUMINE 529 MG/ML IV SOLN  COMPARISON:  CT on 07/25/2013  FINDINGS: Bilateral lower lobe pulmonary infiltrates again demonstrated.  Mild diffuse intra and extrahepatic biliary ductal dilatation is seen, with the common bile duct measuring 11 mm. No evidence of choledocholithiasis. No evidence of common bile duct stricture. Patient has undergone previous cholecystectomy. No evidence of pancreatic ductal dilatation.  Several tiny hepatic cysts are noted, but no liver masses are identified. No evidence of pancreatic mass or peripancreatic inflammatory changes. The spleen and adrenal glands are normal in appearance. Small proteinaceous or hemorrhagic renal cysts are seen in the upper poles of both kidneys (these Bosniak category 2), however there is no evidence of renal neoplasm or hydronephrosis.  No other abdominal soft tissue masses or lymphadenopathy identified. No evidence of inflammatory process or abnormal fluid collections.  IMPRESSION: Mild diffuse biliary dilatation with common bile duct measuring 11 mm. No evidence of choledocholithiasis or other obstructing etiology by MRI. This may be related to prior cholecystectomy.  Small benign hepatic and renal cysts.  No evidence of neoplasm.  Bilateral lower lobe infiltrates again noted.   Electronically Signed   By: Earle Gell M.D.   On: 07/26/2013 11:48   Mr Abd W/wo Cm/mrcp  07/26/2013   CLINICAL  DATA:  Abdominal pain. Chronic hepatitis-C. Biliary ductal dilatation seen on CT. Prior cholecystectomy.  EXAM: MRI ABDOMEN WITHOUT AND WITH CONTRAST (INCLUDING MRCP)  TECHNIQUE: Multiplanar multisequence MR imaging of the abdomen was performed both before and after the administration of intravenous contrast. Heavily T2-weighted images of the biliary and pancreatic ducts were obtained, and three-dimensional MRCP images were rendered by post processing.  CONTRAST:  68mL MULTIHANCE GADOBENATE DIMEGLUMINE 529 MG/ML IV SOLN  COMPARISON:  CT on 07/25/2013  FINDINGS: Bilateral lower lobe pulmonary infiltrates again demonstrated.  Mild diffuse intra and extrahepatic biliary ductal dilatation is seen, with the common bile duct measuring 11 mm. No evidence of choledocholithiasis. No evidence of common bile duct stricture. Patient has undergone previous cholecystectomy. No evidence of pancreatic ductal dilatation.  Several tiny hepatic cysts are noted, but no liver masses are identified. No evidence of pancreatic mass or peripancreatic inflammatory changes. The spleen and adrenal glands are normal in appearance. Small proteinaceous or hemorrhagic renal cysts are seen in the upper poles of both kidneys (these Bosniak category 2), however there is no evidence of renal neoplasm or hydronephrosis.  No other abdominal soft tissue masses or lymphadenopathy identified. No evidence of inflammatory process or abnormal fluid collections.  IMPRESSION: Mild diffuse  biliary dilatation with common bile duct measuring 11 mm. No evidence of choledocholithiasis or other obstructing etiology by MRI. This may be related to prior cholecystectomy.  Small benign hepatic and renal cysts.  No evidence of neoplasm.  Bilateral lower lobe infiltrates again noted.   Electronically Signed   By: Earle Gell M.D.   On: 07/26/2013 11:48     Assessment & Plan   * Newly elevated LFTs in pt with Hepatitis C. MRCP with mild, diffuse ductal dilatation.  Hepatic and renal cysts. Nothing worrisome.  Zithromax since 07/21/13 - 07/25/14, now discontinued. The LFT changes are likely a drug induced or a reacitve hepatitis. Transaminases and alk phos improved. * Sinus infection, started Zithromax 05/21/14. CXR 07/23/13 unremarkable.  Interval development of bil pneumonia by 07/25/14 CT scan. Zithromax stopped, Levaquin initiated 05/25/14.  * Hx nephrolithiasis. Small amount of RBCs on UA. Renal cysts, no stones and right ureteral dilatation on CTscan.  * Hyponatremia, resolved.  * Chronic abdominal pain, bowel habit variation suspected IBS  PLAN    * Follow LFTs  * Check Hep C genotype and viral load  * Acute Hepatitis panel pending * Avoid Zithromax  Principal Problem:   CAP (community acquired pneumonia) Active Problems:   Hepatitis C   Chronic bronchitis   Renal disorder   Abdominal pain   Nonspecific (abnormal) findings on radiological and other examination of biliary tract   Nonspecific elevation of levels of transaminase or lactic acid dehydrogenase (LDH)   Sinusitis   Transaminitis   Hematuria    LOS: 2 days   Jayant Kriz T. Fuller Plan MD [facility] 07/27/2013, 9:50 AM

## 2013-07-27 NOTE — Progress Notes (Signed)
SATURATION QUALIFICATIONS: (This note is used to comply with regulatory documentation for home oxygen)  Patient Saturations on Room Air at Rest = 95%  Patient Saturations on Room Air while Ambulating = 88%  Patient Saturations on na Liters of oxygen while Ambulating = na%  Please briefly explain why patient needs home oxygen: patient could not tolerate ambulating any further to get walking sat with O2

## 2013-07-27 NOTE — Progress Notes (Signed)
SATURATION QUALIFICATIONS: (This note is used to comply with regulatory documentation for home oxygen)  Patient Saturations on Room Air at Rest = 96%  Patient Saturations on Room Air while Ambulating = 87%  Patient Saturations on 6 Liters of oxygen while Ambulating = 92%  Please briefly explain why patient needs home oxygen: patient able to achieve O2 sat of 92% walking with 6LPM of O2.   Madelin RearLonnie Reigna Ruperto, MSN, RN, [employer]

## 2013-07-27 NOTE — Progress Notes (Addendum)
Subjective: Pt feeling better today.  She was coughing but tessalon is helping.  She denies vomiting but has a little nausea.  She is still having sinus related pain.  She has abdominal pain RUQ, RLQ x 2 weeks with is 7/10 sharp.  Recent GC/G negative, wet prep wnl.  Appendix is wnl on CT.  She does have renal and liver cysts on imaging. MRCP negative for acute findings.  Pt denies sore throat states throat is dry   Objective: Vital signs in last 24 hours: Filed Vitals:   07/26/13 0520 07/26/13 2035 07/26/13 2038 07/27/13 0521  BP: 106/71 112/78  103/74  Pulse: 82 88 82 79  Temp: 97.5 F (36.4 C) 98.6 F (37 C)  97.8 F (36.6 C)  TempSrc: Oral Oral  Oral  Resp: 18 18 18 18   Height:      Weight:      SpO2: 90% 95%  93%   Weight change:   Intake/Output Summary (Last 24 hours) at 07/27/13 1038 Last data filed at 07/27/13 0500  Gross per 24 hour  Intake   4880 ml  Output      0 ml  Net   4880 ml   Vitals reviewed. General: resting in bed, NAD HEENT: [place]/at, no scleral icterus Cardiac: RRR, no rubs, murmurs or gallops Pulm: clear to auscultation bilaterally, no wheezes, rales, or rhonchi Abd: soft, mildly tender RUQ and RLQ, nondistended, BS present Ext: warm and well perfused, no pedal edema Neuro: alert and oriented X3, cranial nerves II-XII grossly intact  Lab Results: Basic Metabolic Panel:  Recent Labs Lab 07/26/13 0555 07/27/13 0630  NA 138 143  K 4.0 4.3  CL 106 110  CO2 19 21  GLUCOSE 81 82  BUN 6 10  CREATININE 0.66 0.62  CALCIUM 8.2* 8.0*   Liver Function Tests:  Recent Labs Lab 07/26/13 0555 07/27/13 0630  AST 193* 81*  ALT 172* 114*  ALKPHOS 222* 176*  BILITOT 0.4 <0.2*  PROT 6.5 6.6  ALBUMIN 3.2* 3.1*    Recent Labs Lab 07/25/13 0650  LIPASE 20   CBC:  Recent Labs Lab 07/25/13 0650 07/26/13 0555 07/27/13 0630  WBC 5.3 2.3* 3.4*  NEUTROABS 4.0  --  0.9*  HGB 11.8* 11.0* 10.8*  HCT 35.1* 32.1* 31.9*  MCV 92.1 90.7 91.1  PLT  201 183 199   Coagulation:  Recent Labs Lab 07/25/13 1936  LABPROT 12.7  INR 0.97   Urine Drug Screen: Drugs of Abuse     Component Value Date/Time   LABOPIA NONE DETECTED 07/25/2013 2038   COCAINSCRNUR NONE DETECTED 07/25/2013 2038   LABBENZ NONE DETECTED 07/25/2013 2038   AMPHETMU NONE DETECTED 07/25/2013 2038   THCU NONE DETECTED 07/25/2013 2038   LABBARB NONE DETECTED 07/25/2013 2038    Urinalysis:  Recent Labs Lab 07/25/13 0811 07/26/13 1413  COLORURINE YELLOW YELLOW  LABSPEC 1.015 1.015  PHURINE 7.5 6.0  GLUCOSEU NEGATIVE NEGATIVE  HGBUR SMALL* TRACE*  BILIRUBINUR NEGATIVE NEGATIVE  KETONESUR NEGATIVE NEGATIVE  PROTEINUR NEGATIVE NEGATIVE  UROBILINOGEN 0.2 0.2  NITRITE NEGATIVE NEGATIVE  LEUKOCYTESUR NEGATIVE NEGATIVE   Misc. Labs: None   Micro Results: Recent Results (from the past 240 hour(s))  CULTURE, EXPECTORATED SPUTUM-ASSESSMENT     Status: None   Collection Time    07/26/13  8:18 PM      Result Value Range Status   Specimen Description SPUTUM   Final   Special Requests NONE   Final   Sputum evaluation  Final   Value: THIS SPECIMEN IS ACCEPTABLE. RESPIRATORY CULTURE REPORT TO FOLLOW.   Report Status 07/26/2013 FINAL   Final  CULTURE, RESPIRATORY (NON-EXPECTORATED)     Status: None   Collection Time    07/26/13  8:18 PM      Result Value Range Status   Specimen Description SPUTUM   Final   Special Requests NONE   Final   Gram Stain     Final   Value: ABUNDANT WBC PRESENT,BOTH PMN AND MONONUCLEAR     NO SQUAMOUS EPITHELIAL CELLS SEEN     NO ORGANISMS SEEN     Performed at [facility]   Culture PENDING   Incomplete   Report Status PENDING   Incomplete   Studies/Results: Mr 3d Recon At Scanner  07/26/2013   CLINICAL DATA:  Abdominal pain. Chronic hepatitis-C. Biliary ductal dilatation seen on CT. Prior cholecystectomy.  EXAM: MRI ABDOMEN WITHOUT AND WITH CONTRAST (INCLUDING MRCP)  TECHNIQUE: Multiplanar multisequence MR imaging of  the abdomen was performed both before and after the administration of intravenous contrast. Heavily T2-weighted images of the biliary and pancreatic ducts were obtained, and three-dimensional MRCP images were rendered by post processing.  CONTRAST:  17mL MULTIHANCE GADOBENATE DIMEGLUMINE 529 MG/ML IV SOLN  COMPARISON:  CT on 07/25/2013  FINDINGS: Bilateral lower lobe pulmonary infiltrates again demonstrated.  Mild diffuse intra and extrahepatic biliary ductal dilatation is seen, with the common bile duct measuring 11 mm. No evidence of choledocholithiasis. No evidence of common bile duct stricture. Patient has undergone previous cholecystectomy. No evidence of pancreatic ductal dilatation.  Several tiny hepatic cysts are noted, but no liver masses are identified. No evidence of pancreatic mass or peripancreatic inflammatory changes. The spleen and adrenal glands are normal in appearance. Small proteinaceous or hemorrhagic renal cysts are seen in the upper poles of both kidneys (these Bosniak category 2), however there is no evidence of renal neoplasm or hydronephrosis.  No other abdominal soft tissue masses or lymphadenopathy identified. No evidence of inflammatory process or abnormal fluid collections.  IMPRESSION: Mild diffuse biliary dilatation with common bile duct measuring 11 mm. No evidence of choledocholithiasis or other obstructing etiology by MRI. This may be related to prior cholecystectomy.  Small benign hepatic and renal cysts.  No evidence of neoplasm.  Bilateral lower lobe infiltrates again noted.   Electronically Signed   By: Myles Rosenthal M.D.   On: 07/26/2013 11:48   Mr Abd W/wo Cm/mrcp  07/26/2013   CLINICAL DATA:  Abdominal pain. Chronic hepatitis-C. Biliary ductal dilatation seen on CT. Prior cholecystectomy.  EXAM: MRI ABDOMEN WITHOUT AND WITH CONTRAST (INCLUDING MRCP)  TECHNIQUE: Multiplanar multisequence MR imaging of the abdomen was performed both before and after the administration of  intravenous contrast. Heavily T2-weighted images of the biliary and pancreatic ducts were obtained, and three-dimensional MRCP images were rendered by post processing.  CONTRAST:  17mL MULTIHANCE GADOBENATE DIMEGLUMINE 529 MG/ML IV SOLN  COMPARISON:  CT on 07/25/2013  FINDINGS: Bilateral lower lobe pulmonary infiltrates again demonstrated.  Mild diffuse intra and extrahepatic biliary ductal dilatation is seen, with the common bile duct measuring 11 mm. No evidence of choledocholithiasis. No evidence of common bile duct stricture. Patient has undergone previous cholecystectomy. No evidence of pancreatic ductal dilatation.  Several tiny hepatic cysts are noted, but no liver masses are identified. No evidence of pancreatic mass or peripancreatic inflammatory changes. The spleen and adrenal glands are normal in appearance. Small proteinaceous or hemorrhagic renal cysts are seen in the upper  poles of both kidneys (these Bosniak category 2), however there is no evidence of renal neoplasm or hydronephrosis.  No other abdominal soft tissue masses or lymphadenopathy identified. No evidence of inflammatory process or abnormal fluid collections.  IMPRESSION: Mild diffuse biliary dilatation with common bile duct measuring 11 mm. No evidence of choledocholithiasis or other obstructing etiology by MRI. This may be related to prior cholecystectomy.  Small benign hepatic and renal cysts.  No evidence of neoplasm.  Bilateral lower lobe infiltrates again noted.   Electronically Signed   By: Myles RosenthalJohn  Stahl M.D.   On: 07/26/2013 11:48   Medications:  Scheduled Meds: . amitriptyline  50 mg Oral QHS  . antiseptic oral rinse  15 mL Mouth Rinse q12n4p  . chlorhexidine  15 mL Mouth Rinse BID  . citalopram  40 mg Oral Daily  . dicyclomine  20 mg Oral BID  . doxycycline  100 mg Oral Q12H  . heparin  5,000 Units Subcutaneous Q8H  . loratadine  10 mg Oral Daily  . oxymetazoline  2 spray Each Nare BID   Continuous Infusions: . sodium  chloride 125 mL/hr at 07/27/13 0330   PRN Meds:.albuterol, benzonatate, chlorpheniramine-HYDROcodone, diphenhydrAMINE, diphenhydrAMINE, guaiFENesin-dextromethorphan, ibuprofen, ondansetron (ZOFRAN) IV, sodium chloride Assessment/Plan: 43 y.o with PMH chronic bronchitis/COPD, HCV, cholecystectomy, cystic kidneys, h/o kidney stones, depression/anxiety, GERD.  She presents for fever,chills, cough, nausea/vomiting, RUQ abdominal pain found to have infiltrates on imaging and transaminitis.    #CAP (community acquired pneumonia) -HIV negative. Legionella/Strep pneumonia negative, pending blood cultures and respiratory cultures   -Levaquin changed to Doxycycline due to allergic Rx itching.  Given prn Benadryl  -will tx with Abx total duration of 7 days -she ambulated with pulse ox today and desat to 87%and needed 6L O2 to return to 92 % -Albuterol neb q6 hours scheduled, NS 125 cc/hr will d/c today, Robitussin DM/Tessalon for cough, prn Zofran for postussive vomiting  -PT eval and tx tomorrow  #sinusitis  -Changed Levaquin to Doxycycline due to allergic Rx itching.  Given prn Benadryl   -will try Afrin x 3 days and Ocean   #Hepatitis C -will need to f/u and establish outpt with PCP and possibly GI/ID referral  -rec outpatient Hep panel and quant.  -Will need Hep A/B vac if not previously vaccinated.    #RUQ, RLQ Abdominal pain -s/p gallbladder removal; Pt states pain present x 2 weeks.  STD screening neg 2 weeks ago, appendix is normal on CT, MRCP shows chronic changes due to gallbladder removal.   H/o ?IBS.  She does have renal and liver cysts and left ovarian cyst.   -Avoid liver toxic medications  -Continued Bentyl for IBS -prn Advil if needed for pain  -She will likely need PCP and/or GI f/u in the future.    #Transaminitis -AP, ALT, AST trending down  -Etiology may due to HCV, recent AZM (Monday-Thurs)/Tylenol use.  -Avoid AZM use/Tylenol in the future  -repeat CMET in the future    #Hematuria, chronic -repeat UA with trace hemoglobin -will likely need to f/u with urology outpatient with h/o chronic RBC urine and kidney stones   #F/E/N -NSL -cardiac diet   #DVT px  -Hep, scds   [facility]   The patient does not have a current PCP (No Pcp Per Patient) and [facility] after [facility] with list of resources given.  The patient does have transportation limitations that hinder transportation to clinic appointments.  .Marland Kitchen  Services Needed at time of discharge: Y = Yes, Blank = No PT:   OT:   RN:   Equipment:   Other:     LOS: 2 days   Annett Gula, MD [phone] 07/27/2013, 10:38 AM

## 2013-07-27 NOTE — Progress Notes (Signed)
PT Cancellation Note  Patient Details Name: Karen Baldwin MRN: [id number] DOB: 12-25-1970   Cancelled Treatment:      MD order to start PT 1/18.    Donnamarie PoagWest, Latiya Navia [place], [place] [phone] 07/27/2013, 4:32 PM

## 2013-07-28 DIAGNOSIS — Z720 Tobacco use: Secondary | ICD-10-CM | POA: Diagnosis present

## 2013-07-28 LAB — COMPREHENSIVE METABOLIC PANEL
ALT: 88 U/L — ABNORMAL HIGH (ref 0–35)
AST: 41 U/L — ABNORMAL HIGH (ref 0–37)
Albumin: 3.4 g/dL — ABNORMAL LOW (ref 3.5–5.2)
Alkaline Phosphatase: 167 U/L — ABNORMAL HIGH (ref 39–117)
BUN: 15 mg/dL (ref 6–23)
CALCIUM: 9 mg/dL (ref 8.4–10.5)
CHLORIDE: 101 meq/L (ref 96–112)
CO2: 22 meq/L (ref 19–32)
CREATININE: 0.63 mg/dL (ref 0.50–1.10)
GFR calc non Af Amer: >90 mL/min (ref >90)
GLUCOSE: 88 mg/dL (ref 70–99)
Potassium: 4 mEq/L (ref 3.7–5.3)
Sodium: 137 mEq/L (ref 137–147)
Total Bilirubin: <0.2 mg/dL — ABNORMAL LOW (ref 0.3–1.2)
Total Protein: 7.2 g/dL (ref 6.0–8.3)

## 2013-07-28 LAB — CBC WITH DIFFERENTIAL/PLATELET
BASOS PCT: 1 % (ref 0–1)
Basophils Absolute: 0 10*3/uL (ref 0.0–0.1)
EOS ABS: 0.3 10*3/uL (ref 0.0–0.7)
Eosinophils Relative: 6 % — ABNORMAL HIGH (ref 0–5)
HCT: 34.9 % — ABNORMAL LOW (ref 36.0–46.0)
HEMOGLOBIN: 11.8 g/dL — AB (ref 12.0–15.0)
LYMPHS ABS: 2.2 10*3/uL (ref 0.7–4.0)
Lymphocytes Relative: 51 % — ABNORMAL HIGH (ref 12–46)
MCH: 30.8 pg (ref 26.0–34.0)
MCHC: 33.8 g/dL (ref 30.0–36.0)
MCV: 91.1 fL (ref 78.0–100.0)
MONOS PCT: 9 % (ref 3–12)
Monocytes Absolute: 0.4 10*3/uL (ref 0.1–1.0)
NEUTROS PCT: 32 % — AB (ref 43–77)
Neutro Abs: 1.4 10*3/uL — ABNORMAL LOW (ref 1.7–7.7)
PLATELETS: 222 10*3/uL (ref 150–400)
RBC: 3.83 MIL/uL — AB (ref 3.87–5.11)
RDW: 13.1 % (ref 11.5–15.5)
WBC: 4.2 10*3/uL (ref 4.0–10.5)

## 2013-07-28 MED ORDER — ALBUTEROL SULFATE HFA 108 (90 BASE) MCG/ACT IN AERS
1.0000 | INHALATION_SPRAY | Freq: Four times a day (QID) | RESPIRATORY_TRACT | Status: DC | PRN
  Filled 2013-07-28: qty 6.7

## 2013-07-28 MED ORDER — SENNOSIDES-DOCUSATE SODIUM 8.6-50 MG PO TABS
1.0000 | ORAL_TABLET | Freq: Two times a day (BID) | ORAL | Status: DC | PRN
  Administered 2013-07-28: 1 via ORAL
  Filled 2013-07-28: qty 1

## 2013-07-28 MED ORDER — SALINE SPRAY 0.65 % NA SOLN: 1.0000 | NASAL | Status: DC | PRN

## 2013-07-28 MED ORDER — IBUPROFEN 400 MG PO TABS: 400.0000 mg | ORAL_TABLET | Freq: Three times a day (TID) | ORAL | Status: DC | PRN

## 2013-07-28 MED ORDER — ALBUTEROL SULFATE (2.5 MG/3ML) 0.083% IN NEBU: 2.5000 mg | INHALATION_SOLUTION | RESPIRATORY_TRACT | Status: DC | PRN

## 2013-07-28 MED ORDER — DOXYCYCLINE HYCLATE 100 MG PO TABS: 100.0000 mg | ORAL_TABLET | Freq: Two times a day (BID) | ORAL | Status: DC

## 2013-07-28 MED ORDER — IPRATROPIUM BROMIDE 0.02 % IN SOLN
0.5000 mg | RESPIRATORY_TRACT | Status: DC
  Administered 2013-07-28 (×2): 0.5 mg via RESPIRATORY_TRACT
  Filled 2013-07-28 (×2): qty 2.5

## 2013-07-28 MED ORDER — ALBUTEROL SULFATE (2.5 MG/3ML) 0.083% IN NEBU
2.5000 mg | INHALATION_SOLUTION | Freq: Three times a day (TID) | RESPIRATORY_TRACT | Status: DC
  Administered 2013-07-29: 2.5 mg via RESPIRATORY_TRACT
  Filled 2013-07-28: qty 3

## 2013-07-28 MED ORDER — ALBUTEROL SULFATE (2.5 MG/3ML) 0.083% IN NEBU
2.5000 mg | INHALATION_SOLUTION | RESPIRATORY_TRACT | Status: DC
  Administered 2013-07-28 (×2): 2.5 mg via RESPIRATORY_TRACT
  Filled 2013-07-28 (×2): qty 3

## 2013-07-28 MED ORDER — POLYETHYLENE GLYCOL 3350 17 G PO PACK
17.0000 g | PACK | Freq: Two times a day (BID) | ORAL | Status: DC | PRN
  Filled 2013-07-28: qty 1

## 2013-07-28 MED ORDER — OXYMETAZOLINE HCL 0.05 % NA SOLN: 2.0000 | Freq: Two times a day (BID) | NASAL | Status: AC

## 2013-07-28 MED ORDER — IPRATROPIUM BROMIDE 0.02 % IN SOLN
0.5000 mg | Freq: Three times a day (TID) | RESPIRATORY_TRACT | Status: DC
  Administered 2013-07-29: 0.5 mg via RESPIRATORY_TRACT
  Filled 2013-07-28: qty 2.5

## 2013-07-28 NOTE — Progress Notes (Addendum)
Subjective: Pt feels better today breathing better.  Overall feels weak, tired.  Afrin and Ocean spray helped her nasal congestion.    RN to walk again today with pulse ox  Objective: Vital signs in last 24 hours: Filed Vitals:   07/27/13 1401 07/27/13 2140 07/28/13 0542 07/28/13 0925  BP: 109/75 117/79 109/75   Pulse: 86 88 69   Temp: 98.9 F (37.2 C) 98.3 F (36.8 C) 97.9 F (36.6 C)   TempSrc: Oral Oral Oral   Resp: 18 17 17    Height:      Weight:      SpO2: 96% 94% 93% 93%   Weight change:   Intake/Output Summary (Last 24 hours) at 07/28/13 1048 Last data filed at 07/28/13 0901  Gross per 24 hour  Intake   1440 ml  Output      0 ml  Net   1440 ml   Vitals reviewed. General: resting in bed, NAD HEENT: [place]/at, no scleral icterus Cardiac: RRR, no rubs, murmurs or gallops Pulm: clear to auscultation bilaterally, no wheezes, rales, or rhonchi Abd: soft, nt/nondistended, BS present Ext: warm and well perfused, no pedal edema Neuro: alert and oriented X3, cranial nerves II-XII grossly intact  Lab Results: Basic Metabolic Panel:  Recent Labs Lab 07/27/13 0630 07/28/13 0627  NA 143 137  K 4.3 4.0  CL 110 101  CO2 21 22  GLUCOSE 82 88  BUN 10 15  CREATININE 0.62 0.63  CALCIUM 8.0* 9.0   Liver Function Tests:  Recent Labs Lab 07/27/13 0630 07/28/13 0627  AST 81* 41*  ALT 114* 88*  ALKPHOS 176* 167*  BILITOT <0.2* <0.2*  PROT 6.6 7.2  ALBUMIN 3.1* 3.4*    Recent Labs Lab 07/25/13 0650  LIPASE 20   CBC:  Recent Labs Lab 07/27/13 0630 07/28/13 0627  WBC 3.4* 4.2  NEUTROABS 0.9* 1.4*  HGB 10.8* 11.8*  HCT 31.9* 34.9*  MCV 91.1 91.1  PLT 199 222   Coagulation:  Recent Labs Lab 07/25/13 1936  LABPROT 12.7  INR 0.97   Urine Drug Screen: Drugs of Abuse     Component Value Date/Time   LABOPIA NONE DETECTED 07/25/2013 2038   COCAINSCRNUR NONE DETECTED 07/25/2013 2038   LABBENZ NONE DETECTED 07/25/2013 2038   AMPHETMU NONE DETECTED  07/25/2013 2038   THCU NONE DETECTED 07/25/2013 2038   LABBARB NONE DETECTED 07/25/2013 2038    Urinalysis:  Recent Labs Lab 07/25/13 0811 07/26/13 1413  COLORURINE YELLOW YELLOW  LABSPEC 1.015 1.015  PHURINE 7.5 6.0  GLUCOSEU NEGATIVE NEGATIVE  HGBUR SMALL* TRACE*  BILIRUBINUR NEGATIVE NEGATIVE  KETONESUR NEGATIVE NEGATIVE  PROTEINUR NEGATIVE NEGATIVE  UROBILINOGEN 0.2 0.2  NITRITE NEGATIVE NEGATIVE  LEUKOCYTESUR NEGATIVE NEGATIVE   Misc. Labs: None   Micro Results: Recent Results (from the past 240 hour(s))  CULTURE, BLOOD (ROUTINE X 2)     Status: None   Collection Time    07/25/13  7:20 PM      Result Value Range Status   Specimen Description BLOOD RIGHT HAND   Final   Special Requests BOTTLES DRAWN AEROBIC ONLY 2CC   Final   Culture  Setup Time     Final   Value: 07/26/2013 02:23     Performed at [facility]   Culture     Final   Value:        BLOOD CULTURE RECEIVED NO GROWTH TO DATE CULTURE WILL BE HELD FOR 5 DAYS BEFORE ISSUING A FINAL NEGATIVE  REPORT     Performed at [facility]   Report Status PENDING   Incomplete  CULTURE, BLOOD (ROUTINE X 2)     Status: None   Collection Time    07/25/13  7:33 PM      Result Value Range Status   Specimen Description BLOOD LEFT HAND   Final   Special Requests BOTTLES DRAWN AEROBIC ONLY 3CC   Final   Culture  Setup Time     Final   Value: 07/26/2013 02:24     Performed at [facility]   Culture     Final   Value:        BLOOD CULTURE RECEIVED NO GROWTH TO DATE CULTURE WILL BE HELD FOR 5 DAYS BEFORE ISSUING A FINAL NEGATIVE REPORT     Performed at [facility]   Report Status PENDING   Incomplete  CULTURE, EXPECTORATED SPUTUM-ASSESSMENT     Status: None   Collection Time    07/26/13  8:18 PM      Result Value Range Status   Specimen Description SPUTUM   Final   Special Requests NONE   Final   Sputum evaluation     Final   Value: THIS SPECIMEN IS ACCEPTABLE. RESPIRATORY CULTURE REPORT  TO FOLLOW.   Report Status 07/26/2013 FINAL   Final  CULTURE, RESPIRATORY (NON-EXPECTORATED)     Status: None   Collection Time    07/26/13  8:18 PM      Result Value Range Status   Specimen Description SPUTUM   Final   Special Requests NONE   Final   Gram Stain     Final   Value: ABUNDANT WBC PRESENT,BOTH PMN AND MONONUCLEAR     NO SQUAMOUS EPITHELIAL CELLS SEEN     NO ORGANISMS SEEN     Performed at [facility]   Culture     Final   Value: Culture reincubated for better growth     Performed at [facility]   Report Status PENDING   Incomplete   Studies/Results: No results found. Medications:  Scheduled Meds: . albuterol  2.5 mg Nebulization Q6H  . amitriptyline  50 mg Oral QHS  . citalopram  40 mg Oral Daily  . dicyclomine  20 mg Oral BID  . doxycycline  100 mg Oral Q12H  . heparin  5,000 Units Subcutaneous Q8H  . loratadine  10 mg Oral Daily  . oxymetazoline  2 spray Each Nare BID   Continuous Infusions:   PRN Meds:.albuterol, benzonatate, diphenhydrAMINE, diphenhydrAMINE, guaiFENesin-dextromethorphan, ibuprofen, ondansetron (ZOFRAN) IV, ondansetron, sodium chloride Assessment/Plan: 43 y.o with PMH chronic bronchitis/COPD, HCV, cholecystectomy, cystic kidneys, h/o kidney stones, depression/anxiety, GERD.  She presents for fever,chills, cough, nausea/vomiting, RUQ abdominal pain found to have infiltrates on imaging and transaminitis.    #CAP (community acquired pneumonia) -HIV negative. Legionella/Strep pneumonia negative, pending blood cultures and respiratory cultures   -Levaquin changed to Doxycycline due to allergic Rx itching.  Given prn Benadryl  -will tx with Abx total duration of 7 days -she ambulated with pulse ox 1/17 and desat to 87%and needed 6L O2 to return to 92 %.  Ambulated again today with PT and RN she desat to 87% and does not qualify for O2 due to no insurance. She needs 2 L with exertion.   -Albuterol neb q4 hours scheduled, added  Atrovent q4, Albuterol inhaler q6 prn, Robitussin DM/Tessalon for cough, prn Zofran for postussive vomiting  -will d/c likely tomorrow   #sinusitis  -  Changed Levaquin to Doxycycline due to allergic Rx itching.  Given prn Benadryl   -will try Afrin x 3 days and Ocean   #Hepatitis C -will need to f/u and establish outpt with PCP and possibly GI/ID referral  -rec outpatient Hep panel and quant.  -Will need Hep A/B vac if not previously vaccinated.    #RUQ, RLQ Abdominal pain -s/p gallbladder removal; Pt states pain present x 2 weeks.  STD screening neg 2 weeks ago, appendix is normal on CT, MRCP shows chronic changes due to gallbladder removal.   H/o ?IBS.  She does have renal and liver cysts and left ovarian cyst.   -Avoid liver toxic medications  -Continued Bentyl for IBS -prn Advil if needed for pain  -She will likely need PCP and/or GI f/u in the future.    #Transaminitis -AP, ALT, AST trending down  -Etiology may due to HCV, recent AZM (Monday-Thurs)/Tylenol use most likely.  -Avoid AZM use/Tylenol and liver toxic drugs in the future  -repeat CMET in the future   #Hematuria, chronic -repeat UA with trace hemoglobin -will likely need to f/u with urology outpatient with h/o chronic RBC urine and kidney stones   #chronic bronchitis  -per pt history.  -if not had already will likely need outpatient pfts.  She uses albuterol inhaler at home and still smokes  -counseled on smoking cessation -Duoneb q4 hours   #F/E/N -NSL -cardiac diet   #DVT px  -Hep, scds   [facility]   The patient does not have a current PCP (No Pcp Per Patient) and [facility] discharge with list of resources given.  The patient does have transportation limitations that hinder transportation to clinic appointments.  .Services Needed at time of discharge: Y = Yes, Blank = No PT: Note pending   OT:   RN:     Equipment: Possibly home O2 but cant get O2 d/t no insurance  Other:     LOS: 3 days   Annett Gularacy N McLean, MD [phone] 07/28/2013, 10:48 AM

## 2013-07-28 NOTE — Discharge Instructions (Addendum)
Please see list of resources and call and make an appointment with a regular doctor Monday. You may want to try the [facility].  You will need a repeat Chest Xray in 6-8 weeks to make sure the pneumonia is resolved.  Try to quit smoking.  Take Doxycycline 2x per day for 3 more days.  You will need outpatient pulmonary function tests and repeat of your comprehensive metabolic panel (CMET).  You will need a regular doctor to refer you to a urologist (blood in your urine with history of kidney stones) and Infectious Disease or Gastroenterology for your hepatitis.    Emergency Department Resource Guide 1) Find a Doctor and Pay Out of Pocket Although you won't have to find out who is covered by your insurance plan, it is a good idea to ask around and get recommendations. You will then need to call the office and see if the doctor you have chosen will accept you as a new patient and what types of options they offer for patients who are self-pay. Some doctors offer discounts or will set up payment plans for their patients who do not have insurance, but you will need to ask so you aren't surprised when you get to your appointment.  2) Contact Your Local Health Department Not all health departments have doctors that can see patients for sick visits, but many do, so it is worth a call to see if yours does. If you don't know where your local health department is, you can check in your phone book. The CDC also has a tool to help you locate your state's health department, and many state websites also have listings of all of their local health departments.  3) Find a [facility] If your illness is not likely to be very severe or complicated, you may want to try a walk in clinic. These are popping up all over the country in pharmacies, drugstores, and shopping centers. They're usually staffed by nurse practitioners or physician assistants that have been trained to treat common illnesses and  complaints. They're usually fairly quick and inexpensive. However, if you have serious medical issues or chronic medical problems, these are probably not your best option.  No Primary Care Doctor: - Call Health Connect at  [phone] - they can help you locate a primary care doctor that  accepts your insurance, provides certain services, etc. - Physician Referral Service- [phone]  Chronic Pain Problems: Organization         Address  Phone   Notes  [facility]  [phone] Patients need to be referred by their primary care doctor.   Medication Assistance: Organization         Address  Phone   Notes  [facility] Medication [facility] [place], [place] [address] [phone] --Must be a resident of [facility] -- Must have NO insurance coverage whatsoever (no Medicaid/ Medicare, etc.) -- The pt. MUST have a primary care doctor that directs their care regularly and follows them in the community   Med[facility]ist  [phone]   [employer]  [phone]    Agencies that provide inexpensive medical care: Organization         Address  Phone   Notes  [place]  [phone]   [facility]    [phone]   [facility] [place], [place] [address] [phone]  [facility] [address] [place] [phone]   Planned Parenthood    [phone]   [facility]    [phone]   [place] and [place] [address], [place] Phone:  [phone], Fax:  [phone] Hours of Operation:  9 am - 6 pm, M-F.  Also accepts Medicaid/Medicare and self-pay.  [facility] for [place] [address], [address], [place] Phone: [phone], Fax: [phone]. Hours of Operation:  8:30 am - 5:30 pm, M-F.  Also accepts Medicaid and self-pay.  [facility] [place] [address] [place] Phone: [phone]   [place], [place], [place] [phone], Ext. [phone] Mondays & Thursdays: 7-9 AM.  First 15 patients are seen on a first come, first serve basis.    [place] Providers:  Organization         Address  Phone   Notes  [facility] [address], [place] [phone] Also accepts self-pay patients.  [facility] [address], [place]  [phone]   [place], [address], [place] [phone]   [facility] [address], [place] [phone]   Lucianne Lei [address], [place]   [phone] Only accepts [place] Access [place] patients after they have their name applied to their card.   Self-Pay (no insurance) in [facility] [place]:  Organization         Address  Phone   Notes  Sickle Cell Patients, [facility] [place] [phone]   [facility] [place] [phone]   [facility] [place]  [place], [place], [place] [phone]   [facility]/Dr. Osei-Bonsu  [address] [place] or [address], [place] [phone] Phone number for both [place] and [place] locations is the same.  Urgent Medical and [facility] [address] [place] [phone]   [facility] [address] [place] or [address] [phone]   [facility] [address], [place] [phone], phone; [phone], fax Sees patients 1st and 3rd Saturday of every month.  Must not qualify for public or private insurance (i.e. Medicaid, Medicare, [place] Health Choice, Veterans' Benefits)  Household income should be no more than 200% of the poverty level  The clinic cannot treat you if you are pregnant or think you are pregnant  Sexually transmitted diseases are not treated at the clinic.    Dental Care: Organization         Address  Phone  Notes  [facility] [place] [phone] Accepts children up to age 97 who are enrolled in [place] or [place]; pregnant women with a Medicaid card; and children who have applied for Medicaid or [place] Health Choice, but were declined, whose parents can pay a reduced fee at time of service.  [facility] of [facility]  [address], [place] [phone] Accepts children up to age 11 who are enrolled in [place] or [place]; pregnant women with a Medicaid card; and  children who have applied for Medicaid or [place] Health Choice, but were declined, whose parents can pay a reduced fee at time of service.  [place] Adult Dental Access PROGRAM  [place] [phone] Patients are seen by appointment only. Walk-ins are not accepted. [place] will see patients 65 years of age and older. Monday - Tuesday (8am-5pm) Most Wednesdays (8:30-5pm) $30 per visit, cash only  [place] Adult Dental Access PROGRAM  [address], [facility] [phone] Patients are seen by appointment only. Walk-ins are not accepted. [place] will see patients 17 years of age and older. One Wednesday Evening (Monthly: Volunteer Based).  $30 per visit, cash only  [place]  [phone] for adults; Children under age 53, call Graduate Pediatric Dentistry at [phone]. Children aged 31-14, please call [phone] to request a pediatric application.  Dental services are provided in all areas of dental care including fillings, crowns and bridges, complete and partial dentures, implants, gum treatment, root canals, and extractions. Preventive care is  also provided. Treatment is provided to both adults and children. Patients are selected via a lottery and there is often a waiting list.   [facility] [address] [place]  [phone] [email or web address]   [facility] [address] [place], [place] [phone], Ext. [phone] Second and Fourth Thursday of each month, opens at 6:30 AM; Clinic ends at 9 AM.  Patients are seen on a first-come first-served basis, and a limited number are seen during each clinic.   [facility]  [address] [place] Danker [place], [place] [phone]   Eligibility Requirements You must have lived in [place], [place], or [place] counties for at least the last three months.   You cannot be eligible for state or federal sponsored [employer], including [employer], [place], or [employer].   You generally cannot be eligible for healthcare insurance through your employer.    How to apply: Eligibility screenings are held every Tuesday and Wednesday afternoon from 1:00 pm until 4:00 pm. You do not need an appointment for the interview!  [facility] [address] [place], [place]   [place]  [place] Department  [place]  [phone]    Behavioral Health Resources in the Community: Intensive Outpatient Programs Organization         Address  Phone  Notes  [place]. [address] [place], [place] [phone]   [facility] Outpatient [address], [place], [place]   ADS: Alcohol & Drug Svcs [address] [place], [place]   [place] [address],  [place], [place] or [phone]   Substance Abuse Resources Organization         Address  Phone  Notes  Alcohol and Drug Services  [phone]   [place]  [phone]   [facility] [place]   Chinita Pester  [phone]   Residential & Outpatient Substance Abuse Program  [phone]   Psychological Services Organization         Address  Phone  Notes  [facility] [place]  [place]  [phone]   [place] [address], [place] or [phone]    Mobile Crisis Teams Organization  Address  Phone  Notes  [facility], Mobile Crisis Care Unit  [phone]   [facility]  [address] [place], [place]   [facility] [address] [place] [phone]    Self-Help/Support Groups Organization         Address  Phone             Notes  [place]. of [place] - variety of support groups  [place] Call for more information  Narcotics Anonymous (NA), [facility] [address], [employer] [place]  2 meetings at this location   Special educational needs teacher         Address  Phone  Notes  [facility] [place],    [place]  [phone]   [facility]  [address], [place] [address], [place], [place]   [place], [place] [phone] Admissions: 8am-3pm M-F  Incentives Substance [place] [address],    [place], [place] [phone]   [facility] [address] [place], [place], [place]   [facility] [address]  [place], [place]   [facility] - Intensive Outpatient [address], [place], [place]   [facility] ([place].) [place].,  [place], [place] [phone] or [phone]   [facility] Services (RTS) [address] [place], [place] Accepts Medicaid  [facility] [place] [address]  [place] [phone]  Substance Abuse/Addiction Treatment   [facility] Organization         Address  Phone  Notes  [facility]  [phone]   Domenic Schwab, PhD [address] Arlis [place], [place]   [phone] or [phone]   [place] [place], [place] [phone]   [facility] [address] [place], [place] [phone] Insurance/Medicaid/sponsorship through [facility] [address]                                    [place], [place] [phone] [place] [address], [place] [phone]    Dr. Adele Schilder  [phone]   [facility] Dept. 1) [address], [place] 2) [place] 3)  [place] [address], [place] [phone]  [phone]   [place] [phone] or [phone] (After Hours)     Abdominal Pain, Adult Many things can cause abdominal pain. Usually, abdominal pain is not caused by a disease and will improve without treatment. It can often be observed and treated at home. Your health care provider will do a physical exam and possibly order blood tests and X-rays to help determine the seriousness of your pain. However, in many cases, more time must pass before a clear cause of the pain can be found. Before that point, your health care provider may not know if you need more testing or further treatment. HOME CARE INSTRUCTIONS  Monitor your abdominal pain for any changes. The following actions may help to alleviate any discomfort you are experiencing:  Only take over-the-counter or prescription medicines as directed by your health care  provider.  Do not take laxatives unless directed to do so by your health care provider.  Try a clear liquid diet (broth, tea, or water) as directed by your health care provider.  Slowly move to a bland diet as tolerated. SEEK MEDICAL CARE IF:  You have unexplained abdominal pain.  You have abdominal pain associated with nausea or diarrhea.  You have pain when you urinate or have a bowel movement.  You experience abdominal pain that wakes you in the night.  You have abdominal pain that is worsened or improved by eating food.  You have abdominal pain that is worsened with eating fatty foods. SEEK IMMEDIATE MEDICAL CARE IF:   Your pain does not go away within 2 hours.  You have a fever.  You keep throwing up (vomiting).  Your pain is felt only in portions of the abdomen, such as the right side or the left lower portion of the abdomen.  You pass bloody or black tarry stools. MAKE SURE YOU:  Understand these instructions.   Will watch your condition.   Will get help right away if you are not doing well or get worse.  Document Released: 04/06/2005 Document Revised: 04/17/2013 Document Reviewed: 03/06/2013 [facility] Patient Information 2014 [place].  Pneumonia, Adult Pneumonia is an infection of the lungs.  CAUSES Pneumonia may be caused by bacteria or a virus. Usually, these infections are caused by breathing infectious particles into the lungs (respiratory tract). SYMPTOMS   Cough.  Fever.  Chest pain.  Increased rate of breathing.  Wheezing.  Mucus production. DIAGNOSIS  If you have the common symptoms of pneumonia, your caregiver will typically confirm the diagnosis with a chest X-ray. The X-ray will show an abnormality in the lung (pulmonary infiltrate) if you have pneumonia. Other tests of your blood, urine, or sputum may be done to find the specific cause of your pneumonia. Your caregiver may also do tests (blood gases or pulse oximetry) to see how well your lungs are working. TREATMENT  Some forms of pneumonia may be spread to other people when you cough or sneeze. You may be asked to wear a mask before and during your exam.  Pneumonia that is caused by bacteria is treated with antibiotic medicine. Pneumonia that is caused by the influenza virus may be treated with an antiviral medicine. Most other viral infections must run their course. These infections will not respond to antibiotics.  PREVENTION A pneumococcal shot (vaccine) is available to prevent a common bacterial cause of pneumonia. This is usually suggested for:  People over 86 years old.  Patients on chemotherapy.  People with chronic lung problems, such as bronchitis or emphysema.  People with immune system problems. If you are over 65 or have a high risk condition, you may receive the pneumococcal vaccine if you have not received it before. In some countries, a routine influenza vaccine is also recommended. This vaccine can help prevent some cases of pneumonia.You may be offered the influenza vaccine as part of your care. If you smoke, it is time to quit. You may receive instructions on how to stop smoking. Your caregiver can provide medicines and counseling to help you quit. HOME CARE INSTRUCTIONS   Cough suppressants may be used if you are losing too much rest. However, coughing protects you by clearing your lungs. You should avoid using cough suppressants if you can.  Your caregiver may have prescribed medicine if he or she thinks your pneumonia is caused by a bacteria or influenza. [employer]  your medicine even if you start to feel better.  Your caregiver may also prescribe an expectorant. This loosens the mucus to be coughed up.  Only take over-the-counter or prescription medicines for pain, discomfort, or fever as directed by your caregiver.  Do not smoke. Smoking is a common cause of bronchitis and can contribute to pneumonia. If you are a smoker and continue to smoke, your cough may last several weeks after your pneumonia has cleared.  A cold steam vaporizer or humidifier in your room or home may help loosen mucus.  Coughing is often worse at  night. Sleeping in a semi-upright position in a recliner or using a couple pillows under your head will help with this.  Get rest as you feel it is needed. Your body will usually let you know when you need to rest. SEEK IMMEDIATE MEDICAL CARE IF:   Your illness becomes worse. This is especially true if you are elderly or weakened from any other disease.  You cannot control your cough with suppressants and are losing sleep.  You begin coughing up blood.  You develop pain which is getting worse or is uncontrolled with medicines.  You have a fever.  Any of the symptoms which initially brought you in for treatment are getting worse rather than better.  You develop shortness of breath or chest pain. MAKE SURE YOU:   Understand these instructions.  Will watch your condition.  Will get help right away if you are not doing well or get worse. Document Released: 06/27/2005 Document Revised: 09/19/2011 Document Reviewed: 09/16/2010 [facility] Patient Information 2014 [place], [place].  Nausea and Vomiting Nausea is a sick feeling that often comes before throwing up (vomiting). Vomiting is a reflex where stomach contents come out of your mouth. Vomiting can cause severe loss of body fluids (dehydration). Children and elderly adults can become dehydrated quickly, especially if they also have diarrhea. Nausea and vomiting are symptoms of a condition or disease. It is important to find the cause of your symptoms. CAUSES   Direct irritation of the stomach lining. This irritation can result from increased acid production (gastroesophageal reflux disease), infection, food poisoning, taking certain medicines (such as nonsteroidal anti-inflammatory drugs), alcohol use, or tobacco use.  Signals from the brain.These signals could be caused by a headache, heat exposure, an inner ear disturbance, increased pressure in the brain from injury, infection, a tumor, or a concussion, pain, emotional stimulus, or  metabolic problems.  An obstruction in the gastrointestinal tract (bowel obstruction).  Illnesses such as diabetes, hepatitis, gallbladder problems, appendicitis, kidney problems, cancer, sepsis, atypical symptoms of a heart attack, or eating disorders.  Medical treatments such as chemotherapy and radiation.  Receiving medicine that makes you sleep (general anesthetic) during surgery. DIAGNOSIS Your caregiver may ask for tests to be done if the problems do not improve after a few days. Tests may also be done if symptoms are severe or if the reason for the nausea and vomiting is not clear. Tests may include:  Urine tests.  Blood tests.  Stool tests.  Cultures (to look for evidence of infection).  X-rays or other imaging studies. Test results can help your caregiver make decisions about treatment or the need for additional tests. TREATMENT You need to stay well hydrated. Drink frequently but in small amounts.You may wish to drink water, sports drinks, clear broth, or eat frozen ice pops or gelatin dessert to help stay hydrated.When you eat, eating slowly may help prevent nausea.There are also some antinausea medicines that  may help prevent nausea. HOME CARE INSTRUCTIONS   Take all medicine as directed by your caregiver.  If you do not have an appetite, do not force yourself to eat. However, you must continue to drink fluids.  If you have an appetite, eat a normal diet unless your caregiver tells you differently.  Eat a variety of complex carbohydrates (rice, wheat, potatoes, bread), lean meats, yogurt, fruits, and vegetables.  Avoid high-fat foods because they are more difficult to digest.  Drink enough water and fluids to keep your urine clear or pale yellow.  If you are dehydrated, ask your caregiver for specific rehydration instructions. Signs of dehydration may include:  Severe thirst.  Dry lips and mouth.  Dizziness.  Dark urine.  Decreasing urine frequency and  amount.  Confusion.  Rapid breathing or pulse. SEEK IMMEDIATE MEDICAL CARE IF:   You have blood or brown flecks (like coffee grounds) in your vomit.  You have black or bloody stools.  You have a severe headache or stiff neck.  You are confused.  You have severe abdominal pain.  You have chest pain or trouble breathing.  You do not urinate at least once every 8 hours.  You develop cold or clammy skin.  You continue to vomit for longer than 24 to 48 hours.  You have a fever. MAKE SURE YOU:   Understand these instructions.  Will watch your condition.  Will get help right away if you are not doing well or get worse. Document Released: 06/27/2005 Document Revised: 09/19/2011 Document Reviewed: 11/24/2010 [facility] Patient Information 2014 [place], [place].  Smoking Cessation Quitting smoking is important to your health and has many advantages. However, it is not always easy to quit since nicotine is a very addictive drug. Often times, people try 3 times or more before being able to quit. This document explains the best ways for you to prepare to quit smoking. Quitting takes hard work and a lot of effort, but you can do it. ADVANTAGES OF QUITTING SMOKING  You will live longer, feel better, and live better.  Your body will feel the impact of quitting smoking almost immediately.  Within 20 minutes, blood pressure decreases. Your pulse returns to its normal level.  After 8 hours, carbon monoxide levels in the blood return to normal. Your oxygen level increases.  After 24 hours, the chance of having a heart attack starts to decrease. Your breath, hair, and body stop smelling like smoke.  After 48 hours, damaged nerve endings begin to recover. Your sense of taste and smell improve.  After 72 hours, the body is virtually free of nicotine. Your bronchial tubes relax and breathing becomes easier.  After 2 to 12 weeks, lungs can hold more air. Exercise becomes easier and  circulation improves.  The risk of having a heart attack, stroke, cancer, or lung disease is greatly reduced.  After 1 year, the risk of coronary heart disease is cut in half.  After 5 years, the risk of stroke falls to the same as a nonsmoker.  After 10 years, the risk of lung cancer is cut in half and the risk of other cancers decreases significantly.  After 15 years, the risk of coronary heart disease drops, usually to the level of a nonsmoker.  If you are pregnant, quitting smoking will improve your chances of having a healthy baby.  The people you live with, especially any children, will be healthier.  You will have extra money to spend on things other than cigarettes. QUESTIONS  TO THINK ABOUT BEFORE ATTEMPTING TO QUIT You may want to talk about your answers with your caregiver.  Why do you want to quit?  If you tried to quit in the past, what helped and what did not?  What will be the most difficult situations for you after you quit? How will you plan to handle them?  Who can help you through the tough times? Your family? Friends? A caregiver?  What pleasures do you get from smoking? What ways can you still get pleasure if you quit? Here are some questions to ask your caregiver:  How can you help me to be successful at quitting?  What medicine do you think would be best for me and how should I take it?  What should I do if I need more help?  What is smoking withdrawal like? How can I get information on withdrawal? GET READY  Set a quit date.  Change your environment by getting rid of all cigarettes, ashtrays, matches, and lighters in your home, car, or work. Do not let people smoke in your home.  Review your past attempts to quit. Think about what worked and what did not. GET SUPPORT AND ENCOURAGEMENT You have a better chance of being successful if you have help. You can get support in many ways.  Tell your family, friends, and co-workers that you are going to  quit and need their support. Ask them not to smoke around you.  Get individual, group, or telephone counseling and support. Programs are available at [employer] and health centers. Call your local health department for information about programs in your area.  Spiritual beliefs and practices may help some smokers quit.  Download a "quit meter" on your computer to keep track of quit statistics, such as how long you have gone without smoking, cigarettes not smoked, and money saved.  Get a self-help book about quitting smoking and staying off of tobacco. [place] yourself from urges to smoke. Talk to someone, go for a walk, or occupy your time with a task.  Change your normal routine. Take a different route to work. Drink tea instead of coffee. Eat breakfast in a different place.  Reduce your stress. Take a hot bath, exercise, or read a book.  Plan something enjoyable to do every day. Reward yourself for not smoking.  Explore interactive web-based programs that specialize in helping you quit. GET MEDICINE AND USE IT CORRECTLY Medicines can help you stop smoking and decrease the urge to smoke. Combining medicine with the above behavioral methods and support can greatly increase your chances of successfully quitting smoking.  Nicotine replacement therapy helps deliver nicotine to your body without the negative effects and risks of smoking. Nicotine replacement therapy includes nicotine gum, lozenges, inhalers, nasal sprays, and skin patches. Some may be available over-the-counter and others require a prescription.  Antidepressant medicine helps people abstain from smoking, but how this works is unknown. This medicine is available by prescription.  Nicotinic receptor partial agonist medicine simulates the effect of nicotine in your brain. This medicine is available by prescription. Ask your caregiver for advice about which medicines to use and how to use  them based on your health history. Your caregiver will tell you what side effects to look out for if you choose to be on a medicine or therapy. Carefully read the information on the package. Do not use any other product containing nicotine while using a nicotine replacement product.  RELAPSE OR  DIFFICULT SITUATIONS Most relapses occur within the first 3 months after quitting. Do not be discouraged if you start smoking again. Remember, most people try several times before finally quitting. You may have symptoms of withdrawal because your body is used to nicotine. You may crave cigarettes, be irritable, feel very hungry, cough often, get headaches, or have difficulty concentrating. The withdrawal symptoms are only temporary. They are strongest when you first quit, but they will go away within 10 14 days. To reduce the chances of relapse, try to:  Avoid drinking alcohol. Drinking lowers your chances of successfully quitting.  Reduce the amount of caffeine you consume. Once you quit smoking, the amount of caffeine in your body increases and can give you symptoms, such as a rapid heartbeat, sweating, and anxiety.  Avoid smokers because they can make you want to smoke.  Do not let weight gain distract you. Many smokers will gain weight when they quit, usually less than 10 pounds. Eat a healthy diet and stay active. You can always lose the weight gained after you quit.  Find ways to improve your mood other than smoking. FOR MORE INFORMATION  www.smokefree.gov  Document Released: 06/21/2001 Document Revised: 12/27/2011 Document Reviewed: 10/06/2011 [facility] Patient Information 2014 [place], [place].   Hepatitis C Hepatitis C is a viral infection of the liver. Infection may go undetected for months or years because symptoms may be absent or very mild. Chronic liver disease is the main danger of hepatitis C. This may lead to scarring of the liver (cirrhosis), liver failure, and liver cancer. CAUSES    Hepatitis C is caused by the hepatitis C virus (HCV). Formerly, hepatitis C infections were most commonly transmitted through blood transfusions. In the early 1990s, routine testing of donated blood for hepatitis C and exclusion of blood that tests positive for HCV began. Now, HCV is most commonly transmitted from person to person through injection drug use, sharing needles, or sex with an infected person. A caregiver may also get the infection from exposure to the blood of an infected patient by way of a cut or needle stick.  SYMPTOMS  Acute Phase Many cases of acute HCV infection are mild and cause few problems.Some people may not even realize they are sick.Symptoms in others may last a few weeks to several months and include:  Feeling very tired.  Loss of appetite.  Nausea.  Vomiting.  Abdominal pain.  Dark yellow urine.  Yellow skin and eyes (jaundice).  Itching of the skin. Chronic Phase  Between 50% to 85% of people who get HCV infection become "chronic carriers." They often have no symptoms, but the virus stays in their body.They may spread the virus to others and can get long-term liver disease.  Many people with chronic HCV infection remain healthy for many years. However, up to 1 in 5 chronically infected people may develop severe liver diseases including scarring of the liver (cirrhosis), liver failure, or liver cancer. DIAGNOSIS  Diagnosis of hepatitis C infection is made by testing blood for the presence of hepatitis C viral particles called RNA. Other tests may also be done to measure the status of current liver function, exclude other liver problems, or assess liver damage. TREATMENT  Treatment with many antiviral drugs is available and recommended for some patients with chronic HCV infection. Drug treatment is generally considered appropriate for patients who:  Are 76 years of age or older.  Have a positive test for HCV particles in the blood.  Have a liver  tissue  sample (biopsy) that shows chronic hepatitis and significant scarring (fibrosis).  Do not have signs of liver failure.  Have acceptable blood test results that confirm the wellness of other body organs.  Are willing to be treated and conform to treatment requirements.  Have no other circumstances that would prevent treatment from being recommended (contraindications). All people who are offered and choose to receive drug treatment must understand that careful medical follow up for many months and even years is crucial in order to make successful care possible. The goal of drug treatment is to eliminate any evidence of HCV in the blood on a long-term basis. This is called a "sustained virologic response" or SVR. Achieving a SVR is associated with a decrease in the chance of life-threatening liver problems, need for a liver transplant, liver cancer rates, and liver-related complications. Successful treatment currently requires taking treatment drugs for at least 24 weeks and up to 72 weeks. An injected drug (interferon) given weekly and an oral antiviral medicine taken daily are usually prescribed. Side effects from these drugs are common and some may be very serious. Your response to treatment must be carefully monitored by both you and your caregiver throughout the entire treatment period. PREVENTION There is no vaccine for hepatitis C. The only way to prevent the disease is to reduce the risk of exposure to the virus.   Avoid sharing drug needles or personal items like toothbrushes, razors, and nail clippers with an infected person.  Healthcare workers need to avoid injuries and wear appropriate protective equipment such as gloves, gowns, and face masks when performing invasive medical or nursing procedures. HOME CARE INSTRUCTIONS  To avoid making your liver disease worse:  Strictly avoid drinking alcohol.  Carefully review all new prescriptions of medicines with your caregiver. Ask  your caregiver which drugs you should avoid. The following drugs are toxic to the liver, and your caregiver may tell you to avoid them:  Isoniazid.  Methyldopa.  Acetaminophen.  Anabolic steroids (muscle-building drugs).  Erythromycin.  Oral contraceptives (birth control pills).  Check with your caregiver to make sure medicine you are currently taking will not be harmful.  Periodic blood tests may be required. Follow your caregiver's advice about when you should have blood tests.  Avoid a sexual relationship until advised otherwise by your caregiver.  Avoid activities that could expose other people to your blood. Examples include sharing a toothbrush, nail clippers, razors, and needles.  Bed rest is not necessary, but it may make you feel better. Recovery time is not related to the amount of rest you receive.  This infection is contagious. Follow your caregiver's instructions in order to avoid spread of the infection. SEEK IMMEDIATE MEDICAL CARE IF:  You have increasing fatigue or weakness.  You have an oral temperature above 102 F (38.9 C), not controlled by medicine.  You develop loss of appetite, nausea, or vomiting.  You develop jaundice.  You develop easy bruising or bleeding.  You develop any severe problems as a result of your treatment. MAKE SURE YOU:   Understand these instructions.  Will watch your condition.  Will get help right away if you are not doing well or get worse. Document Released: 06/24/2000 Document Revised: 09/19/2011 Document Reviewed: 10/27/2010 [facility] Patient Information 2014 [place], [place].  Doxycycline tablets or capsules What is this medicine? DOXYCYCLINE (dox i SYE kleen) is a tetracycline antibiotic. It kills certain bacteria or stops their growth. It is used to treat many kinds of infections, like dental, skin, respiratory,  and urinary tract infections. It also treats acne, Lyme disease, malaria, and certain sexually transmitted  infections. This medicine may be used for other purposes; ask your health care provider or pharmacist if you have questions. COMMON BRAND NAME(S): Adoxa CK, Adoxa Pak, Adoxa TT, Adoxa, Alodox, Avidoxy, Doxal, Monodox, Morgidox 1x Kit, Morgidox 1x, Morgidox 2x , Morgidox 2x Kit, Ocudox , Vibra-Tabs, Vibramycin What should I tell my health care provider before I take this medicine? They need to know if you have any of these conditions: -liver disease -long exposure to sunlight like working outdoors -stomach problems like colitis -an unusual or allergic reaction to doxycycline, tetracycline antibiotics, other medicines, foods, dyes, or preservatives -pregnant or trying to get pregnant -breast-feeding How should I use this medicine? Take this medicine by mouth with a full glass of water. Follow the directions on the prescription label. It is best to take this medicine without food, but if it upsets your stomach take it with food. Take your medicine at regular intervals. Do not take your medicine more often than directed. Take all of your medicine as directed even if you think you are better. Do not skip doses or stop your medicine early. Talk to your pediatrician regarding the use of this medicine in children. Special care may be needed. While this drug may be prescribed for children as young as 84 years old for selected conditions, precautions do apply. Overdosage: If you think you have taken too much of this medicine contact a poison control center or emergency room at once. NOTE: This medicine is only for you. Do not share this medicine with others. What if I miss a dose? If you miss a dose, take it as soon as you can. If it is almost time for your next dose, take only that dose. Do not take double or extra doses. What may interact with this medicine? -antacids -barbiturates -birth control pills -bismuth subsalicylate -carbamazepine -methoxyflurane -other antibiotics -phenytoin -vitamins  that contain iron -warfarin This list may not describe all possible interactions. Give your health care provider a list of all the medicines, herbs, non-prescription drugs, or dietary supplements you use. Also tell them if you smoke, drink alcohol, or use illegal drugs. Some items may interact with your medicine. What should I watch for while using this medicine? Tell your doctor or health care professional if your symptoms do not improve. Do not treat diarrhea with over the counter products. Contact your doctor if you have diarrhea that lasts more than 2 days or if it is severe and watery. Do not take this medicine just before going to bed. It may not dissolve properly when you lay down and can cause pain in your throat. Drink plenty of fluids while taking this medicine to also help reduce irritation in your throat. This medicine can make you more sensitive to the sun. Keep out of the sun. If you cannot avoid being in the sun, wear protective clothing and use sunscreen. Do not use sun lamps or tanning beds/booths. Birth control pills may not work properly while you are taking this medicine. Talk to your doctor about using an extra method of birth control. If you are being treated for a sexually transmitted infection, avoid sexual contact until you have finished your treatment. Your sexual partner may also need treatment. Avoid antacids, aluminum, calcium, magnesium, and iron products for 4 hours before and 2 hours after taking a dose of this medicine. If you are using this medicine to prevent malaria, you should  still protect yourself from contact with mosquitos. Stay in screened-in areas, use mosquito nets, keep your body covered, and use an insect repellent. What side effects may I notice from receiving this medicine? Side effects that you should report to your doctor or health care professional as soon as possible: -allergic reactions like skin rash, itching or hives, swelling of the face, lips, or  tongue -difficulty breathing -fever -itching in the rectal or genital area -pain on swallowing -redness, blistering, peeling or loosening of the skin, including inside the mouth -severe stomach pain or cramps -unusual bleeding or bruising -unusually weak or tired -yellowing of the eyes or skin Side effects that usually do not require medical attention (report to your doctor or health care professional if they continue or are bothersome): -diarrhea -loss of appetite -nausea, vomiting This list may not describe all possible side effects. Call your doctor for medical advice about side effects. You may report side effects to FDA at 1-800-FDA-1088. Where should I keep my medicine? Keep out of the reach of children. Store at room temperature, below 30 degrees C (86 degrees F). Protect from light. Keep container tightly closed. Throw away any unused medicine after the expiration date. Taking this medicine after the expiration date can make you seriously ill. NOTE: This sheet is a summary. It may not cover all possible information. If you have questions about this medicine, talk to your doctor, pharmacist, or health care provider.  2014, [employer]. (2007-10-16 16:53:02)

## 2013-07-28 NOTE — Progress Notes (Signed)
   CARE MANAGEMENT NOTE 07/28/2013  Patient:  Karen Baldwin, Karen Baldwin   Account Number:  [id number]  Date Initiated:  07/28/2013  Documentation initiated by:  [facility]  Subjective/Objective Assessment:   adm: Abdominal Pain     Action/Plan:   discharge planning   Anticipated DC Date:  07/28/2013   Anticipated DC Plan:    In-house referral  Clinical Social Worker      DC Planning Services  [place] [facility]  Medication Assistance  CM consult      Choice offered to / List presented to:             Status of service:  In process, will continue to follow Medicare Important Message given?   (If response is "NO", the following Medicare IM given date fields will be blank) Date Medicare IM given:   Date Additional Medicare IM given:    Discharge Disposition:    Per UR Regulation:    If discussed at Long Length of Stay Meetings, dates discussed:    Comments:  07/28/13 16:15 CM met with pt in room at 12:15 to discuss medication needs.  Pt has NO MONEY.  Pt is homeless and is staying at the [place] where she states she can continue to stay until mid-February.  Pt has had two earlier ED visits to [facility] in this month of January.  Pt states she is trying to get back on her feet.  Pt has an order for home oxygen as she desats to 87% but bc she has no insurance, and the diagnosis is not of a chronic nature (PNA), 02 request for service will not be rendered.  Also, pt has no money even for the [facility] and would be an excellent candidate for [facility] follow up for orange card, med assist, and PCP.  Weekday liasons may also aid in getting 02 for pt.  Aforementioned explained to MD.  RN states pt will stay with discharge planned for tomorrow. Weekday CM will follow for discharge needs. Mariane Masters, BSN, CM [phone].

## 2013-07-28 NOTE — Discharge Summary (Signed)
Name: Karen Baldwin MRN: [id number] DOB: 28-May-1971 43 y.o. PCP: No Pcp Per Patient-will establish with [facility]   Date of Admission: 07/25/2013  6:17 AM Date of Discharge: 07/29/2013 Attending Physician: Jonah Blue, DO  Discharge Diagnosis: 1. CAP (community acquired pneumonia) with hypoxia 2. Sinusitis  3. Hepatitis C  4. Right upper quadrant and Right Lower Quadrant Abdominal pain  5. Transaminitis, improving 6. Hematuria, chronic  7. Chronic bronchitis  Discharge Medications:   Medication List    STOP taking these medications       acetaminophen 325 MG tablet  Commonly known as:  TYLENOL      TAKE these medications       albuterol 108 (90 BASE) MCG/ACT inhaler  Commonly known as:  PROVENTIL HFA;VENTOLIN HFA  Inhale 2 puffs into the lungs every 6 (six) hours as needed for wheezing or shortness of breath.     amitriptyline 50 MG tablet  Commonly known as:  ELAVIL  Take 50 mg by mouth at bedtime.     benzonatate 100 MG capsule  Commonly known as:  TESSALON  Take 100 mg by mouth 3 (three) times daily as needed for cough (cough).     chlorpheniramine-HYDROcodone 10-8 MG/5ML Lqcr  Commonly known as:  TUSSIONEX PENNKINETIC ER  Take 5 mLs by mouth every 12 (twelve) hours as needed for cough.     citalopram 40 MG tablet  Commonly known as:  CELEXA  Take 40 mg by mouth daily.     dicyclomine 20 MG tablet  Commonly known as:  BENTYL  Take 1 tablet (20 mg total) by mouth 2 (two) times daily.     doxycycline 100 MG tablet  Commonly known as:  VIBRA-TABS  Take 1 tablet (100 mg total) by mouth every 12 (twelve) hours.     hydrOXYzine 50 MG tablet  Commonly known as:  ATARAX/VISTARIL  Take 50 mg by mouth 2 (two) times daily.     ibuprofen 400 MG tablet  Commonly known as:  ADVIL,MOTRIN  Take 1 tablet (400 mg total) by mouth every 8 (eight) hours as needed for fever, mild pain or moderate pain.     ipratropium 17 MCG/ACT inhaler  Commonly  known as:  ATROVENT HFA  Inhale 2 puffs into the lungs every 6 (six) hours.     loratadine 10 MG tablet  Commonly known as:  CLARITIN  Take 10 mg by mouth daily.     mometasone-formoterol 100-5 MCG/ACT Aero  Commonly known as:  DULERA  Inhale 2 puffs into the lungs 2 (two) times daily.     oxymetazoline 0.05 % nasal spray  Commonly known as:  AFRIN  Place 2 sprays into both nostrils 2 (two) times daily.     sodium chloride 0.65 % Soln nasal spray  Commonly known as:  OCEAN  Place 1 spray into both nostrils as needed for congestion.        Disposition and follow-up:   Ms.Karen Baldwin was discharged from [facility] in stable condition.  At the hospital follow up visit please address:  1.  See below 1 & 2   Please see list of resources and call and make an appointment with a regular doctor Monday. You may want to try the [facility].  You will need a repeat Chest Xray in 6-8 weeks to make sure the pneumonia is resolved.  Try to quit smoking.  Take Doxycycline 2x per day for 3  more days.  You will need outpatient pulmonary function tests and repeat of your comprehensive metabolic panel (CMET).  You will need a regular doctor to refer you to a urologist (blood in your urine with history of kidney stones) and Infectious Disease or Gastroenterology for your hepatitis.  Address smoking cessation   Possibly will need outpatient pulmonary function tests   2.  Labs / imaging needed at time of follow-up:  -CMET, CBC with diff, UA, Hepatitis panel, Hep C quantitative and genotype, CXR in 6-8 weeks   3.  Pending labs/ test needing follow-up:  -sputum culture   Follow-up Appointments: Follow-up Information   Follow up with [facility]    . (Aug 12, 2013 at 11:30 am )    Contact information:   [address] [place] [address] [phone]    Patient will call and schedule based on list of  resources  Discharge Instructions:     Discharge Orders   Future Orders Complete By Expires   Diet - low sodium heart healthy  As directed    Discharge instructions  As directed    Comments:     Please see list of resources and call and make an appointment with a regular doctor Monday. You may want to try the [facility].  You will need a repeat Chest Xray in 6-8 weeks to make sure the pneumonia is resolved.  Try to quit smoking.  Take Doxycycline 2x per day for 3 more days.  You will need outpatient pulmonary function tests and repeat of your comprehensive metabolic panel (CMET).  You will need a regular doctor to refer you to a urologist (blood in your urine with history of kidney stones) and Infectious Disease or Gastroenterology for your hepatitis.   Increase activity slowly  As directed    Other Restrictions  As directed    Comments:     You may need to use oxygen with exertion      Consultations: none   Procedures Performed:  Dg Chest 2 View  07/23/2013   CLINICAL DATA:  Cough, congestion  EXAM: CHEST  2 VIEW  COMPARISON:  12/24/2007  FINDINGS: Cardiomediastinal silhouette is stable. No acute infiltrate or pleural effusion. No pulmonary edema. Bony thorax is unremarkable.  IMPRESSION: No active cardiopulmonary disease.   Electronically Signed   By: Natasha Mead Baldwin.D.   On: 07/23/2013 13:33   Ct Abdomen Pelvis W Contrast  07/25/2013   CLINICAL DATA:  Persistent right-sided abdominal pain.  EXAM: CT ABDOMEN AND PELVIS WITH CONTRAST  TECHNIQUE: Multidetector CT imaging of the abdomen and pelvis was performed using the standard protocol following bolus administration of intravenous contrast.  CONTRAST:  80mL OMNIPAQUE IOHEXOL 300 MG/ML  SOLN  COMPARISON:  CT abdomen and pelvis without contrast 09/03/2008. CT abdomen and pelvis with contrast 11/13/2007.  FINDINGS: Patchy ground-glass attenuation is present at the lung bases bilaterally, right greater than left. The  heart size is normal. No significant pleural or pericardial effusion is present.  Intra and extra hepatic biliary dilation has increased. There is a low-density lesion in the right lobe of the liver on image 23 of series 2 which has increased in size, now measuring 17 mm. This represents either a cyst or likely a biloma. The extra hepatic biliary duct measures up to 15 mm. No obstructing lesion is evident.  The spleen is unremarkable. The stomach and duodenum are within normal limits. The pancreas is otherwise unremarkable. No  mass lesion is evident. There is no significant dilation of the pancreatic duct. An 11 mm cyst on the right at 12 mm cyst in the left kidney are stable. The right ureter is mildly dilated to the point where crosses the right iliac artery, likely physiologic.  The rectosigmoid colon is within normal limits. The remainder of the colon is unremarkable. The appendix is visualized and normal. The small bowel is within normal limits. The patient is status post hysterectomy. A low-density lesion associated with the left ovary measures 2.7 cm, likely a benign cyst. The right ovary is unremarkable. No significant adenopathy or free fluid is present.  The bone windows demonstrate no focal lytic or blastic lesions.  IMPRESSION: 1. New by about lateral lower lobe airspace disease. This appears to be more prominent than typical atelectasis and is concerning for early infection or potentially even aspiration. 2. Slight increase in intra and extra hepatic biliary dilation. No obstructing lesion is evident. MRCP could be used for further evaluation if clinically indicated. 3. Status post hysterectomy. A cystic lesion in the left ovary appears benign.   Electronically Signed   By: Gennette Pac Baldwin.D.   On: 07/25/2013 10:25   Mr 3d Recon At Scanner  07/26/2013   CLINICAL DATA:  Abdominal pain. Chronic hepatitis-C. Biliary ductal dilatation seen on CT. Prior cholecystectomy.  EXAM: MRI ABDOMEN WITHOUT AND  WITH CONTRAST (INCLUDING MRCP)  TECHNIQUE: Multiplanar multisequence MR imaging of the abdomen was performed both before and after the administration of intravenous contrast. Heavily T2-weighted images of the biliary and pancreatic ducts were obtained, and three-dimensional MRCP images were rendered by post processing.  CONTRAST:  17mL MULTIHANCE GADOBENATE DIMEGLUMINE 529 MG/ML IV SOLN  COMPARISON:  CT on 07/25/2013  FINDINGS: Bilateral lower lobe pulmonary infiltrates again demonstrated.  Mild diffuse intra and extrahepatic biliary ductal dilatation is seen, with the common bile duct measuring 11 mm. No evidence of choledocholithiasis. No evidence of common bile duct stricture. Patient has undergone previous cholecystectomy. No evidence of pancreatic ductal dilatation.  Several tiny hepatic cysts are noted, but no liver masses are identified. No evidence of pancreatic mass or peripancreatic inflammatory changes. The spleen and adrenal glands are normal in appearance. Small proteinaceous or hemorrhagic renal cysts are seen in the upper poles of both kidneys (these Bosniak category 2), however there is no evidence of renal neoplasm or hydronephrosis.  No other abdominal soft tissue masses or lymphadenopathy identified. No evidence of inflammatory process or abnormal fluid collections.  IMPRESSION: Mild diffuse biliary dilatation with common bile duct measuring 11 mm. No evidence of choledocholithiasis or other obstructing etiology by MRI. This may be related to prior cholecystectomy.  Small benign hepatic and renal cysts.  No evidence of neoplasm.  Bilateral lower lobe infiltrates again noted.   Electronically Signed   By: Myles Rosenthal Baldwin.D.   On: 07/26/2013 11:48   Mr Abd W/wo Cm/mrcp  07/26/2013   CLINICAL DATA:  Abdominal pain. Chronic hepatitis-C. Biliary ductal dilatation seen on CT. Prior cholecystectomy.  EXAM: MRI ABDOMEN WITHOUT AND WITH CONTRAST (INCLUDING MRCP)  TECHNIQUE: Multiplanar multisequence MR  imaging of the abdomen was performed both before and after the administration of intravenous contrast. Heavily T2-weighted images of the biliary and pancreatic ducts were obtained, and three-dimensional MRCP images were rendered by post processing.  CONTRAST:  17mL MULTIHANCE GADOBENATE DIMEGLUMINE 529 MG/ML IV SOLN  COMPARISON:  CT on 07/25/2013  FINDINGS: Bilateral lower lobe pulmonary infiltrates again demonstrated.  Mild diffuse intra and extrahepatic  biliary ductal dilatation is seen, with the common bile duct measuring 11 mm. No evidence of choledocholithiasis. No evidence of common bile duct stricture. Patient has undergone previous cholecystectomy. No evidence of pancreatic ductal dilatation.  Several tiny hepatic cysts are noted, but no liver masses are identified. No evidence of pancreatic mass or peripancreatic inflammatory changes. The spleen and adrenal glands are normal in appearance. Small proteinaceous or hemorrhagic renal cysts are seen in the upper poles of both kidneys (these Bosniak category 2), however there is no evidence of renal neoplasm or hydronephrosis.  No other abdominal soft tissue masses or lymphadenopathy identified. No evidence of inflammatory process or abnormal fluid collections.  IMPRESSION: Mild diffuse biliary dilatation with common bile duct measuring 11 mm. No evidence of choledocholithiasis or other obstructing etiology by MRI. This may be related to prior cholecystectomy.  Small benign hepatic and renal cysts.  No evidence of neoplasm.  Bilateral lower lobe infiltrates again noted.   Electronically Signed   By: Myles Rosenthal Baldwin.D.   On: 07/26/2013 11:48   Admission HPI: Chief Complaint: Fever/chills, cough, nausea/vomiting/abdominal pain  History of Present Illness:  Patient is a 43yo F with a PMH of chronic bronchitis, nephrolithiasis, migraines, Hep C, and tobacco use, who presented with fever, chills, nausea, vomiting, abdominal pain.  Patient reports that she was  recently incarcerated and released from jail on 06/28/14. She had been having cough and sinus pain since 07/12/2013. She saw a provider at the [facility] (a support center for homeless individuals). At that time, she was told that she might have viral infection and return in 10 days if symptoms not improved. Due to continuation of her symptoms, she returned to the [facility] on 07/22/13 and was treated with Azithromycin. She presented to the ED at [facility] the following day with complaints of fever, body aches, nausea, and worsening cough. A CXR at that time was negative. She was discharged with instructions to continue Azithromycin and return if symptoms worsened. She reports that she has been taking her medications, but continue to have fever, chills and cough. She coughs up greenish sputum. She also has SOB, chest pain which is sharp, located in front chest, 5/10 in severity and non-radiating. The chest pain is aggravated by coughing.  Patient reports that she was diagnosed with HCV in 03/2012. She was vaccinated for HAV and HBV. She has chronic abdominal pain in the past 6 months. She has intermittently noticed mucus in her stool, but does not have diarrhea. She has one day history of post-tussive vomiting. She was found in the ED to have elevated LFTs (AST = 268 / ALT = 154) and hematuria on UA. GI was consulted by Ed.  ROS: has dark urine, no dysuria, urgency or frequency, back pain, No leg swelling and rashes.   General: Not in acute distress. She appears distressed, looks uncomfortable.  HEENT: PERRL, EOMI, no scleral icterus, No JVD or bruit. Dry mucous and membrane. Oropharyngeal erythema noted, no tonsillar enlargement or exudation.  Cardiac: S1/S2, RRR, No murmurs, gallops or rubs  Pulm: has crackles over the right lower field posteriorly, No rubs.  Abd: Soft, nondistended, tender over the RUQ, no rebound pain, no organomegaly, BS present  Ext: No edema. 2+DP/PT pulse bilaterally   Musculoskeletal: No joint deformities, erythema, or stiffness, ROM full  Skin: No rashes.  Neuro: Alert and oriented X3, cranial nerves II-XII grossly intact, muscle strength 5/5 in all extremeties, sensation to light touch intact. Brachial reflex 2+ bilaterally.  Knee reflex 1+ bilaterally. Negative Babinski's sign. Normal finger to nose test.  Psych: Patient is not psychotic, no suicidal or hemocidal ideation.  Hospital Course by problem list: 1. CAP (community acquired pneumonia) with hypoxia  2. Sinusitis  3. Hepatitis C  4. Right upper quadrant and Right Lower Quadrant Abdominal pain  5. Transaminitis, improving 6. Hematuria, chronic  7. Chronic bronchitis  43 y.o with PMH chronic bronchitis/COPD, HCV, cholecystectomy, cystic kidneys, h/o kidney stones, depression/anxiety, GERD. She presents for fever,chills, cough, nausea/vomiting, RUQ abdominal pain found to have infiltrates on imaging and transaminitis.   1. CAP (community acquired pneumonia)  She was HIV negative. Legionella/Strep pneumonia negative, negative to date blood cultures and respiratory cultures.  Levaquin was changed to Doxycycline on day 2 due to allergic reaction of itching. She was given Benadryl as needed.  We will treat for total  duration of 7 days.  She ambulated with pulse oximetry 1/17 and desat to 87%and needed 6L O2 to return to 92 %. This admission she was hypoxic with ambulation to mid 80s but improved by discharge after Albuterol/Atrovent neb q6 hours scheduled.  On day of discharge she ambulated with pulse ox and desat;ed to 88% initially then later improved with ambulation to 89-90% after ambulation.  We also gave her Albuterol inhaler q6 as needed, Robitussin DM/Tessalon for cough, as needed Zofran for postussive vomiting.    2. Sinusitis  Changed Levaquin to Doxycycline due to allergic reaction to Levaquin itching. Given as needed Benadryl for acute episode.  She was given Afrin x 3 days and Ocean nasal  spray.     3. Hepatitis C  Will need to follow up and establish outpatient with PCP and possibly GI/ID referral.  Recommend outpatient Hepatitis panel and quantitative value.  She will need Hep A/B vaccinations if not previously vaccinated.   4. Right upper quadrant and Right Lower Quadrant Abdominal pain  Status post gallbladder removal.  She stated pain present x 2 weeks. STD screening was negative 2 weeks ago, appendix is normal on CT, MRCP shows chronic changes due to gallbladder removal. History of ?IBS. She does have renal and liver cysts and left ovarian cyst on imaging.  Avoid liver toxic medications.  We continued Bentyl for IBS and Advil if needed for pain.  She will likely need PCP and/or GI follow up in the future.   5. Transaminitis  AP, ALT, AST elevated on admission and trended down.  Etiology may due to HCV, recent Azithromycin (AZM) use (Monday-Thurs) prior to admission/Tylenol use most likely. Avoid AZM use/Tylenol and liver toxic drugs in the future.  Repeat CMET in the future.  Gastroenterology saw her this admission and agreed with etiology of transaminitis.    6. Hematuria, chronic  Repeat UA this admission with trace hemoglobin.  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    7. Chronic bronchitis  Per history.  If not had already will likely need outpatient pfts. She uses albuterol inhaler at home and still smokes.  Counseled on smoking cessation.  She was given Albuterol/Atrovent nebulizers this admission with relief and at discharge discharged with Atrovent and Dulera inhalers.     She was given Heparin and compression devices for DVT prophylaxis.    Dispo: Back to [employer].  Her family lives in [place] and she has 2 children age 37 and 21.    Discharge Vitals:   BP 111/75  Pulse 84  Temp(Src) 98.1 F (36.7 C) (Oral)  Resp 18  Ht 5\' 8"  (1.727 Baldwin)  Wt 210 lb (95.255 kg)  BMI 31.94  kg/m2  SpO2 97%  Discharge physical exam:  General: resting in bed, NAD  HEENT: Oliver/at, no scleral icterus  Cardiac: RRR, no rubs, murmurs or gallops  Pulm: clear to auscultation bilaterally, no wheezes, rales, or rhonchi  Abd: soft, mild ttp RUQ, RLQ/nondistended, BS present  Ext: warm and well perfused, no pedal edema  Neuro: alert and oriented X3, cranial nerves II-XII grossly intact       Discharge Labs:  Results for Karen, Baldwin (MRN [id number]) as of 07/28/2013 10:48  Ref. Range 07/25/2013 06:50 07/25/2013 19:32 07/26/2013 05:55 07/27/2013 06:30 07/28/2013 06:27  Sodium Latest Range: 137-147 mEq/L 133 (L)  138 143 137  Potassium Latest Range: 3.7-5.3 mEq/L 4.0  4.0 4.3 4.0  Chloride Latest Range: 96-112 mEq/L 97  106 110 101  CO2 Latest Range: 19-32 mEq/L 23  19 21 22   BUN Latest Range: 6-23 mg/dL 8  6 10 15   Creatinine Latest Range: 0.50-1.10 mg/dL 4.09  8.11 9.14 7.82  Calcium Latest Range: 8.4-10.5 mg/dL 8.9  8.2 (L) 8.0 (L) 9.0  GFR calc non Af Amer Latest Range: >90 mL/min >90  >90 >90 >90  GFR calc Af Amer Latest Range: >90 mL/min >90  >90 >90 >90  Glucose Latest Range: 70-99 mg/dL 956 (H)  81 82 88  Alkaline Phosphatase Latest Range: 39-117 U/L 164 (H)  222 (H) 176 (H) 167 (H)  Albumin Latest Range: 3.5-5.2 g/dL 3.8  3.2 (L) 3.1 (L) 3.4 (L)  Lipase Latest Range: 11-59 U/L 20      AST Latest Range: 0-37 U/L 268 (H)  193 (H) 81 (H) 41 (H)  ALT Latest Range: 0-35 U/L 154 (H)  172 (H) 114 (H) 88 (H)  Total Protein Latest Range: 6.0-8.3 g/dL 7.6  6.5 6.6 7.2  Total Bilirubin Latest Range: 0.3-1.2 mg/dL 0.7  0.4 <2.1 (L) <3.0 (L)  Lactic Acid, Venous Latest Range: 0.5-2.2 mmol/L  0.9      Results for Karen, Baldwin (MRN [id number]) as of 07/28/2013 10:48  Ref. Range 07/25/2013 06:50 07/26/2013 05:55 07/27/2013 06:30 07/28/2013 06:27  WBC Latest Range: 4.0-10.5 K/uL 5.3 2.3 (L) 3.4 (L) 4.2  RBC Latest Range: 3.87-5.11 MIL/uL 3.81 (L) 3.54 (L) 3.50 (L) 3.83 (L)  Hemoglobin Latest  Range: 12.0-15.0 g/dL 29.5 (L) 28.4 (L) 13.2 (L) 11.8 (L)  HCT Latest Range: 36.0-46.0 % 35.1 (L) 32.1 (L) 31.9 (L) 34.9 (L)  MCV Latest Range: 78.0-100.0 fL 92.1 90.7 91.1 91.1  MCH Latest Range: 26.0-34.0 pg 31.0 31.1 30.9 30.8  MCHC Latest Range: 30.0-36.0 g/dL 44.0 10.2 72.5 36.6  RDW Latest Range: 11.5-15.5 % 13.3 13.2 13.3 13.1  Platelets Latest Range: 150-400 K/uL 201 183 199 222  Neutrophils Relative % Latest Range: 43-77 % 75  27 (L) 32 (L)  Lymphocytes Relative Latest Range: 12-46 % 16  56 (H) 51 (H)  Monocytes Relative Latest Range: 3-12 % 7  9 9   Eosinophils Relative Latest Range: 0-5 % 1  7 (H) 6 (H)  Basophils Relative Latest Range: 0-1 % 1  1 1   NEUT# Latest Range: 1.7-7.7 K/uL 4.0  0.9 (L) 1.4 (L)  Lymphocytes Absolute Latest Range: 0.7-4.0 K/uL 0.9  2.0 2.2  Monocytes Absolute Latest Range: 0.1-1.0 K/uL 0.4  0.3 0.4  Eosinophils Absolute Latest Range: 0.0-0.7 K/uL 0.1  0.2 0.3  Basophils Absolute Latest Range: 0.0-0.1 K/uL  0.0  0.0 0.0  WBC Morphology No range found   ATYPICAL LYMPHOCYTES    Results for Karen Baldwin, Karen Baldwin (MRN [id number]) as of 07/28/2013 10:48  Ref. Range 07/25/2013 19:36  Prothrombin Time Latest Range: 11.6-15.2 seconds 12.7  INR Latest Range: 0.00-1.49  0.97  Results for Karen Baldwin, Karen Baldwin (MRN [id number]) as of 07/28/2013 10:48  Ref. Range 07/25/2013 21:19  Salicylate Lvl Latest Range: 2.8-20.0 mg/dL <3.0<2.0 (L)  Acetaminophen (Tylenol), Serum Latest Range: 10-30 ug/mL <15.0   Results for Karen Baldwin, Karen Baldwin (MRN [id number]) as of 07/28/2013 10:48  Ref. Range 07/13/2013 09:53 07/25/2013 13:35 07/26/2013 05:55  GC Probe RNA Latest Range: NEGATIVE  NEGATIVE    CT Probe RNA Latest Range: NEGATIVE  NEGATIVE    Influenza A By PCR Latest Range: NEGATIVE   NEGATIVE   Influenza B By PCR Latest Range: NEGATIVE   NEGATIVE   H1N1 flu by pcr Latest Range: NOT DETECTED   NOT DETECTED   Yeast Wet Prep HPF POC Latest Range: NONE SEEN  NONE SEEN    Trich, Wet Prep Latest Range: NONE  SEEN  NONE SEEN    Clue Cells Wet Prep HPF POC Latest Range: NONE SEEN  NONE SEEN    WBC, Wet Prep HPF POC Latest Range: NONE SEEN  FEW (A)    HIV Latest Range: NON REACTIVE    NON REACTIVE   Results for Karen Baldwin, Karen Baldwin (MRN [id number]) as of 07/28/2013 10:48  Ref. Range 07/13/2013 08:07 07/25/2013 08:11 07/25/2013 20:38 07/26/2013 14:13  Color, Urine Latest Range: YELLOW  YELLOW YELLOW  YELLOW  APPearance Latest Range: CLEAR  CLOUDY (A) CLOUDY (A)  CLEAR  Specific Gravity, Urine Latest Range: 1.005-1.030  1.019 1.015  1.015  pH Latest Range: 5.0-8.0  6.0 7.5  6.0  Glucose Latest Range: NEGATIVE mg/dL NEGATIVE NEGATIVE  NEGATIVE  Bilirubin Urine Latest Range: NEGATIVE  NEGATIVE NEGATIVE  NEGATIVE  Ketones, ur Latest Range: NEGATIVE mg/dL NEGATIVE NEGATIVE  NEGATIVE  Protein Latest Range: NEGATIVE mg/dL NEGATIVE NEGATIVE  NEGATIVE  Urobilinogen, UA Latest Range: 0.0-1.0 mg/dL 0.2 0.2  0.2  Nitrite Latest Range: NEGATIVE  NEGATIVE NEGATIVE  NEGATIVE  Leukocytes, UA Latest Range: NEGATIVE  NEGATIVE NEGATIVE  NEGATIVE  Hgb urine dipstick Latest Range: NEGATIVE  SMALL (A) SMALL (A)  TRACE (A)  WBC, UA Latest Range: <3 WBC/hpf 0-2 0-2    RBC / HPF Latest Range: <3 RBC/hpf 0-2 7-10  0-2  Squamous Epithelial / LPF Latest Range: RARE  FEW (A) FEW (A)  RARE  Bacteria, UA Latest Range: RARE  FEW (A) FEW (A)    Legionella Antigen, Urine No range found   Negative for Legi...   Strep Pneumo Urinary Antigen Latest Range: NEGATIVE    NEGATIVE    Results for Karen Baldwin, Karen Baldwin (MRN [id number]) as of 07/28/2013 10:48  Ref. Range 07/25/2013 20:38  Amphetamines Latest Range: NONE DETECTED  NONE DETECTED  Barbiturates Latest Range: NONE DETECTED  NONE DETECTED  Benzodiazepines Latest Range: NONE DETECTED  NONE DETECTED  Opiates Latest Range: NONE DETECTED  NONE DETECTED  COCAINE Latest Range: NONE DETECTED  NONE DETECTED  Tetrahydrocannabinol Latest Range: NONE DETECTED  NONE DETECTED   Blood cultures  NTD   Signed: Annett Gularacy N McLean, MD 07/29/2013, 4:34 PM   Time Spent on Discharge: >30 minutes Services Ordered on Discharge: none  Equipment Ordered on Discharge: home O2

## 2013-07-28 NOTE — Progress Notes (Signed)
CSW spoke with pt and is coordinating with case manager to set up resources.  [facility], spoke with Audie BoxJan Hill [phone]).  Pt okay to discharge with oxygen per Jan as long as she knows all the teachings.  CSW will continue to assist with discharge today.    [phone] (weekend CSW)

## 2013-07-28 NOTE — Progress Notes (Signed)
SATURATION QUALIFICATIONS: (This note is used to comply with regulatory documentation for home oxygen)  Patient Saturations on Room Air at Rest = 97%  Patient Saturations on Room Air while Ambulating = 87%  Patient Saturations on 2 Liters of oxygen while Ambulating = 97%  Please briefly explain why patient needs home oxygen: Karen RearLonnie Mashelle Busick, MSN, RN, [employer] EnergyCMSRN

## 2013-07-28 NOTE — Evaluation (Signed)
Physical Therapy Evaluation Patient Details Name: Karen Baldwin MRN: [id number] DOB: 10-Jun-1971 Today's Date: 07/28/2013 Time: [id number] PT Time Calculation (min): 12 min  PT Assessment / Plan / Recommendation History of Present Illness  Pt admitted for community aquired pneumonia.  [facility]  Clinical Impression  Pt admitted with CAP. Pt currently with functional limitations due to the deficits listed below (see PT Problem List). Pt is mod I for transfers and household ambulation, supervision for community ambulation. O2 sats decreased to upper 80s with ambulation in hallway, but quickly recovered to 94% with standing rest break.  Pt educated on pursed lip breathing to maximize O2 and need for standing rest break when SOB.    PT Assessment  Patent does not need any further PT services    Follow Up Recommendations  No PT follow up    Does the patient have the potential to tolerate intense rehabilitation      Barriers to Discharge        Equipment Recommendations  None recommended by PT    Recommendations for Other Services     Frequency      Precautions / Restrictions Precautions Precautions: None   Pertinent Vitals/Pain 0/10      Mobility  Bed Mobility Overal bed mobility: Independent Transfers Overall transfer level: Modified independent Equipment used: None Ambulation/Gait Ambulation/Gait assistance: Supervision Ambulation Distance (Feet): 350 Feet Assistive device: None Gait Pattern/deviations: WFL(Within Functional Limits) Gait velocity: WFL Gait velocity interpretation: at or above normal speed for age/gender General Gait Details: at rest O2 sat 96 on RA. O2 decreased to upper 80s during ambulation.  Recovered to 94 with <30 sec standing rest break leaning against wall.  2 standing rest breaks required during 350 feet ambulation.    Exercises     PT Diagnosis:    PT Problem List:   PT Treatment Interventions:        PT Goals(Current goals can be found in the care plan section) Acute Rehab PT Goals Patient Stated Goal: feel better PT Goal Formulation: No goals set, d/c therapy  Visit Information  Last PT Received On: 07/28/13 Assistance Needed: +1 History of Present Illness: Pt admitted for community aquired pneumonia.  [facility]       Prior Functioning  Home Living Family/patient expects to be discharged to:: Shelter/Homeless Living Arrangements: Non-relatives/Friends Prior Function Level of Independence: Independent Communication Communication: No difficulties    Cognition  Cognition Arousal/Alertness: Awake/alert Overall Cognitive Status: Within Functional Limits for tasks assessed Memory: Decreased recall of precautions    Extremity/Trunk Assessment     Balance    End of Session PT - End of Session Equipment Utilized During Treatment: Gait belt Activity Tolerance: Patient limited by fatigue Patient left: in bed Nurse Communication: Mobility status  GP     Ilda FoilGarrow, [name] 07/28/2013, 10:54 AM  Aida RaiderWendy Geselle Cardosa, PT  Office # [phone] Pager [phone]

## 2013-07-29 LAB — CULTURE, RESPIRATORY W GRAM STAIN

## 2013-07-29 LAB — CULTURE, RESPIRATORY: CULTURE: NORMAL

## 2013-07-29 MED ORDER — IPRATROPIUM BROMIDE HFA 17 MCG/ACT IN AERS: 2.0000 | INHALATION_SPRAY | Freq: Four times a day (QID) | RESPIRATORY_TRACT | Status: DC

## 2013-07-29 MED ORDER — IPRATROPIUM BROMIDE HFA 17 MCG/ACT IN AERS
2.0000 | INHALATION_SPRAY | Freq: Four times a day (QID) | RESPIRATORY_TRACT | Status: DC
  Filled 2013-07-29: qty 12.9

## 2013-07-29 MED ORDER — IPRATROPIUM BROMIDE 0.02 % IN SOLN: 0.5000 mg | Freq: Four times a day (QID) | RESPIRATORY_TRACT | Status: DC

## 2013-07-29 MED ORDER — IPRATROPIUM-ALBUTEROL 0.5-2.5 (3) MG/3ML IN SOLN
3.0000 mL | Freq: Three times a day (TID) | RESPIRATORY_TRACT | Status: DC
  Administered 2013-07-29: 3 mL via RESPIRATORY_TRACT
  Filled 2013-07-29: qty 3

## 2013-07-29 MED ORDER — TRAMADOL HCL 50 MG PO TABS
50.0000 mg | ORAL_TABLET | Freq: Once | ORAL | Status: AC
  Administered 2013-07-29: 50 mg via ORAL
  Filled 2013-07-29: qty 1

## 2013-07-29 MED ORDER — MOMETASONE FURO-FORMOTEROL FUM 100-5 MCG/ACT IN AERO: 2.0000 | INHALATION_SPRAY | Freq: Two times a day (BID) | RESPIRATORY_TRACT | Status: DC

## 2013-07-29 MED ORDER — MOMETASONE FURO-FORMOTEROL FUM 100-5 MCG/ACT IN AERO
2.0000 | INHALATION_SPRAY | Freq: Two times a day (BID) | RESPIRATORY_TRACT | Status: DC
Start: 2013-07-29 — End: 2013-07-29
  Administered 2013-07-29: 2 via RESPIRATORY_TRACT
  Filled 2013-07-29: qty 8.8

## 2013-07-29 NOTE — Progress Notes (Addendum)
Subjective: Pt states she feels tired and has RUQ, RLQ abdominal pain.    RN: still desats with ambulation.    Objective: Vital signs in last 24 hours: Filed Vitals:   07/28/13 2122 07/29/13 0526 07/29/13 1040 07/29/13 1045  BP: 118/88 109/74    Pulse: 97 79 110 108  Temp: 98.5 F (36.9 C) 98 F (36.7 C)    TempSrc: Oral Oral    Resp: 18 18    Height:      Weight:      SpO2: 93% 93% 95% 88%   Weight change:   Intake/Output Summary (Last 24 hours) at 07/29/13 1224 Last data filed at 07/29/13 0500  Gross per 24 hour  Intake    720 ml  Output      0 ml  Net    720 ml   Vitals reviewed. General: resting in bed, NAD HEENT: [place]/at, no scleral icterus Cardiac: RRR, no rubs, murmurs or gallops Pulm: clear to auscultation bilaterally, no wheezes, rales, or rhonchi Abd: soft, mild ttp RUQ, RLQ/nondistended, BS present Ext: warm and well perfused, no pedal edema Neuro: alert and oriented X3, cranial nerves II-XII grossly intact  Lab Results: Basic Metabolic Panel:  Recent Labs Lab 07/27/13 0630 07/28/13 0627  NA 143 137  K 4.3 4.0  CL 110 101  CO2 21 22  GLUCOSE 82 88  BUN 10 15  CREATININE 0.62 0.63  CALCIUM 8.0* 9.0   Liver Function Tests:  Recent Labs Lab 07/27/13 0630 07/28/13 0627  AST 81* 41*  ALT 114* 88*  ALKPHOS 176* 167*  BILITOT <0.2* <0.2*  PROT 6.6 7.2  ALBUMIN 3.1* 3.4*    Recent Labs Lab 07/25/13 0650  LIPASE 20   CBC:  Recent Labs Lab 07/27/13 0630 07/28/13 0627  WBC 3.4* 4.2  NEUTROABS 0.9* 1.4*  HGB 10.8* 11.8*  HCT 31.9* 34.9*  MCV 91.1 91.1  PLT 199 222   Coagulation:  Recent Labs Lab 07/25/13 1936  LABPROT 12.7  INR 0.97   Urine Drug Screen: Drugs of Abuse     Component Value Date/Time   LABOPIA NONE DETECTED 07/25/2013 2038   COCAINSCRNUR NONE DETECTED 07/25/2013 2038   LABBENZ NONE DETECTED 07/25/2013 2038   AMPHETMU NONE DETECTED 07/25/2013 2038   THCU NONE DETECTED 07/25/2013 2038   LABBARB NONE  DETECTED 07/25/2013 2038    Urinalysis:  Recent Labs Lab 07/25/13 0811 07/26/13 1413  COLORURINE YELLOW YELLOW  LABSPEC 1.015 1.015  PHURINE 7.5 6.0  GLUCOSEU NEGATIVE NEGATIVE  HGBUR SMALL* TRACE*  BILIRUBINUR NEGATIVE NEGATIVE  KETONESUR NEGATIVE NEGATIVE  PROTEINUR NEGATIVE NEGATIVE  UROBILINOGEN 0.2 0.2  NITRITE NEGATIVE NEGATIVE  LEUKOCYTESUR NEGATIVE NEGATIVE   Misc. Labs: None   Micro Results: Recent Results (from the past 240 hour(s))  CULTURE, BLOOD (ROUTINE X 2)     Status: None   Collection Time    07/25/13  7:20 PM      Result Value Range Status   Specimen Description BLOOD RIGHT HAND   Final   Special Requests BOTTLES DRAWN AEROBIC ONLY 2CC   Final   Culture  Setup Time     Final   Value: 07/26/2013 02:23     Performed at [facility]   Culture     Final   Value:        BLOOD CULTURE RECEIVED NO GROWTH TO DATE CULTURE WILL BE HELD FOR 5 DAYS BEFORE ISSUING A FINAL NEGATIVE REPORT     Performed at [facility]   Report Status PENDING   Incomplete  CULTURE, BLOOD (ROUTINE X 2)     Status: None   Collection Time    07/25/13  7:33 PM      Result Value Range Status   Specimen Description BLOOD LEFT HAND   Final   Special Requests BOTTLES DRAWN AEROBIC ONLY 3CC   Final   Culture  Setup Time     Final   Value: 07/26/2013 02:24     Performed at [facility]   Culture     Final   Value:        BLOOD CULTURE RECEIVED NO GROWTH TO DATE CULTURE WILL BE HELD FOR 5 DAYS BEFORE ISSUING A FINAL NEGATIVE REPORT     Performed at [facility]   Report Status PENDING   Incomplete  CULTURE, EXPECTORATED SPUTUM-ASSESSMENT     Status: None   Collection Time    07/26/13  8:18 PM      Result Value Range Status   Specimen Description SPUTUM   Final   Special Requests NONE   Final   Sputum evaluation     Final   Value: THIS SPECIMEN IS ACCEPTABLE. RESPIRATORY CULTURE REPORT TO FOLLOW.   Report Status 07/26/2013 FINAL   Final  CULTURE,  RESPIRATORY (NON-EXPECTORATED)     Status: None   Collection Time    07/26/13  8:18 PM      Result Value Range Status   Specimen Description SPUTUM   Final   Special Requests NONE   Final   Gram Stain     Final   Value: ABUNDANT WBC PRESENT,BOTH PMN AND MONONUCLEAR     NO SQUAMOUS EPITHELIAL CELLS SEEN     NO ORGANISMS SEEN     Performed at [facility]   Culture     Final   Value: NORMAL OROPHARYNGEAL FLORA     Performed at [facility]   Report Status 07/29/2013 FINAL   Final   Studies/Results: No results found. Medications:  Scheduled Meds: . amitriptyline  50 mg Oral QHS  . citalopram  40 mg Oral Daily  . dicyclomine  20 mg Oral BID  . doxycycline  100 mg Oral Q12H  . heparin  5,000 Units Subcutaneous Q8H  . ipratropium-albuterol  3 mL Nebulization TID  . loratadine  10 mg Oral Daily  . oxymetazoline  2 spray Each Nare BID   Continuous Infusions:   PRN Meds:.albuterol, benzonatate, diphenhydrAMINE, diphenhydrAMINE, guaiFENesin-dextromethorphan, ibuprofen, ondansetron (ZOFRAN) IV, ondansetron, polyethylene glycol, senna-docusate, sodium chloride Assessment/Plan: 43 y.o with PMH chronic bronchitis/COPD, HCV, cholecystectomy, cystic kidneys, h/o kidney stones, depression/anxiety, GERD.  She presents for fever,chills, cough, nausea/vomiting, RUQ abdominal pain found to have infiltrates on imaging and transaminitis.    #CAP (community acquired pneumonia) -HIV negative. Legionella/Strep pneumonia negative, pending blood cultures and respiratory cultures   -Levaquin changed to Doxycycline due to allergic Rx itching.  Given prn Benadryl  -will tx with Abx total duration of 7 days -she ambulated with pulse ox and desat to 88% initially then later improved with ambulation to 89-90% after ambulation. -Albuterol/Atrovent neb q4, Albuterol inhaler q6 prn, Robitussin DM/Tessalon for cough, prn Zofran for postussive vomiting  -will d/c when pt not appropriate    #sinusitis  -Changed Levaquin to Doxycycline due to allergic Rx itching.  Given prn Benadryl   -will try Afrin (day 3/3) and Ocean   #Hepatitis C -will need to f/u and establish outpt with PCP and possibly GI/ID  referral  -rec outpatient Hep panel and quant.  -Will need Hep A/B vac if not previously vaccinated.    #RUQ, RLQ Abdominal pain -s/p gallbladder removal; STD screening neg 2 weeks ago, appendix is normal on CT, MRCP shows chronic changes due to gallbladder removal.   H/o ?IBS.  She does have renal and liver cysts and left ovarian cyst.   -Avoid liver toxic medications  -Continued Bentyl for IBS -prn Advil if needed for pain  -She will likely need PCP and/or GI f/u in the future.    #Transaminitis -AP, ALT, AST trending down  -Etiology may due to HCV, recent AZM (Monday-Thurs)/Tylenol use most likely.  -Avoid AZM use/Tylenol and liver toxic drugs in the future  -repeat CMET in the future   #Hematuria, chronic -repeat UA with trace hemoglobin -will likely need to f/u with urology outpatient with h/o chronic RBC urine and kidney stones   #chronic bronchitis  -per pt history. May have underlying COPD with smoking history -if not had already will likely need outpatient pfts.  She uses albuterol inhaler at home and still smokes.  Will likely need inhaled steroid and Atrovent -counseled on smoking cessation -Duoneb q4 hours   #F/E/N -NSL -cardiac diet   #DVT px  -Hep, scds   Dispo: D/c to shelter today   The patient does not have a current PCP (No Pcp Per Patient) and [facility] after discharge with list of resources given.  [facility] at 11:30 am   The patient does have transportation limitations that hinder transportation to clinic appointments.  .Services Needed at time of discharge: Y = Yes, Blank = No PT: none  OT:   RN:   Equipment: Possibly home O2 but cant get O2 d/t no  insurance  Other:     LOS: 4 days   Annett Gularacy N McLean, MD [phone] 07/29/2013, 12:24 PM

## 2013-07-29 NOTE — Discharge Summary (Signed)
[facility], all questions and concerns addressed, MD DC instructions reviewed with pt, home medications returned and rx provided.

## 2013-07-29 NOTE — Discharge Planning (Signed)
NURSING PROGRESS NOTE  Karen Baldwin [id number] Discharge Data: 07/29/2013 8:17 PM Attending Provider: Jonah BlueAlejandro Paya, DO PCP:No PCP Per Patient     [facility]  Discussed with the patient the After Visit Summary and all questions fully answered. All IV's discontinued with no bleeding noted. All belongings returned to patient for patient to take home.   Last Vital Signs:  Blood pressure 111/75, pulse 84, temperature 98.1 F (36.7 C), temperature source Oral, resp. rate 18, height 5\' 8"  (1.727 m), weight 95.255 kg (210 lb), SpO2 97.00%.  Discharge Medication List   Medication List    STOP taking these medications       acetaminophen 325 MG tablet  Commonly known as:  TYLENOL      TAKE these medications       albuterol 108 (90 BASE) MCG/ACT inhaler  Commonly known as:  PROVENTIL HFA;VENTOLIN HFA  Inhale 2 puffs into the lungs every 6 (six) hours as needed for wheezing or shortness of breath.     amitriptyline 50 MG tablet  Commonly known as:  ELAVIL  Take 50 mg by mouth at bedtime.     benzonatate 100 MG capsule  Commonly known as:  TESSALON  Take 100 mg by mouth 3 (three) times daily as needed for cough (cough).     chlorpheniramine-HYDROcodone 10-8 MG/5ML Lqcr  Commonly known as:  TUSSIONEX PENNKINETIC ER  Take 5 mLs by mouth every 12 (twelve) hours as needed for cough.     citalopram 40 MG tablet  Commonly known as:  CELEXA  Take 40 mg by mouth daily.     dicyclomine 20 MG tablet  Commonly known as:  BENTYL  Take 1 tablet (20 mg total) by mouth 2 (two) times daily.     doxycycline 100 MG tablet  Commonly known as:  VIBRA-TABS  Take 1 tablet (100 mg total) by mouth every 12 (twelve) hours.     hydrOXYzine 50 MG tablet  Commonly known as:  ATARAX/VISTARIL  Take 50 mg by mouth 2 (two) times daily.     ibuprofen 400 MG tablet  Commonly known as:  ADVIL,MOTRIN  Take 1 tablet (400 mg total) by mouth every 8 (eight) hours as needed  for fever, mild pain or moderate pain.     ipratropium 17 MCG/ACT inhaler  Commonly known as:  ATROVENT HFA  Inhale 2 puffs into the lungs every 6 (six) hours.     loratadine 10 MG tablet  Commonly known as:  CLARITIN  Take 10 mg by mouth daily.     mometasone-formoterol 100-5 MCG/ACT Aero  Commonly known as:  DULERA  Inhale 2 puffs into the lungs 2 (two) times daily.     oxymetazoline 0.05 % nasal spray  Commonly known as:  AFRIN  Place 2 sprays into both nostrils 2 (two) times daily.     sodium chloride 0.65 % Soln nasal spray  Commonly known as:  OCEAN  Place 1 spray into both nostrils as needed for congestion.

## 2013-07-29 NOTE — Progress Notes (Signed)
Patient's O2 sat is 93% and HR=95 prior to ambulating. O2sat changed to 91% and HR=120 with complaints of shortness of breath after walking halfway down the hall.  O2 sat dropped to 89-90% and HR=114 upon going back to room.  O2 sat went back up to 93% and HR=91 after resting for 5 mins.

## 2013-07-29 NOTE — Care Management Note (Signed)
    Page 1 of 2   07/29/2013     5:20:29 PM   CARE MANAGEMENT NOTE 07/29/2013  Patient:  KELIAH, HARNED   Account Number:  [id number]  Date Initiated:  07/28/2013  Documentation initiated by:  [facility]  Subjective/Objective Assessment:   adm: Abdominal Pain     Action/Plan:   discharge planning   Anticipated DC Date:  07/29/2013   Anticipated DC Plan:  HOME/SELF CARE  In-house referral  [place] Program  Medication Assistance  CM consult      Choice offered to / List presented to:             Status of service:  Completed, signed off Medicare Important Message given?   (If response is "NO", the following Medicare IM given date fields will be blank) Date Medicare IM given:   Date Additional Medicare IM given:    Discharge Disposition:  HOME/SELF CARE  Per UR Regulation:  Reviewed for med. necessity/level of care/duration of stay  If discussed at [place] of Stay Meetings, dates discussed:    Comments:  07/29/13 Hawkins, BSN [phone] patient lives in a shelter at the [facility], she is familiar with [facility] as well.  NCM gave her Match letter for medications , patient is self pay and does not have funds for home oxygen.  MD aware.   07/28/13 16:15 CM met with pt in room at 12:15 to discuss medication needs.  Pt has NO MONEY.  Pt is homeless and is staying at the [facility] where she states she can continue to stay until mid-February.  Pt has had two earlier ED visits to [facility] in this month of January.  Pt states she is trying to get back on her feet.  Pt has an order for home oxygen as she desats to 87% but bc she has no insurance, and the diagnosis is not of a chronic nature (PNA), 02 request for service will not be rendered.  Also, pt has no money even for the [facility] and would be an excellent candidate for [facility] follow up for orange card, med assist, and PCP.  Weekday liasons may also aid in  getting 02 for pt.  Aforementioned explained to MD.  RN states pt will stay with discharge planned for tomorrow. Weekday CM will follow for discharge needs. Mariane Masters, BSN, CM [phone].

## 2013-07-29 NOTE — Progress Notes (Signed)
Patient's O2 sat is 94-97% on room air while ambulating at the hall but dropped down to 88% while walking back to her room.  Heart rate remains low 100's.

## 2013-07-30 NOTE — Discharge Summary (Signed)
  Date: 07/30/2013  Patient name: Karen Baldwin  Medical record number: [id number]  Date of birth: 24-Aug-1970   This patient has been discussed with the house staff. Please see their note for complete details. I concur with their findings and plan.  Jonah BlueAlejandro Mitsue Peery, DO, FACP [facility] 07/30/2013, 4:07 PM

## 2013-08-01 LAB — CULTURE, BLOOD (ROUTINE X 2)
CULTURE: NO GROWTH
Culture: NO GROWTH

## 2013-08-12 ENCOUNTER — Emergency Department (HOSPITAL_COMMUNITY): Payer: No Typology Code available for payment source

## 2013-08-12 ENCOUNTER — Ambulatory Visit: Payer: No Typology Code available for payment source | Attending: Internal Medicine | Admitting: Internal Medicine

## 2013-08-12 ENCOUNTER — Emergency Department (HOSPITAL_COMMUNITY)
Admission: EM | Admit: 2013-08-12 | Discharge: 2013-08-12 | Disposition: A | Discharge status: Home / Self Care | Payer: No Typology Code available for payment source | Attending: Emergency Medicine | Admitting: Emergency Medicine

## 2013-08-12 ENCOUNTER — Encounter (HOSPITAL_COMMUNITY): Payer: Self-pay | Admitting: Emergency Medicine

## 2013-08-12 ENCOUNTER — Encounter: Payer: Self-pay | Admitting: Internal Medicine

## 2013-08-12 VITALS — BP 110/41 | HR 104 | Temp 98.6°F | Resp 14 | Ht 68.0 in | Wt 210.4 lb

## 2013-08-12 DIAGNOSIS — F17200 Nicotine dependence, unspecified, uncomplicated: Secondary | ICD-10-CM | POA: Insufficient documentation

## 2013-08-12 DIAGNOSIS — Z3202 Encounter for pregnancy test, result negative: Secondary | ICD-10-CM | POA: Insufficient documentation

## 2013-08-12 DIAGNOSIS — Z8701 Personal history of pneumonia (recurrent): Secondary | ICD-10-CM | POA: Insufficient documentation

## 2013-08-12 DIAGNOSIS — Z8619 Personal history of other infectious and parasitic diseases: Secondary | ICD-10-CM | POA: Insufficient documentation

## 2013-08-12 DIAGNOSIS — R109 Unspecified abdominal pain: Secondary | ICD-10-CM | POA: Insufficient documentation

## 2013-08-12 DIAGNOSIS — G43909 Migraine, unspecified, not intractable, without status migrainosus: Secondary | ICD-10-CM | POA: Insufficient documentation

## 2013-08-12 DIAGNOSIS — J449 Chronic obstructive pulmonary disease, unspecified: Secondary | ICD-10-CM | POA: Insufficient documentation

## 2013-08-12 DIAGNOSIS — F3289 Other specified depressive episodes: Secondary | ICD-10-CM | POA: Insufficient documentation

## 2013-08-12 DIAGNOSIS — J4489 Other specified chronic obstructive pulmonary disease: Secondary | ICD-10-CM | POA: Insufficient documentation

## 2013-08-12 DIAGNOSIS — F329 Major depressive disorder, single episode, unspecified: Secondary | ICD-10-CM | POA: Insufficient documentation

## 2013-08-12 DIAGNOSIS — F411 Generalized anxiety disorder: Secondary | ICD-10-CM | POA: Insufficient documentation

## 2013-08-12 DIAGNOSIS — R000 Tachycardia, unspecified: Secondary | ICD-10-CM | POA: Insufficient documentation

## 2013-08-12 DIAGNOSIS — Z79899 Other long term (current) drug therapy: Secondary | ICD-10-CM | POA: Insufficient documentation

## 2013-08-12 DIAGNOSIS — Z880 Allergy status to penicillin: Secondary | ICD-10-CM | POA: Insufficient documentation

## 2013-08-12 DIAGNOSIS — B1920 Unspecified viral hepatitis C without hepatic coma: Secondary | ICD-10-CM | POA: Insufficient documentation

## 2013-08-12 DIAGNOSIS — Z8781 Personal history of (healed) traumatic fracture: Secondary | ICD-10-CM | POA: Insufficient documentation

## 2013-08-12 DIAGNOSIS — R319 Hematuria, unspecified: Secondary | ICD-10-CM | POA: Insufficient documentation

## 2013-08-12 DIAGNOSIS — Z8719 Personal history of other diseases of the digestive system: Secondary | ICD-10-CM | POA: Insufficient documentation

## 2013-08-12 DIAGNOSIS — Z87442 Personal history of urinary calculi: Secondary | ICD-10-CM | POA: Insufficient documentation

## 2013-08-12 DIAGNOSIS — G8929 Other chronic pain: Secondary | ICD-10-CM | POA: Insufficient documentation

## 2013-08-12 DIAGNOSIS — J189 Pneumonia, unspecified organism: Secondary | ICD-10-CM | POA: Insufficient documentation

## 2013-08-12 LAB — URINALYSIS, ROUTINE W REFLEX MICROSCOPIC
BILIRUBIN URINE: NEGATIVE
GLUCOSE, UA: NEGATIVE mg/dL
Ketones, ur: NEGATIVE mg/dL
Leukocytes, UA: NEGATIVE
Nitrite: NEGATIVE
PH: 6.5 (ref 5.0–8.0)
Protein, ur: NEGATIVE mg/dL
Specific Gravity, Urine: 1.008 (ref 1.005–1.030)
Urobilinogen, UA: 0.2 mg/dL (ref 0.0–1.0)

## 2013-08-12 LAB — COMPLETE METABOLIC PANEL WITH GFR
ALT: 70 U/L — ABNORMAL HIGH (ref 0–35)
AST: 65 U/L — ABNORMAL HIGH (ref 0–37)
Albumin: 4.3 g/dL (ref 3.5–5.2)
Alkaline Phosphatase: 73 U/L (ref 39–117)
BUN: 13 mg/dL (ref 6–23)
CO2: 23 mEq/L (ref 19–32)
Calcium: 9.5 mg/dL (ref 8.4–10.5)
Chloride: 105 mEq/L (ref 96–112)
Creat: 0.85 mg/dL (ref 0.50–1.10)
GFR, Est African American: >89 mL/min
GFR, Est Non African American: 85 mL/min
Glucose, Bld: 89 mg/dL (ref 70–99)
Potassium: 4.1 mEq/L (ref 3.5–5.3)
Sodium: 136 mEq/L (ref 135–145)
Total Bilirubin: 0.3 mg/dL (ref 0.2–1.2)
Total Protein: 7 g/dL (ref 6.0–8.3)

## 2013-08-12 LAB — CBC WITH DIFFERENTIAL/PLATELET
BASOS ABS: 0 10*3/uL (ref 0.0–0.1)
BASOS PCT: 1 % (ref 0–1)
Basophils Absolute: 0 10*3/uL (ref 0.0–0.1)
Basophils Relative: 1 % (ref 0–1)
Eosinophils Absolute: 0.3 10*3/uL (ref 0.0–0.7)
Eosinophils Absolute: 0.3 10*3/uL (ref 0.0–0.7)
Eosinophils Relative: 6 % — ABNORMAL HIGH (ref 0–5)
Eosinophils Relative: 6 % — ABNORMAL HIGH (ref 0–5)
HCT: 35.2 % — ABNORMAL LOW (ref 36.0–46.0)
HEMATOCRIT: 32.9 % — AB (ref 36.0–46.0)
HEMOGLOBIN: 12.2 g/dL (ref 12.0–15.0)
Hemoglobin: 11.4 g/dL — ABNORMAL LOW (ref 12.0–15.0)
LYMPHS ABS: 1.4 10*3/uL (ref 0.7–4.0)
LYMPHS PCT: 27 % (ref 12–46)
Lymphocytes Relative: 30 % (ref 12–46)
Lymphs Abs: 1.7 10*3/uL (ref 0.7–4.0)
MCH: 30 pg (ref 26.0–34.0)
MCH: 31.3 pg (ref 26.0–34.0)
MCHC: 34.7 g/dL (ref 30.0–36.0)
MCHC: 34.7 g/dL (ref 30.0–36.0)
MCV: 86.7 fL (ref 78.0–100.0)
MCV: 90.4 fL (ref 78.0–100.0)
MONO ABS: 0.5 10*3/uL (ref 0.1–1.0)
Monocytes Absolute: 0.5 10*3/uL (ref 0.1–1.0)
Monocytes Relative: 10 % (ref 3–12)
Monocytes Relative: 8 % (ref 3–12)
NEUTROS ABS: 3.7 10*3/uL (ref 1.7–7.7)
NEUTROS PCT: 53 % (ref 43–77)
Neutro Abs: 2.5 10*3/uL (ref 1.7–7.7)
Neutrophils Relative %: 59 % (ref 43–77)
Platelets: 363 10*3/uL (ref 150–400)
Platelets: 303 10*3/uL (ref 150–400)
RBC: 4.06 MIL/uL (ref 3.87–5.11)
RBC: 3.64 MIL/uL — ABNORMAL LOW (ref 3.87–5.11)
RDW: 13.4 % (ref 11.5–15.5)
RDW: 13.2 % (ref 11.5–15.5)
WBC: 4.6 10*3/uL (ref 4.0–10.5)
WBC: 6.2 10*3/uL (ref 4.0–10.5)

## 2013-08-12 LAB — BASIC METABOLIC PANEL
BUN: 13 mg/dL (ref 6–23)
CHLORIDE: 105 meq/L (ref 96–112)
CO2: 21 meq/L (ref 19–32)
Calcium: 8.6 mg/dL (ref 8.4–10.5)
Creatinine, Ser: 0.74 mg/dL (ref 0.50–1.10)
GFR calc Af Amer: >90 mL/min (ref >90)
GFR calc non Af Amer: >90 mL/min (ref >90)
Glucose, Bld: 97 mg/dL (ref 70–99)
POTASSIUM: 4.1 meq/L (ref 3.7–5.3)
Sodium: 140 mEq/L (ref 137–147)

## 2013-08-12 LAB — POCT PREGNANCY, URINE: Preg Test, Ur: NEGATIVE

## 2013-08-12 LAB — URINE MICROSCOPIC-ADD ON

## 2013-08-12 MED ORDER — METRONIDAZOLE 500 MG PO TABS: 500.0000 mg | ORAL_TABLET | Freq: Three times a day (TID) | ORAL | Status: DC

## 2013-08-12 MED ORDER — SODIUM CHLORIDE 0.9 % IV BOLUS (SEPSIS)
1000.0000 mL | Freq: Once | INTRAVENOUS | Status: AC
  Administered 2013-08-12: 1000 mL via INTRAVENOUS

## 2013-08-12 MED ORDER — ONDANSETRON HCL 4 MG/2ML IJ SOLN
4.0000 mg | Freq: Once | INTRAMUSCULAR | Status: AC
  Administered 2013-08-12: 4 mg via INTRAVENOUS
  Filled 2013-08-12: qty 2

## 2013-08-12 MED ORDER — HYDROMORPHONE HCL PF 1 MG/ML IJ SOLN
1.0000 mg | Freq: Once | INTRAMUSCULAR | Status: AC
Start: 2013-08-12 — End: 2013-08-12
  Administered 2013-08-12: 1 mg via INTRAVENOUS
  Filled 2013-08-12: qty 1

## 2013-08-12 MED ORDER — SODIUM CHLORIDE 0.9 % IV SOLN
INTRAVENOUS | Status: DC
Start: 2013-08-12 — End: 2013-08-13
  Administered 2013-08-12: 18:00:00 via INTRAVENOUS

## 2013-08-12 NOTE — Progress Notes (Signed)
LCSW met with patient in order to develop a treatment plan and identify necessary resources.  LCSW advised patient to complete the hospital discount program with the financial counselor.  LCSW also made appointment for patient to apply for orange card in March.  LCSW also connected patient with PASS coordinator for some of her medications.    LCSW will follow as needed.  Tywan J Lindsey MSW, LCSW Duration 30 min 

## 2013-08-12 NOTE — ED Notes (Signed)
Pt c/o left sided flank pain that started 2 days ago, pain worsened last night. Tried taking ibuprofen at home but no relief. Pt denies burning or any discomfort with urination. sts she has a hx of kidney stones and this feels very similar. Nad, skin warm and dry, resp e/u. Pt crying.

## 2013-08-12 NOTE — ED Notes (Signed)
Attempted to draw bld while starting IV, unsuccessful.

## 2013-08-12 NOTE — Progress Notes (Signed)
Pt is here form a hospital f/u for pneumonia. Pt is hypotensive today. Had blood in urine and enzymes were elevated. While pt is walking oxygen level decreases; shortness of breath x3 weeks. Also complains of severe nausea and abdominal pain x3 weeks. Abdominal pain has worsened x3 days; fatigue, headaches, loss of appetite. Pt also suffers form anxiety and has some weight loss. Has had severe diarrhea.

## 2013-08-12 NOTE — Discharge Instructions (Signed)

## 2013-08-12 NOTE — ED Provider Notes (Signed)
CSN: [id number]     Arrival date & time 08/12/13  1339 History   First MD Initiated Contact with Patient 08/12/13 1629     Chief Complaint  Patient presents with  . Flank Pain   (Consider location/radiation/quality/duration/timing/severity/associated sxs/prior Treatment) Patient is a 43 y.o. female presenting with flank pain. The history is provided by the patient.  Flank Pain   patient here complaining of 2 days of left-sided sharp flank pain similar to her prior renal colic. She notes chronic hematuria x1 year without a definitive diagnosis. Denies any fever or chills. She does have chronic dyspnea which is no different than her prior baseline. Was recently hospitalized for pneumonia and went to see her Dr. today for followup visit. Those records reviewed. She denies any fever or cough x24 hours. Notes new treatments used for her pain at her flank and it is not worse with positions. There is no radicular symptoms.  Past Medical History  Diagnosis Date  . Chronic bronchitis     "get it sometimes twice/yr" (07/25/2013)  . Pelvic fracture     non-surgical mgt.   . Vaginal delivery 1999  . Depression   . COPD (chronic obstructive pulmonary disease)   . Pneumonia 1979; 2004    "double";   Marland Kitchen CAP (community acquired pneumonia) 07/25/2013  . Shortness of breath     "comes on at anytime" (07/25/2013)  . GERD (gastroesophageal reflux disease)   . Hepatitis C 03/2012    diagn[facility].    . Migraine     "once or twice/month" (07/25/2013)  . Chronic lower back pain   . Anxiety   . Kidney stone     "passed on my own"   Past Surgical History  Procedure Laterality Date  . Cholecystectomy  2009    in [place]  . Tubal ligation  1999  . Vaginal hysterectomy  2001    "partial", for menorrhagia associated with uterine fibroids.   Marland Kitchen Hysteroscopy  1999   History reviewed. No pertinent family history. History  Substance Use Topics  . Smoking status: Current Every Day  Smoker -- 0.50 packs/day for 15 years    Types: Cigarettes  . Smokeless tobacco: Never Used  . Alcohol Use: No   OB History   Grav Para Term Preterm Abortions TAB SAB Ect Mult Living                 Review of Systems  Genitourinary: Positive for flank pain.  All other systems reviewed and are negative.    Allergies  Levaquin; Bactrim; Levaquin; and Penicillins  Home Medications   Current Outpatient Rx  Name  Route  Sig  Dispense  Refill  . albuterol (PROVENTIL HFA;VENTOLIN HFA) 108 (90 BASE) MCG/ACT inhaler   Inhalation   Inhale 2 puffs into the lungs every 6 (six) hours as needed for wheezing or shortness of breath.         Marland Kitchen amitriptyline (ELAVIL) 50 MG tablet   Oral   Take 50 mg by mouth at bedtime.         . citalopram (CELEXA) 40 MG tablet   Oral   Take 40 mg by mouth daily.         Marland Kitchen dicyclomine (BENTYL) 20 MG tablet   Oral   Take 1 tablet (20 mg total) by mouth 2 (two) times daily.   20 tablet   0   . hydrOXYzine (ATARAX/VISTARIL) 50 MG tablet   Oral  Take 50 mg by mouth 2 (two) times daily.         Marland Kitchen. ibuprofen (ADVIL,MOTRIN) 400 MG tablet   Oral   Take 1 tablet (400 mg total) by mouth every 8 (eight) hours as needed for fever, mild pain or moderate pain.   90 tablet   0   . ipratropium (ATROVENT HFA) 17 MCG/ACT inhaler   Inhalation   Inhale 2 puffs into the lungs every 6 (six) hours.   1 Inhaler   12   . loratadine (CLARITIN) 10 MG tablet   Oral   Take 10 mg by mouth daily.         . mometasone-formoterol (DULERA) 100-5 MCG/ACT AERO   Inhalation   Inhale 2 puffs into the lungs 2 (two) times daily.         . promethazine (PHENERGAN) 25 MG tablet   Oral   Take 25 mg by mouth every 6 (six) hours as needed for nausea or vomiting.          BP 132/99  Pulse 95  Temp(Src) 98.7 F (37.1 C) (Oral)  Resp 18  Ht 5\' 8"  (1.727 m)  Wt 210 lb (95.255 kg)  BMI 31.94 kg/m2  SpO2 100% Physical Exam  Nursing note and vitals  reviewed. Constitutional: She is oriented to person, place, and time. She appears well-developed and well-nourished.  Non-toxic appearance. No distress.  HENT:  Head: Normocephalic and atraumatic.  Eyes: Conjunctivae, EOM and lids are normal. Pupils are equal, round, and reactive to light.  Neck: Normal range of motion. Neck supple. No tracheal deviation present. No mass present.  Cardiovascular: Regular rhythm and normal heart sounds.  Tachycardia present.  Exam reveals no gallop.   No murmur heard. Pulmonary/Chest: Effort normal and breath sounds normal. No stridor. No respiratory distress. She has no decreased breath sounds. She has no wheezes. She has no rhonchi. She has no rales.  Abdominal: Soft. Normal appearance and bowel sounds are normal. She exhibits no distension. There is no tenderness. There is no rebound and no CVA tenderness.  Musculoskeletal: Normal range of motion. She exhibits no edema and no tenderness.  Neurological: She is alert and oriented to person, place, and time. She has normal strength. No cranial nerve deficit or sensory deficit. GCS eye subscore is 4. GCS verbal subscore is 5. GCS motor subscore is 6.  Skin: Skin is warm and dry. No abrasion and no rash noted.  Psychiatric: She has a normal mood and affect. Her speech is normal and behavior is normal.    ED Course  Procedures (including critical care time) Labs Review Labs Reviewed  URINALYSIS, ROUTINE W REFLEX MICROSCOPIC - Abnormal; Notable for the following:    Hgb urine dipstick SMALL (*)    All other components within normal limits  URINE MICROSCOPIC-ADD ON  CBC WITH DIFFERENTIAL  BASIC METABOLIC PANEL  POCT PREGNANCY, URINE   Imaging Review No results found.  EKG Interpretation   None       MDM  No diagnosis found. Patient given IV fluids and pain meds here feels better. She is stable for discharge    Toy BakerAnthony T Maybree Riling, MD 08/12/13 2041

## 2013-08-12 NOTE — ED Notes (Signed)
Pt returned from radiology. Ambulated to restroom with no issues. 

## 2013-08-12 NOTE — Progress Notes (Signed)
Patient ID: Karen Baldwin, female   DOB: July 20, 1970, 43 y.o.   MRN: [id number]   CC:  HPI:  42 year old female recently admitted to the hospital with community-acquired pneumonia and was diagnosed with hepatitis C. The patient is here for a followup. She has completed 2 days of doxycycline following discharge, completed antibiotics on 1/21. She complains of dizziness, intermittent shortness of breath. She has cut down on her smoking. During hospitalization she had a CT scan on 1/15 was found to have internal extrahepatic biliary dilatation. She was diagnosed with hepatitis C. MRCP on 1/16 showed no evidence of choledocholithiasis, no neoplasm. Initial liver function test was AST of 268 ALT of 154 Since her discharge the patient has had intermittent abdominal pain, states that she has a stool every time she pees  She was also found to have hematuria on her urinalysis, she is requesting a urology referral Gastroenterology referral for hepatitis C    Allergies  Allergen Reactions  . Levaquin [Levofloxacin] Hives    Patient with pruritis and erythema at IV site after Levofloxacin adminisation  . Bactrim [Sulfamethoxazole-Trimethoprim] Hives    "skin burning"  . Levaquin [Levofloxacin In D5w] Itching  . Penicillins Other (See Comments)    unknown   Past Medical History  Diagnosis Date  . Chronic bronchitis     "get it sometimes twice/yr" (07/25/2013)  . Pelvic fracture     non-surgical mgt.   . Vaginal delivery 1999  . Depression   . COPD (chronic obstructive pulmonary disease)   . Pneumonia 1979; 2004    "double";   Marland Kitchen CAP (community acquired pneumonia) 07/25/2013  . Shortness of breath     "comes on at anytime" (07/25/2013)  . GERD (gastroesophageal reflux disease)   . Hepatitis C 03/2012    diagnosed by labs done at prison.    . Migraine     "once or twice/month" (07/25/2013)  . Chronic lower back pain   . Anxiety   . Kidney stone     "passed on my own"   Current Outpatient  Prescriptions on File Prior to Visit  Medication Sig Dispense Refill  . albuterol (PROVENTIL HFA;VENTOLIN HFA) 108 (90 BASE) MCG/ACT inhaler Inhale 2 puffs into the lungs every 6 (six) hours as needed for wheezing or shortness of breath.      Marland Kitchen amitriptyline (ELAVIL) 50 MG tablet Take 50 mg by mouth at bedtime.      . citalopram (CELEXA) 40 MG tablet Take 40 mg by mouth daily.      Marland Kitchen dicyclomine (BENTYL) 20 MG tablet Take 1 tablet (20 mg total) by mouth 2 (two) times daily.  20 tablet  0  . hydrOXYzine (ATARAX/VISTARIL) 50 MG tablet Take 50 mg by mouth 2 (two) times daily.      Marland Kitchen ibuprofen (ADVIL,MOTRIN) 400 MG tablet Take 1 tablet (400 mg total) by mouth every 8 (eight) hours as needed for fever, mild pain or moderate pain.  90 tablet  0  . loratadine (CLARITIN) 10 MG tablet Take 10 mg by mouth daily.      . benzonatate (TESSALON) 100 MG capsule Take 100 mg by mouth 3 (three) times daily as needed for cough (cough).      . chlorpheniramine-HYDROcodone (TUSSIONEX PENNKINETIC ER) 10-8 MG/5ML LQCR Take 5 mLs by mouth every 12 (twelve) hours as needed for cough.  115 mL  0  . doxycycline (VIBRA-TABS) 100 MG tablet Take 1 tablet (100 mg total) by mouth every 12 (twelve) hours.  6  tablet  0  . ipratropium (ATROVENT HFA) 17 MCG/ACT inhaler Inhale 2 puffs into the lungs every 6 (six) hours.  1 Inhaler  12  . mometasone-formoterol (DULERA) 100-5 MCG/ACT AERO Inhale 2 puffs into the lungs 2 (two) times daily.      . sodium chloride (OCEAN) 0.65 % SOLN nasal spray Place 1 spray into both nostrils as needed for congestion.  30 mL  0   No current facility-administered medications on file prior to visit.   No family history on file. History   Social History  . Marital Status: Single    Spouse Name: N/A    Number of Children: 2  . Years of Education: N/A   Occupational History  . Not on file.   Social History Main Topics  . Smoking status: Current Every Day Smoker -- 0.50 packs/day for 15 years     Types: Cigarettes  . Smokeless tobacco: Never Used  . Alcohol Use: No  . 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 Sexual Activity: Not Currently   Other Topics Concern  . Not on file   Social History Narrative   Patient was recently discharged from prison on December 19th, 2014. Currently homeless and staying at the [facility].     Review of Systems  Constitutional: As in history of present illness HENT: Negative for ear pain, nosebleeds, congestion, facial swelling, rhinorrhea, neck pain, neck stiffness and ear discharge.   Eyes: Negative for pain, discharge, redness, itching and visual disturbance.  Respiratory: Negative for cough, choking, chest tightness, shortness of breath, wheezing and stridor.   Cardiovascular: Negative for chest pain, palpitations and leg swelling.  Gastrointestinal: As in history of present illness Genitourinary: Negative for dysuria, urgency, frequency, hematuria, flank pain, decreased urine volume, difficulty urinating and dyspareunia.  Musculoskeletal: Negative for back pain, joint swelling, arthralgias and gait problem.  Neurological: Negative for dizziness, tremors, seizures, syncope, facial asymmetry, speech difficulty, weakness, light-headedness, numbness and headaches.  Hematological: Negative for adenopathy. Does not bruise/bleed easily.  Psychiatric/Behavioral: Negative for hallucinations, behavioral problems, confusion, dysphoric mood, decreased concentration and agitation.    Objective:   Filed Vitals:   08/12/13 1049  BP: 110/41  Pulse: 104  Temp: 98.6 F (37 C)  Resp: 14    Physical Exam  Constitutional: Appears well-developed and well-nourished. No distress.  HENT: Normocephalic. External right and left ear normal. Oropharynx is clear and moist.  Eyes: Conjunctivae and EOM are normal. PERRLA, no scleral icterus.  Neck: Normal ROM. Neck supple. No JVD. No tracheal  deviation. No thyromegaly.  CVS: RRR, S1/S2 +, no murmurs, no gallops, no carotid bruit.  Pulmonary: Effort and breath sounds normal, no stridor, rhonchi, wheezes, rales.  Abdominal: Soft. BS +,  no distension, tenderness left and right lower quadrant, rebound or guarding.  Musculoskeletal: Normal range of motion. No edema and no tenderness.  Lymphadenopathy: No lymphadenopathy noted, cervical, inguinal. Neuro: Alert. Normal reflexes, muscle tone coordination. No cranial nerve deficit. Skin: Skin is warm and dry. No rash noted. Not diaphoretic. No erythema. No pallor.  Psychiatric: Normal mood and affect. Behavior, judgment, thought content normal.   Lab Results  Component Value Date   WBC 4.2 07/28/2013   HGB 11.8* 07/28/2013   HCT 34.9* 07/28/2013   MCV 91.1 07/28/2013   PLT 222 07/28/2013   Lab Results  Component Value Date   CREATININE 0.63 07/28/2013   BUN  15 07/28/2013   NA 137 07/28/2013   K 4.0 07/28/2013   CL 101 07/28/2013   CO2 22 07/28/2013    No results found for this basename: HGBA1C   Lipid Panel  No results found for this basename: chol, trig, hdl, cholhdl, vldl, ldlcalc       Assessment and plan:   Patient Active Problem List   Diagnosis Date Noted  . Tobacco abuse 07/28/2013  . Hypoxia 07/27/2013  . Sinusitis 07/26/2013  . Transaminitis 07/26/2013  . Hematuria 07/26/2013  . CAP (community acquired pneumonia) 07/25/2013  . Abdominal pain 07/25/2013  . Nonspecific (abnormal) findings on radiological and other examination of biliary tract 07/25/2013  . Nonspecific elevation of levels of transaminase or lactic acid dehydrogenase (LDH) 07/25/2013  . Hepatitis C   . Chronic bronchitis   . Renal disorder    Pneumonia Completed course of antibiotics Will schedule for a chest x-ray Advised to quit smoking No fever therefore no repeat antibiotics indicated Continue as needed albuterol If shortness of breath persists, she will need pulmonary  testing   Abdominal pain Patient on Bentyl Because of diarrhea will rule out C. difficile Flagyl empirically, to 3 weeks We'll discontinue if C. difficile is negative  Hematuria Obtain UA Urology consultation if still having gross hematuria  History of hepatitis C Hepatitis C Genotype, quantitative Gastroenterology referral Hepatitis B vaccination Hepatitis A  [facility] since we do not carry hepatitis A vaccine  Follow up in one month           The patient was given clear instructions to go to ER or return to medical center if symptoms don't improve, worsen or new problems develop. The patient verbalized understanding. The patient was told to call to get any lab results if not heard anything in the next week.

## 2013-08-12 NOTE — ED Notes (Signed)
Patient has hx of kidney stones and has stabbing pain in left flank area, sts getting worse with time.

## 2013-08-13 LAB — HEPATITIS C RNA QUANTITATIVE
HCV Quantitative Log: 4.9 {Log} — ABNORMAL HIGH (ref <1.18)
HCV Quantitative: 79494 IU/mL — ABNORMAL HIGH (ref <15)

## 2013-08-13 LAB — URINALYSIS
BILIRUBIN URINE: NEGATIVE
GLUCOSE, UA: NEGATIVE mg/dL
Hgb urine dipstick: NEGATIVE
KETONES UR: NEGATIVE mg/dL
LEUKOCYTES UA: NEGATIVE
Nitrite: NEGATIVE
PROTEIN: NEGATIVE mg/dL
Specific Gravity, Urine: 1.006 (ref 1.005–1.030)
Urobilinogen, UA: 0.2 mg/dL (ref 0.0–1.0)
pH: 6 (ref 5.0–8.0)

## 2013-08-15 LAB — HEPATITIS C GENOTYPE: HCV Genotype: 2

## 2013-09-02 ENCOUNTER — Emergency Department (HOSPITAL_COMMUNITY): Payer: No Typology Code available for payment source

## 2013-09-02 ENCOUNTER — Encounter (HOSPITAL_COMMUNITY): Payer: Self-pay | Admitting: Emergency Medicine

## 2013-09-02 ENCOUNTER — Emergency Department (HOSPITAL_COMMUNITY)
Admission: EM | Admit: 2013-09-02 | Discharge: 2013-09-02 | Disposition: A | Discharge status: Home / Self Care | Payer: No Typology Code available for payment source | Attending: Emergency Medicine | Admitting: Emergency Medicine

## 2013-09-02 DIAGNOSIS — F411 Generalized anxiety disorder: Secondary | ICD-10-CM | POA: Insufficient documentation

## 2013-09-02 DIAGNOSIS — J449 Chronic obstructive pulmonary disease, unspecified: Secondary | ICD-10-CM | POA: Insufficient documentation

## 2013-09-02 DIAGNOSIS — Z880 Allergy status to penicillin: Secondary | ICD-10-CM | POA: Insufficient documentation

## 2013-09-02 DIAGNOSIS — IMO0002 Reserved for concepts with insufficient information to code with codable children: Secondary | ICD-10-CM | POA: Insufficient documentation

## 2013-09-02 DIAGNOSIS — Z8719 Personal history of other diseases of the digestive system: Secondary | ICD-10-CM | POA: Insufficient documentation

## 2013-09-02 DIAGNOSIS — G8929 Other chronic pain: Secondary | ICD-10-CM | POA: Insufficient documentation

## 2013-09-02 DIAGNOSIS — R269 Unspecified abnormalities of gait and mobility: Secondary | ICD-10-CM | POA: Insufficient documentation

## 2013-09-02 DIAGNOSIS — F329 Major depressive disorder, single episode, unspecified: Secondary | ICD-10-CM | POA: Insufficient documentation

## 2013-09-02 DIAGNOSIS — Z8781 Personal history of (healed) traumatic fracture: Secondary | ICD-10-CM | POA: Insufficient documentation

## 2013-09-02 DIAGNOSIS — Z79899 Other long term (current) drug therapy: Secondary | ICD-10-CM | POA: Insufficient documentation

## 2013-09-02 DIAGNOSIS — F17200 Nicotine dependence, unspecified, uncomplicated: Secondary | ICD-10-CM | POA: Insufficient documentation

## 2013-09-02 DIAGNOSIS — Z8701 Personal history of pneumonia (recurrent): Secondary | ICD-10-CM | POA: Insufficient documentation

## 2013-09-02 DIAGNOSIS — J4489 Other specified chronic obstructive pulmonary disease: Secondary | ICD-10-CM | POA: Insufficient documentation

## 2013-09-02 DIAGNOSIS — Y929 Unspecified place or not applicable: Secondary | ICD-10-CM | POA: Insufficient documentation

## 2013-09-02 DIAGNOSIS — Y939 Activity, unspecified: Secondary | ICD-10-CM | POA: Insufficient documentation

## 2013-09-02 DIAGNOSIS — W010XXA Fall on same level from slipping, tripping and stumbling without subsequent striking against object, initial encounter: Secondary | ICD-10-CM | POA: Insufficient documentation

## 2013-09-02 DIAGNOSIS — Z8619 Personal history of other infectious and parasitic diseases: Secondary | ICD-10-CM | POA: Insufficient documentation

## 2013-09-02 DIAGNOSIS — F3289 Other specified depressive episodes: Secondary | ICD-10-CM | POA: Insufficient documentation

## 2013-09-02 DIAGNOSIS — S93409A Sprain of unspecified ligament of unspecified ankle, initial encounter: Secondary | ICD-10-CM | POA: Insufficient documentation

## 2013-09-02 DIAGNOSIS — G43909 Migraine, unspecified, not intractable, without status migrainosus: Secondary | ICD-10-CM | POA: Insufficient documentation

## 2013-09-02 DIAGNOSIS — Z87442 Personal history of urinary calculi: Secondary | ICD-10-CM | POA: Insufficient documentation

## 2013-09-02 MED ORDER — HYDROCODONE-ACETAMINOPHEN 5-325 MG PO TABS
1.0000 | ORAL_TABLET | Freq: Once | ORAL | Status: AC
Start: 2013-09-02 — End: 2013-09-02
  Administered 2013-09-02: 1 via ORAL
  Filled 2013-09-02: qty 1

## 2013-09-02 MED ORDER — TRAMADOL HCL 50 MG PO TABS: 50.0000 mg | ORAL_TABLET | Freq: Four times a day (QID) | ORAL | Status: DC | PRN

## 2013-09-02 MED ORDER — HYDROCODONE-ACETAMINOPHEN 5-325 MG PO TABS: 2.0000 | ORAL_TABLET | ORAL | Status: DC | PRN

## 2013-09-02 NOTE — Progress Notes (Signed)
[facility] did not get to see patient but will be sending information about the [facility] [employer] Card program, using the address provided.  °

## 2013-09-02 NOTE — Discharge Instructions (Signed)

## 2013-09-02 NOTE — ED Provider Notes (Signed)
CSN: [id number]     Arrival date & time 09/02/13  4098 History   First MD Initiated Contact with Patient 09/02/13 [phone]     Chief Complaint  Patient presents with  . Ankle Pain      HPI  Patient slipped on the ice 3-4 days ago. She's not recall she injured her foot or ankle. She is taking at the shelter. She states there allowed to stay indoors all day because of the cold weather, but she did not have to bear much weight. For the last 24 hours she has had to be up and around more and has become more painful she walks with a limp. Complains of pain in the inner aspect of the left foot and ankle.  Past Medical History  Diagnosis Date  . Chronic bronchitis     "get it sometimes twice/yr" (07/25/2013)  . Pelvic fracture     non-surgical mgt.   . Vaginal delivery 1999  . Depression   . COPD (chronic obstructive pulmonary disease)   . Pneumonia 1979; 2004    "double";   Marland Kitchen CAP (community acquired pneumonia) 07/25/2013  . Shortness of breath     "comes on at anytime" (07/25/2013)  . GERD (gastroesophageal reflux disease)   . Hepatitis C 03/2012    diagnosed by labs done at [facility].    . Migraine     "once or twice/month" (07/25/2013)  . Chronic lower back pain   . Anxiety   . Kidney stone     "passed on my own"   Past Surgical History  Procedure Laterality Date  . Cholecystectomy  2009    in [place]  . Tubal ligation  1999  . Vaginal hysterectomy  2001    "partial", for menorrhagia associated with uterine fibroids.   Marland Kitchen Hysteroscopy  1999   History reviewed. No pertinent family history. History  Substance Use Topics  . Smoking status: Current Every Day Smoker -- 0.50 packs/day for 15 years    Types: Cigarettes  . Smokeless tobacco: Never Used  . Alcohol Use: No   OB History   Grav Para Term Preterm Abortions TAB SAB Ect Mult Living                 Review of Systems  Musculoskeletal: Positive for arthralgias, gait problem and myalgias.      Allergies  Levaquin;  Bactrim; Levaquin; and Penicillins  Home Medications   Current Outpatient Rx  Name  Route  Sig  Dispense  Refill  . albuterol (PROVENTIL HFA;VENTOLIN HFA) 108 (90 BASE) MCG/ACT inhaler   Inhalation   Inhale 2 puffs into the lungs every 6 (six) hours as needed for wheezing or shortness of breath.         Marland Kitchen amitriptyline (ELAVIL) 50 MG tablet   Oral   Take 50 mg by mouth at bedtime.         . citalopram (CELEXA) 40 MG tablet   Oral   Take 40 mg by mouth daily.         Marland Kitchen dicyclomine (BENTYL) 20 MG tablet   Oral   Take 1 tablet (20 mg total) by mouth 2 (two) times daily.   20 tablet   0   . HYDROcodone-acetaminophen (NORCO/VICODIN) 5-325 MG per tablet   Oral   Take 2 tablets by mouth every 4 (four) hours as needed.   10 tablet   0   . hydrOXYzine (ATARAX/VISTARIL) 50 MG tablet   Oral   Take 50  mg by mouth 2 (two) times daily.         . ibuprofen (ADVIL,MOTRIN) 400 MG tablet  Marland Kitchen Oral   Take 1 tablet (400 mg total) by mouth every 8 (eight) hours as needed for fever, mild pain or moderate pain.   90 tablet   0   . ipratropium (ATROVENT HFA) 17 MCG/ACT inhaler   Inhalation   Inhale 2 puffs into the lungs every 6 (six) hours.   1 Inhaler   12   . loratadine (CLARITIN) 10 MG tablet   Oral   Take 10 mg by mouth daily.         . mometasone-formoterol (DULERA) 100-5 MCG/ACT AERO   Inhalation   Inhale 2 puffs into the lungs 2 (two) times daily.         . promethazine (PHENERGAN) 25 MG tablet   Oral   Take 25 mg by mouth every 6 (six) hours as needed for nausea or vomiting.         . traMADol (ULTRAM) 50 MG tablet   Oral   Take 1 tablet (50 mg total) by mouth every 6 (six) hours as needed.   10 tablet   0    BP 131/85  Pulse 106  Temp(Src) 98.3 F (36.8 C) (Oral)  Resp 16  SpO2 100% Physical Exam  Musculoskeletal:       Feet:  Pain at the tip of the left medial malleolus, some pain onto the medial forefoot as well. No soft tissue swelling or  ecchymosis. No pain proximally over the fibula or over the lateral ankle at the lateral malleolus or head of the fifth metatarsal.    ED Course  Procedures (including critical care time) Labs Review Labs Reviewed - No data to display Imaging Review Dg Ankle Complete Left  09/02/2013   CLINICAL DATA:  Status post fall 4 days ago.  Left ankle pain.  EXAM: LEFT ANKLE COMPLETE - 3+ VIEW  COMPARISON:  None.  FINDINGS: Imaged bones, joints and soft tissues appear normal.  IMPRESSION: Negative exam.   Electronically Signed   By: Drusilla Kannerhomas  Dalessio M.D.   On: 09/02/2013 09:17   Dg Foot Complete Left  09/02/2013   CLINICAL DATA:  Status post fall 4 days ago.  Left foot pain.  EXAM: LEFT FOOT - COMPLETE 3+ VIEW  COMPARISON:  None.  FINDINGS: Imaged bones, joints and soft tissues appear normal.  IMPRESSION: Negative exam.   Electronically Signed   By: Drusilla Kannerhomas  Dalessio M.D.   On: 09/02/2013 09:16    EKG Interpretation   None       MDM   Final diagnoses:  Ankle sprain    Plan nonweightbearing. Ice, elevation,    Rolland PorterMark Marti Mclane, MD 09/02/13 1013

## 2013-09-02 NOTE — ED Notes (Signed)
Pt c/o L ankle/foot pain x 4 days.  Pain score 8/10.  Pt reports slipping and falling on ice x 4 days ago.

## 2013-09-09 ENCOUNTER — Other Ambulatory Visit: Payer: Self-pay

## 2013-09-30 ENCOUNTER — Ambulatory Visit: Payer: Self-pay

## 2013-10-01 ENCOUNTER — Ambulatory Visit: Payer: Self-pay | Admitting: Internal Medicine

## 2013-10-03 ENCOUNTER — Ambulatory Visit: Payer: No Typology Code available for payment source | Attending: Internal Medicine | Admitting: Internal Medicine

## 2013-10-03 ENCOUNTER — Encounter: Payer: Self-pay | Admitting: Gastroenterology

## 2013-10-03 ENCOUNTER — Encounter: Payer: Self-pay | Admitting: Internal Medicine

## 2013-10-03 VITALS — BP 112/79 | HR 77 | Temp 98.8°F | Resp 16 | Ht 68.5 in | Wt 200.0 lb

## 2013-10-03 DIAGNOSIS — Z79899 Other long term (current) drug therapy: Secondary | ICD-10-CM | POA: Insufficient documentation

## 2013-10-03 DIAGNOSIS — F411 Generalized anxiety disorder: Secondary | ICD-10-CM | POA: Insufficient documentation

## 2013-10-03 DIAGNOSIS — R109 Unspecified abdominal pain: Secondary | ICD-10-CM

## 2013-10-03 DIAGNOSIS — M545 Low back pain, unspecified: Secondary | ICD-10-CM | POA: Insufficient documentation

## 2013-10-03 DIAGNOSIS — F17200 Nicotine dependence, unspecified, uncomplicated: Secondary | ICD-10-CM | POA: Insufficient documentation

## 2013-10-03 DIAGNOSIS — Z888 Allergy status to other drugs, medicaments and biological substances status: Secondary | ICD-10-CM | POA: Insufficient documentation

## 2013-10-03 DIAGNOSIS — R197 Diarrhea, unspecified: Secondary | ICD-10-CM | POA: Insufficient documentation

## 2013-10-03 DIAGNOSIS — J4489 Other specified chronic obstructive pulmonary disease: Secondary | ICD-10-CM | POA: Insufficient documentation

## 2013-10-03 DIAGNOSIS — R319 Hematuria, unspecified: Secondary | ICD-10-CM

## 2013-10-03 DIAGNOSIS — R198 Other specified symptoms and signs involving the digestive system and abdomen: Secondary | ICD-10-CM | POA: Insufficient documentation

## 2013-10-03 DIAGNOSIS — F3289 Other specified depressive episodes: Secondary | ICD-10-CM | POA: Insufficient documentation

## 2013-10-03 DIAGNOSIS — K219 Gastro-esophageal reflux disease without esophagitis: Secondary | ICD-10-CM | POA: Insufficient documentation

## 2013-10-03 DIAGNOSIS — G8929 Other chronic pain: Secondary | ICD-10-CM | POA: Insufficient documentation

## 2013-10-03 DIAGNOSIS — F329 Major depressive disorder, single episode, unspecified: Secondary | ICD-10-CM | POA: Insufficient documentation

## 2013-10-03 DIAGNOSIS — J449 Chronic obstructive pulmonary disease, unspecified: Secondary | ICD-10-CM | POA: Insufficient documentation

## 2013-10-03 DIAGNOSIS — B1920 Unspecified viral hepatitis C without hepatic coma: Secondary | ICD-10-CM | POA: Insufficient documentation

## 2013-10-03 DIAGNOSIS — Z87442 Personal history of urinary calculi: Secondary | ICD-10-CM | POA: Insufficient documentation

## 2013-10-03 DIAGNOSIS — G43909 Migraine, unspecified, not intractable, without status migrainosus: Secondary | ICD-10-CM | POA: Insufficient documentation

## 2013-10-03 DIAGNOSIS — Z880 Allergy status to penicillin: Secondary | ICD-10-CM | POA: Insufficient documentation

## 2013-10-03 DIAGNOSIS — K5900 Constipation, unspecified: Secondary | ICD-10-CM | POA: Insufficient documentation

## 2013-10-03 MED ORDER — METRONIDAZOLE 500 MG PO TABS: 500.0000 mg | ORAL_TABLET | Freq: Three times a day (TID) | ORAL | Status: DC

## 2013-10-03 MED ORDER — CIPROFLOXACIN HCL 500 MG PO TABS: 500.0000 mg | ORAL_TABLET | Freq: Two times a day (BID) | ORAL | Status: DC

## 2013-10-03 NOTE — Progress Notes (Signed)
Pt is here having severe abdomen pain for over a week now. Pt is requesting a to a GI doctor.

## 2013-10-03 NOTE — Patient Instructions (Signed)
Irritable Bowel Syndrome °Irritable Bowel Syndrome (IBS) is caused by a disturbance of normal bowel function. Other terms used are spastic colon, mucous colitis, and irritable colon. It does not require surgery, nor does it lead to cancer. There is no cure for IBS. But with proper diet, stress reduction, and medication, you will find that your problems (symptoms) will gradually disappear or improve. IBS is a common digestive disorder. It usually appears in late adolescence or early adulthood. Women develop it twice as often as men. °CAUSES  °After food has been digested and absorbed in the small intestine, waste material is moved into the colon (large intestine). In the colon, water and salts are absorbed from the undigested products coming from the small intestine. The remaining residue, or fecal material, is held for elimination. Under normal circumstances, gentle, rhythmic contractions on the bowel walls push the fecal material along the colon towards the rectum. In IBS, however, these contractions are irregular and poorly coordinated. The fecal material is either retained too long, resulting in constipation, or expelled too soon, producing diarrhea. °SYMPTOMS  °The most common symptom of IBS is pain. It is typically in the lower left side of the belly (abdomen). But it may occur anywhere in the abdomen. It can be felt as heartburn, backache, or even as a dull pain in the arms or shoulders. The pain comes from excessive bowel-muscle spasms and from the buildup of gas and fecal material in the colon. This pain: °· Can range from sharp belly (abdominal) cramps to a dull, continuous ache. °· Usually worsens soon after eating. °· Is typically relieved by having a bowel movement or passing gas. °Abdominal pain is usually accompanied by constipation. But it may also produce diarrhea. The diarrhea typically occurs right after a meal or upon arising in the morning. The stools are typically soft and watery. They are often  flecked with secretions (mucus). °Other symptoms of IBS include: °· Bloating. °· Loss of appetite. °· Heartburn. °· Feeling sick to your stomach (nausea). °· Belching °· Vomiting °· Gas. °IBS may also cause a number of symptoms that are unrelated to the digestive system: °· Fatigue. °· Headaches. °· Anxiety °· Shortness of breath °· Difficulty in concentrating. °· Dizziness. °These symptoms tend to come and go. °DIAGNOSIS  °The symptoms of IBS closely mimic the symptoms of other, more serious digestive disorders. So your caregiver may wish to perform a variety of additional tests to exclude these disorders. He/she wants to be certain of learning what is wrong (diagnosis). The nature and purpose of each test will be explained to you. °TREATMENT °A number of medications are available to help correct bowel function and/or relieve bowel spasms and abdominal pain. Among the drugs available are: °· Mild, non-irritating laxatives for severe constipation and to help restore normal bowel habits. °· Specific anti-diarrheal medications to treat severe or prolonged diarrhea. °· Anti-spasmodic agents to relieve intestinal cramps. °· Your caregiver may also decide to treat you with a mild tranquilizer or sedative during unusually stressful periods in your life. °The important thing to remember is that if any drug is prescribed for you, make sure that you take it exactly as directed. Make sure that your caregiver knows how well it worked for you. °HOME CARE INSTRUCTIONS  °· Avoid foods that are high in fat or oils. Some examples are:heavy cream, butter, frankfurters, sausage, and other fatty meats. °· Avoid foods that have a laxative effect, such as fruit, fruit juice, and dairy products. °· Cut out   carbonated drinks, chewing gum, and "gassy" foods, such as beans and cabbage. This may help relieve bloating and belching. °· Bran taken with plenty of liquids may help relieve constipation. °· Keep track of what foods seem to trigger  your symptoms. °· Avoid emotionally charged situations or circumstances that produce anxiety. °· Start or continue exercising. °· Get plenty of rest and sleep. °MAKE SURE YOU:  °· Understand these instructions. °· Will watch your condition. °· Will get help right away if you are not doing well or get worse. °Document Released: 06/27/2005 Document Revised: 09/19/2011 Document Reviewed: 02/15/2008 °Exit[employer]® Patient Information ©2014 [employer]. ° °

## 2013-10-03 NOTE — Progress Notes (Signed)
Patient ID: Karen Baldwin, female   DOB: Mar 11, 1971, 43 y.o.   MRN: [id number]   Karen Baldwin, is a 43 y.o. female  [id number]  [id number]  DOB - 28-Jul-1970  Chief Complaint  Patient presents with  . Follow-up        Subjective:   Karen Baldwin is a 43 y.o. female here today for a follow up visit. Patient has history of chronic low back pain, generalized anxiety disorder, major depression, hepatitis C, and COPD. Her major complaint today is low abdominal pain and diarrhea that has been on and off for about 3 months. She was seen for this same symptom before and admitted in the hospital, during hospitalization she had a CT scan on 1/15 was found to have internal extrahepatic biliary dilatation. She was diagnosed with hepatitis C. MRCP on 1/16 showed no evidence of choledocholithiasis, no neoplasm. Initial liver function test was AST of 268 ALT of 154. Since her discharge the patient has had intermittent abdominal pain, states that she has a stool every time she pees. Sometimes she has constipation and sometimes she has diarrhea with mucus. She also has generalized anxiety. She was supposed to be referred to urologist for chronic hematuria of unknown cause but she still waiting for the appointment. She also has shortness of breath which is getting better because she has cut down on her smoking but she continues to smoke, she denies the use of alcohol or other illicit drugs. She has allergy to Levaquin but it was erythema around the IV site not systemic allergy, she's not completely sure of allergy to quinolone antibiotics.  Patient has No headache, No chest pain, No abdominal pain - No Nausea, No new weakness tingling or numbness, No Cough - SOB.  Problem  Abdominal Pain in Female Patient  Alternating Constipation and Diarrhea    ALLERGIES: Allergies  Allergen Reactions  . Levaquin [Levofloxacin] Hives    Patient with pruritis and erythema at IV site after Levofloxacin adminisation  .  Bactrim [Sulfamethoxazole-Trimethoprim] Hives    "skin burning"  . Levaquin [Levofloxacin In D5w] Itching  . Penicillins Other (See Comments)    unknown    PAST MEDICAL HISTORY: Past Medical History  Diagnosis Date  . Chronic bronchitis     "get it sometimes twice/yr" (07/25/2013)  . Pelvic fracture     non-surgical mgt.   . Vaginal delivery 1999  . Depression   . COPD (chronic obstructive pulmonary disease)   . Pneumonia 1979; 2004    "double";   Marland Kitchen CAP (community acquired pneumonia) 07/25/2013  . Shortness of breath     "comes on at anytime" (07/25/2013)  . GERD (gastroesophageal reflux disease)   . Hepatitis C 03/2012    diagnosed by labs done at prison.    . Migraine     "once or twice/month" (07/25/2013)  . Chronic lower back pain   . Anxiety   . Kidney stone     "passed on my own"    MEDICATIONS AT HOME: Prior to Admission medications   Medication Sig Start Date End Date Taking? Authorizing Provider  albuterol (PROVENTIL HFA;VENTOLIN HFA) 108 (90 BASE) MCG/ACT inhaler Inhale 2 puffs into the lungs every 6 (six) hours as needed for wheezing or shortness of breath.   Yes Historical Provider, MD  amitriptyline (ELAVIL) 50 MG tablet Take 50 mg by mouth at bedtime.   Yes Historical Provider, MD  dicyclomine (BENTYL) 20 MG tablet Take 1 tablet (20 mg total) by mouth 2 (two) times  daily. 07/13/13  Yes Mora Bellman, PA-C  ibuprofen (ADVIL,MOTRIN) 400 MG tablet Take 1 tablet (400 mg total) by mouth every 8 (eight) hours as needed for fever, mild pain or moderate pain. 07/28/13  Yes Annett Gula, MD  ipratropium (ATROVENT HFA) 17 MCG/ACT inhaler Inhale 2 puffs into the lungs every 6 (six) hours. 07/29/13  Yes Annett Gula, MD  loratadine (CLARITIN) 10 MG tablet Take 10 mg by mouth daily.   Yes Historical Provider, MD  mometasone-formoterol (DULERA) 100-5 MCG/ACT AERO Inhale 2 puffs into the lungs 2 (two) times daily.   Yes Historical Provider, MD  ciprofloxacin (CIPRO) 500 MG  tablet Take 1 tablet (500 mg total) by mouth 2 (two) times daily. 10/03/13   Jeanann Lewandowsky, MD  citalopram (CELEXA) 40 MG tablet Take 40 mg by mouth daily.    Historical Provider, MD  HYDROcodone-acetaminophen (NORCO/VICODIN) 5-325 MG per tablet Take 2 tablets by mouth every 4 (four) hours as needed. 09/02/13   Rolland Porter, MD  hydrOXYzine (ATARAX/VISTARIL) 50 MG tablet Take 50 mg by mouth 2 (two) times daily.    Historical Provider, MD  metroNIDAZOLE (FLAGYL) 500 MG tablet Take 1 tablet (500 mg total) by mouth 3 (three) times daily. 10/03/13   Jeanann Lewandowsky, MD  traMADol (ULTRAM) 50 MG tablet Take 1 tablet (50 mg total) by mouth every 6 (six) hours as needed. 09/02/13   Rolland Porter, MD     Objective:   Filed Vitals:   10/03/13 0903  BP: 112/79  Pulse: 77  Temp: 98.8 F (37.1 C)  TempSrc: Oral  Resp: 16  Height: 5' 8.5" (1.74 m)  Weight: 200 lb (90.719 kg)  SpO2: 99%    Exam General appearance : Awake, alert, not in any distress. Speech Clear. Not toxic looking HEENT: Atraumatic and Normocephalic, pupils equally reactive to light and accomodation Neck: supple, no JVD. No cervical lymphadenopathy.  Chest:Good air entry bilaterally, no added sounds  CVS: S1 S2 regular, no murmurs.  Abdomen: Bowel sounds present, Non tender and not distended with no gaurding, rigidity or rebound. Extremities: B/L Lower Ext shows no edema, both legs are warm to touch Neurology: Awake alert, and oriented X 3, CN II-XII intact, Non focal Skin:No Rash Wounds:N/A  Data Review No results found for this basename: HGBA1C     Assessment & Plan   1. Abdominal pain in female patient Patient claims she had used ciprofloxacin in the past for UTI and would like to try when offered. Prescribed: - ciprofloxacin (CIPRO) 500 MG tablet; Take 1 tablet (500 mg total) by mouth 2 (two) times daily.  Dispense: 10 tablet; Refill: 0 - metroNIDAZOLE (FLAGYL) 500 MG tablet; Take 1 tablet (500 mg total) by mouth 3  (three) times daily.  Dispense: 15 tablet; Refill: 0  Stool study - Giardia, EIA; Ova/Parasite - C difficile Toxins A+B W/Rflx - Fecal leukocytes  2. Alternating constipation and diarrhea I think this may be irritable bowel syndrome, but because of a recent diagnosis of sigmoid colitis, patient needs a colonoscopy so referral to gastroenterologist is needed  - Ambulatory referral to Gastroenterology  3. Hematuria  - Ambulatory referral to Urology  Patient was extensively counseled on nutrition and exercise Patient was counseled extensively on smoking cessation  Return in about 3 months (around 01/03/2014), or if symptoms worsen or fail to improve, for Follow up Pain and comorbidities.  The patient was given clear instructions to go to ER or return to medical center if symptoms don't improve,  worsen or new problems develop. The patient verbalized understanding. The patient was told to call to get lab results if they haven't heard anything in the next week.   This note has been created with Education officer, environmentalDragon speech recognition software and smart phrase technology. Any transcriptional errors are unintentional.    Jeanann LewandowskyJEGEDE, Safal Halderman, MD, MHA, FACP, FAAP [facility] [place], [place] [phone]   10/03/2013, 9:46 AM

## 2013-10-04 ENCOUNTER — Other Ambulatory Visit: Payer: Self-pay | Admitting: Family Medicine

## 2013-10-05 LAB — FECAL LACTOFERRIN, QUANT: LACTOFERRIN: NEGATIVE

## 2013-10-06 ENCOUNTER — Encounter (HOSPITAL_COMMUNITY): Payer: Self-pay | Admitting: Emergency Medicine

## 2013-10-06 ENCOUNTER — Emergency Department (HOSPITAL_COMMUNITY)
Admission: EM | Admit: 2013-10-06 | Discharge: 2013-10-06 | Disposition: A | Discharge status: Home / Self Care | Payer: No Typology Code available for payment source | Attending: Emergency Medicine | Admitting: Emergency Medicine

## 2013-10-06 ENCOUNTER — Emergency Department (HOSPITAL_COMMUNITY): Payer: No Typology Code available for payment source

## 2013-10-06 DIAGNOSIS — Z5900 Homelessness unspecified: Secondary | ICD-10-CM | POA: Insufficient documentation

## 2013-10-06 DIAGNOSIS — F329 Major depressive disorder, single episode, unspecified: Secondary | ICD-10-CM | POA: Insufficient documentation

## 2013-10-06 DIAGNOSIS — Z8701 Personal history of pneumonia (recurrent): Secondary | ICD-10-CM | POA: Insufficient documentation

## 2013-10-06 DIAGNOSIS — Z880 Allergy status to penicillin: Secondary | ICD-10-CM | POA: Insufficient documentation

## 2013-10-06 DIAGNOSIS — F411 Generalized anxiety disorder: Secondary | ICD-10-CM | POA: Insufficient documentation

## 2013-10-06 DIAGNOSIS — F3289 Other specified depressive episodes: Secondary | ICD-10-CM | POA: Insufficient documentation

## 2013-10-06 DIAGNOSIS — F17200 Nicotine dependence, unspecified, uncomplicated: Secondary | ICD-10-CM | POA: Insufficient documentation

## 2013-10-06 DIAGNOSIS — J449 Chronic obstructive pulmonary disease, unspecified: Secondary | ICD-10-CM | POA: Insufficient documentation

## 2013-10-06 DIAGNOSIS — Z8619 Personal history of other infectious and parasitic diseases: Secondary | ICD-10-CM | POA: Insufficient documentation

## 2013-10-06 DIAGNOSIS — J4489 Other specified chronic obstructive pulmonary disease: Secondary | ICD-10-CM | POA: Insufficient documentation

## 2013-10-06 DIAGNOSIS — R7989 Other specified abnormal findings of blood chemistry: Secondary | ICD-10-CM | POA: Insufficient documentation

## 2013-10-06 DIAGNOSIS — Z87442 Personal history of urinary calculi: Secondary | ICD-10-CM | POA: Insufficient documentation

## 2013-10-06 DIAGNOSIS — Z9089 Acquired absence of other organs: Secondary | ICD-10-CM | POA: Insufficient documentation

## 2013-10-06 DIAGNOSIS — R319 Hematuria, unspecified: Secondary | ICD-10-CM | POA: Insufficient documentation

## 2013-10-06 DIAGNOSIS — R945 Abnormal results of liver function studies: Secondary | ICD-10-CM

## 2013-10-06 DIAGNOSIS — G8929 Other chronic pain: Secondary | ICD-10-CM | POA: Insufficient documentation

## 2013-10-06 DIAGNOSIS — Z9851 Tubal ligation status: Secondary | ICD-10-CM | POA: Insufficient documentation

## 2013-10-06 DIAGNOSIS — Z8709 Personal history of other diseases of the respiratory system: Secondary | ICD-10-CM | POA: Insufficient documentation

## 2013-10-06 DIAGNOSIS — Z8719 Personal history of other diseases of the digestive system: Secondary | ICD-10-CM | POA: Insufficient documentation

## 2013-10-06 DIAGNOSIS — Z8781 Personal history of (healed) traumatic fracture: Secondary | ICD-10-CM | POA: Insufficient documentation

## 2013-10-06 DIAGNOSIS — R197 Diarrhea, unspecified: Secondary | ICD-10-CM | POA: Insufficient documentation

## 2013-10-06 DIAGNOSIS — Z3202 Encounter for pregnancy test, result negative: Secondary | ICD-10-CM | POA: Insufficient documentation

## 2013-10-06 DIAGNOSIS — Z79899 Other long term (current) drug therapy: Secondary | ICD-10-CM | POA: Insufficient documentation

## 2013-10-06 DIAGNOSIS — Z792 Long term (current) use of antibiotics: Secondary | ICD-10-CM | POA: Insufficient documentation

## 2013-10-06 DIAGNOSIS — IMO0002 Reserved for concepts with insufficient information to code with codable children: Secondary | ICD-10-CM | POA: Insufficient documentation

## 2013-10-06 DIAGNOSIS — R109 Unspecified abdominal pain: Secondary | ICD-10-CM | POA: Insufficient documentation

## 2013-10-06 DIAGNOSIS — Z90710 Acquired absence of both cervix and uterus: Secondary | ICD-10-CM | POA: Insufficient documentation

## 2013-10-06 DIAGNOSIS — G43909 Migraine, unspecified, not intractable, without status migrainosus: Secondary | ICD-10-CM | POA: Insufficient documentation

## 2013-10-06 LAB — URINALYSIS, ROUTINE W REFLEX MICROSCOPIC
Bilirubin Urine: NEGATIVE
Glucose, UA: NEGATIVE mg/dL
KETONES UR: NEGATIVE mg/dL
Leukocytes, UA: NEGATIVE
NITRITE: NEGATIVE
Protein, ur: NEGATIVE mg/dL
Specific Gravity, Urine: 1.017 (ref 1.005–1.030)
UROBILINOGEN UA: 0.2 mg/dL (ref 0.0–1.0)
pH: 6.5 (ref 5.0–8.0)

## 2013-10-06 LAB — CBC WITH DIFFERENTIAL/PLATELET
Basophils Absolute: 0 10*3/uL (ref 0.0–0.1)
Basophils Relative: 1 % (ref 0–1)
Eosinophils Absolute: 0.2 10*3/uL (ref 0.0–0.7)
Eosinophils Relative: 4 % (ref 0–5)
HCT: 43.2 % (ref 36.0–46.0)
HEMOGLOBIN: 15 g/dL (ref 12.0–15.0)
LYMPHS ABS: 1.8 10*3/uL (ref 0.7–4.0)
Lymphocytes Relative: 38 % (ref 12–46)
MCH: 31.8 pg (ref 26.0–34.0)
MCHC: 34.7 g/dL (ref 30.0–36.0)
MCV: 91.5 fL (ref 78.0–100.0)
MONO ABS: 0.4 10*3/uL (ref 0.1–1.0)
MONOS PCT: 7 % (ref 3–12)
NEUTROS ABS: 2.4 10*3/uL (ref 1.7–7.7)
Neutrophils Relative %: 51 % (ref 43–77)
Platelets: 265 10*3/uL (ref 150–400)
RBC: 4.72 MIL/uL (ref 3.87–5.11)
RDW: 12.9 % (ref 11.5–15.5)
WBC: 4.8 10*3/uL (ref 4.0–10.5)

## 2013-10-06 LAB — COMPREHENSIVE METABOLIC PANEL
ALK PHOS: 86 U/L (ref 39–117)
ALT: 141 U/L — ABNORMAL HIGH (ref 0–35)
AST: 97 U/L — ABNORMAL HIGH (ref 0–37)
Albumin: 4.7 g/dL (ref 3.5–5.2)
BUN: 6 mg/dL (ref 6–23)
CO2: 24 meq/L (ref 19–32)
CREATININE: 0.64 mg/dL (ref 0.50–1.10)
Calcium: 10.4 mg/dL (ref 8.4–10.5)
Chloride: 104 mEq/L (ref 96–112)
GFR calc Af Amer: >90 mL/min (ref >90)
GLUCOSE: 94 mg/dL (ref 70–99)
POTASSIUM: 3.2 meq/L — AB (ref 3.7–5.3)
Sodium: 143 mEq/L (ref 137–147)
Total Bilirubin: 0.4 mg/dL (ref 0.3–1.2)
Total Protein: 8.9 g/dL — ABNORMAL HIGH (ref 6.0–8.3)

## 2013-10-06 LAB — URINE MICROSCOPIC-ADD ON

## 2013-10-06 LAB — LIPASE, BLOOD: LIPASE: 23 U/L (ref 11–59)

## 2013-10-06 LAB — POC URINE PREG, ED: PREG TEST UR: NEGATIVE

## 2013-10-06 MED ORDER — POTASSIUM CHLORIDE CRYS ER 10 MEQ PO TBCR
30.0000 meq | EXTENDED_RELEASE_TABLET | Freq: Once | ORAL | Status: AC
  Administered 2013-10-06: 30 meq via ORAL
  Filled 2013-10-06: qty 3

## 2013-10-06 MED ORDER — MORPHINE SULFATE 4 MG/ML IJ SOLN
4.0000 mg | Freq: Once | INTRAMUSCULAR | Status: DC
  Filled 2013-10-06: qty 1

## 2013-10-06 MED ORDER — PROMETHAZINE HCL 25 MG PO TABS: 25.0000 mg | ORAL_TABLET | Freq: Four times a day (QID) | ORAL | Status: DC | PRN

## 2013-10-06 MED ORDER — SODIUM CHLORIDE 0.9 % IV BOLUS (SEPSIS): 1000.0000 mL | Freq: Once | INTRAVENOUS | Status: DC

## 2013-10-06 MED ORDER — DICYCLOMINE HCL 10 MG PO CAPS
10.0000 mg | ORAL_CAPSULE | Freq: Once | ORAL | Status: AC
  Administered 2013-10-06: 10 mg via ORAL
  Filled 2013-10-06: qty 1

## 2013-10-06 MED ORDER — DICYCLOMINE HCL 20 MG PO TABS: 20.0000 mg | ORAL_TABLET | Freq: Two times a day (BID) | ORAL | Status: DC

## 2013-10-06 MED ORDER — OXYCODONE-ACETAMINOPHEN 5-325 MG PO TABS
2.0000 | ORAL_TABLET | Freq: Once | ORAL | Status: AC
Start: 2013-10-06 — End: 2013-10-06
  Administered 2013-10-06: 2 via ORAL
  Filled 2013-10-06: qty 2

## 2013-10-06 MED ORDER — MORPHINE SULFATE 4 MG/ML IJ SOLN
4.0000 mg | Freq: Once | INTRAMUSCULAR | Status: AC
  Administered 2013-10-06: 4 mg via INTRAMUSCULAR
  Filled 2013-10-06: qty 1

## 2013-10-06 NOTE — ED Provider Notes (Signed)
CSN: [id number]     Arrival date & time 10/06/13  1254 History   First MD Initiated Contact with Patient 10/06/13 1312     Chief Complaint  Patient presents with  . Abdominal Pain  . Diarrhea     (Consider location/radiation/quality/duration/timing/severity/associated sxs/prior Treatment) HPI  The patient presents to the ER with complaints of abdominal cramping and diarrhea. This is not a new problem for the patient and she has a specialist appointment pending for May. She started having this pain while in jail (for over a year) and was released late 2014. She normally takes bentyl for this but has run out and the [facility] will not be able to fill it until Friday. She is currently homeless and lives in a shelter. She has had a thorough work - up including a CT scan for this. She had a hysterectomy in 2001 and has not had any vaginal bleeding or abnormal discharge. She denies dysuria or hematuria.   Past Medical History  Diagnosis Date  . Chronic bronchitis     "get it sometimes twice/yr" (07/25/2013)  . Pelvic fracture     non-surgical mgt.   . Vaginal delivery 1999  . Depression   . COPD (chronic obstructive pulmonary disease)   . Pneumonia 1979; 2004    "double";   Marland Kitchen CAP (community acquired pneumonia) 07/25/2013  . Shortness of breath     "comes on at anytime" (07/25/2013)  . GERD (gastroesophageal reflux disease)   . Hepatitis C 03/2012    diagnosed by labs done at prison.    . Migraine     "once or twice/month" (07/25/2013)  . Chronic lower back pain   . Anxiety   . Kidney stone     "passed on my own"   Past Surgical History  Procedure Laterality Date  . Cholecystectomy  2009    in [place]  . Tubal ligation  1999  . Vaginal hysterectomy  2001    "partial", for menorrhagia associated with uterine fibroids.   Marland Kitchen Hysteroscopy  1999   History reviewed. No pertinent family history. History  Substance Use Topics  . Smoking status: Current Every Day  Smoker -- 0.50 packs/day for 15 years    Types: Cigarettes  . Smokeless tobacco: Never Used  . Alcohol Use: No   OB History   Grav Para Term Preterm Abortions TAB SAB Ect Mult Living                 Review of Systems  The patient denies anorexia, fever, weight loss, vision loss, decreased hearing, hoarseness, chest pain, syncope, dyspnea on exertion, peripheral edema, balance deficits, hemoptysis, melena, hematochezia, severe indigestion/heartburn, hematuria, incontinence, genital sores, muscle weakness, suspicious skin lesions, transient blindness, difficulty walking, depression, unusual weight change, abnormal bleeding, enlarged lymph nodes, angioedema, and breast masses.   Allergies  Levaquin; Bactrim; Levaquin; and Penicillins  Home Medications   Current Outpatient Rx  Name  Route  Sig  Dispense  Refill  . albuterol (PROVENTIL HFA;VENTOLIN HFA) 108 (90 BASE) MCG/ACT inhaler   Inhalation   Inhale 2 puffs into the lungs every 6 (six) hours as needed for wheezing or shortness of breath.         Marland Kitchen amitriptyline (ELAVIL) 50 MG tablet   Oral   Take 50 mg by mouth at bedtime.         . ciprofloxacin (CIPRO) 500 MG tablet   Oral   Take 1 tablet (500 mg total) by mouth 2 (  two) times daily.   10 tablet   0   . dicyclomine (BENTYL) 20 MG tablet   Oral   Take 1 tablet (20 mg total) by mouth 2 (two) times daily.   20 tablet   0   . ibuprofen (ADVIL,MOTRIN) 400 MG tablet   Oral   Take 1 tablet (400 mg total) by mouth every 8 (eight) hours as needed for fever, mild pain or moderate pain.   90 tablet   0   . ipratropium (ATROVENT HFA) 17 MCG/ACT inhaler   Inhalation   Inhale 2 puffs into the lungs every 6 (six) hours.   1 Inhaler   12   . loratadine (CLARITIN) 10 MG tablet   Oral   Take 10 mg by mouth daily.         . metroNIDAZOLE (FLAGYL) 500 MG tablet   Oral   Take 1 tablet (500 mg total) by mouth 3 (three) times daily.   15 tablet   0   .  mometasone-formoterol (DULERA) 100-5 MCG/ACT AERO   Inhalation   Inhale 2 puffs into the lungs 2 (two) times daily.          BP 138/84  Pulse 100  Temp(Src) 98.7 F (37.1 C) (Oral)  Resp 18  Wt 200 lb (90.719 kg)  SpO2 100% Physical Exam  Nursing note and vitals reviewed. Constitutional: She appears well-developed and well-nourished. No distress.  HENT:  Head: Normocephalic and atraumatic.  Eyes: Pupils are equal, round, and reactive to light.  Neck: Normal range of motion. Neck supple.  Cardiovascular: Normal rate and regular rhythm.   Pulmonary/Chest: Effort normal.  Abdominal: Soft.  Neurological: She is alert.  Skin: Skin is warm and dry.    ED Course  Procedures (including critical care time) Labs Review Labs Reviewed  COMPREHENSIVE METABOLIC PANEL - Abnormal; Notable for the following:    Potassium 3.2 (*)    Total Protein 8.9 (*)    AST 97 (*)    ALT 141 (*)    All other components within normal limits  URINALYSIS, ROUTINE W REFLEX MICROSCOPIC - Abnormal; Notable for the following:    APPearance CLOUDY (*)    Hgb urine dipstick MODERATE (*)    All other components within normal limits  URINE MICROSCOPIC-ADD ON - Abnormal; Notable for the following:    Squamous Epithelial / LPF FEW (*)    All other components within normal limits  CBC WITH DIFFERENTIAL  LIPASE, BLOOD  POC URINE PREG, ED   Imaging Review Dg Abd 2 Views  10/06/2013   CLINICAL DATA:  Abdominal pain and diarrhea.  EXAM: ABDOMEN - 2 VIEW  COMPARISON:  08/12/2013  FINDINGS: Negative for free air on the upright view. Cholecystectomy clips in the right upper quadrant. Nonspecific bowel gas pattern with gas in the small and large bowel. Most of the bowel gas is in the colon. Small stool burden in the abdomen.  IMPRESSION: Nonspecific bowel gas pattern.   Electronically Signed   By: Richarda OverlieAdam  Henn M.D.   On: 10/06/2013 14:34     EKG Interpretation None      MDM   Final diagnoses:  Hematuria     Pt has blood in her urine, no longer has menstrual cycles after hysterectomy, will need referral to Urology.  Blood work is reassuring, WBC is WNL. Will get plan films of the abdomen and treat her pain.  2:39pm- IV pain  Medications and treatments ordered but nurse never gave them. Labs  have all returned and blood work is WNL aside from elevated LFTs which is chronic due to hepatitis. The patient dies have hemoglboin in her urine as discussed previously.  Will give oral Bentyl and PO Percocet for pain.  Will Rx Bentyl and Phenergan  Pt to follow-up at appointment with GI in one month and get her medications refilled on Friday. Referral to infectious disease given for her elevated LFTs/Hepatitis Referral to urology given for her hematuria The patient just got insurance so she says she will have no problem seeing these providers.  43 y.o.Karen Baldwin's evaluation in the Emergency Department is complete. It has been determined that no acute conditions requiring further emergency intervention are present at this time. The patient/guardian have been advised of the diagnosis and plan. We have discussed signs and symptoms that warrant return to the ED, such as changes or worsening in symptoms.  Vital signs are stable at discharge. Filed Vitals:   10/06/13 1309  BP: 138/84  Pulse: 100  Temp: 98.7 F (37.1 C)  Resp: 18    Patient/guardian has voiced understanding and agreed to follow-up with the PCP or specialist.   Dorthula Matas, PA-C 10/06/13 1536

## 2013-10-06 NOTE — Discharge Instructions (Signed)
Diarrhea Diarrhea is frequent loose and watery bowel movements. It can cause you to feel weak and dehydrated. Dehydration can cause you to become tired and thirsty, have a dry mouth, and have decreased urination that often is dark yellow. Diarrhea is a sign of another problem, most often an infection that will not last long. In most cases, diarrhea typically lasts 2 3 days. However, it can last longer if it is a sign of something more serious. It is important to treat your diarrhea as directed by your caregive to lessen or prevent future episodes of diarrhea. CAUSES  Some common causes include:  Gastrointestinal infections caused by viruses, bacteria, or parasites.  Food poisoning or food allergies.  Certain medicines, such as antibiotics, chemotherapy, and laxatives.  Artificial sweeteners and fructose.  Digestive disorders. HOME CARE INSTRUCTIONS  Ensure adequate fluid intake (hydration): have 1 cup (8 oz) of fluid for each diarrhea episode. Avoid fluids that contain simple sugars or sports drinks, fruit juices, whole milk products, and sodas. Your urine should be clear or pale yellow if you are drinking enough fluids. Hydrate with an oral rehydration solution that you can purchase at pharmacies, retail stores, and online. You can prepare an oral rehydration solution at home by mixing the following ingredients together:    tsp table salt.   tsp baking soda.   tsp salt substitute containing potassium chloride.  1  tablespoons sugar.  1 L (34 oz) of water.  Certain foods and beverages may increase the speed at which food moves through the gastrointestinal (GI) tract. These foods and beverages should be avoided and include:  Caffeinated and alcoholic beverages.  High-fiber foods, such as raw fruits and vegetables, nuts, seeds, and whole grain breads and cereals.  Foods and beverages sweetened with sugar alcohols, such as xylitol, sorbitol, and mannitol.  Some foods may be well  tolerated and may help thicken stool including:  Starchy foods, such as rice, toast, pasta, low-sugar cereal, oatmeal, grits, baked potatoes, crackers, and bagels.  Bananas.  Applesauce.  Add probiotic-rich foods to help increase healthy bacteria in the GI tract, such as yogurt and fermented milk products.  Wash your hands well after each diarrhea episode.  Only take over-the-counter or prescription medicines as directed by your caregiver.  Take a warm bath to relieve any burning or pain from frequent diarrhea episodes. SEEK IMMEDIATE MEDICAL CARE IF:   You are unable to keep fluids down.  You have persistent vomiting.  You have blood in your stool, or your stools are black and tarry.  You do not urinate in 6 8 hours, or there is only a small amount of very dark urine.  You have abdominal pain that increases or localizes.  You have weakness, dizziness, confusion, or lightheadedness.  You have a severe headache.  Your diarrhea gets worse or does not get better.  You have a fever or persistent symptoms for more than 2 3 days.  You have a fever and your symptoms suddenly get worse. MAKE SURE YOU:   Understand these instructions.  Will watch your condition.  Will get help right away if you are not doing well or get worse. Document Released: 06/17/2002 Document Revised: 06/13/2012 Document Reviewed: 03/04/2012 [facility] Patient Information 2014 [place], [place].  Hematuria, Adult Hematuria is blood in your urine. It can be caused by a bladder infection, kidney infection, prostate infection, kidney stone, or cancer of your urinary tract. Infections can usually be treated with medicine, and a kidney stone usually will pass  through your urine. If neither of these is the cause of your hematuria, further workup to find out the reason may be needed. It is very important that you tell your health care provider about any blood you see in your urine, even if the blood stops without  treatment or happens without causing pain. Blood in your urine that happens and then stops and then happens again can be a symptom of a very serious condition. Also, pain is not a symptom in the initial stages of many urinary cancers. HOME CARE INSTRUCTIONS   Drink lots of fluid, 3 4 quarts a day. If you have been diagnosed with an infection, cranberry juice is especially recommended, in addition to large amounts of water.  Avoid caffeine, tea, and carbonated beverages, because they tend to irritate the bladder.  Avoid alcohol because it may irritate the prostate.  Only take over-the-counter or prescription medicines for pain, discomfort, or fever as directed by your health care provider.  If you have been diagnosed with a kidney stone, follow your health care provider's instructions regarding straining your urine to catch the stone.  Empty your bladder often. Avoid holding urine for long periods of time.  After a bowel movement, women should cleanse front to back. Use each tissue only once.  Empty your bladder before and after sexual intercourse if you are a female. SEEK MEDICAL CARE IF: You develop back pain, fever, a feeling of sickness in your stomach (nausea), or vomiting or if your symptoms are not better in 3 days. Return sooner if you are getting worse. SEEK IMMEDIATE MEDICAL CARE IF:   You have a persistent fever, with a temperature of 101.26F (38.8C) or greater.  You develop severe vomiting and are unable to keep the medicine down.  You develop severe back or abdominal pain despite taking your medicines.  You begin passing a large amount of blood or clots in your urine.  You feel extremely weak or faint, or you pass out. MAKE SURE YOU:   Understand these instructions.  Will watch your condition.  Will get help right away if you are not doing well or get worse. Document Released: 06/27/2005 Document Revised: 04/17/2013 Document Reviewed: 02/25/2013 [facility] Patient  Information 2014 [place], [place].

## 2013-10-06 NOTE — ED Notes (Signed)
Patient reports that she is having Nausea and having loose stools. She is unable to eat anything. The patient reports that she is having abdominal cramping

## 2013-10-06 NOTE — ED Notes (Addendum)
States that she has been having abd pain for the past year. [facility] the same and was given Flagyl and Cipro. States that she will be picking up the meds today. Also states that she has periods of hard formed stools and then episodes of diarrhea with mucus. Also states that she has abd spasms. States that she will see a GI specialist in May.

## 2013-10-07 LAB — GIARDIA ANTIGEN: GIARDIA SCREEN (EIA): NEGATIVE

## 2013-10-07 LAB — OVA AND PARASITE EXAMINATION: OP: NONE SEEN

## 2013-10-09 ENCOUNTER — Encounter (HOSPITAL_COMMUNITY): Payer: Self-pay | Admitting: Emergency Medicine

## 2013-10-09 ENCOUNTER — Emergency Department (HOSPITAL_COMMUNITY)
Admission: EM | Admit: 2013-10-09 | Discharge: 2013-10-09 | Disposition: A | Discharge status: Home / Self Care | Payer: No Typology Code available for payment source | Attending: Emergency Medicine | Admitting: Emergency Medicine

## 2013-10-09 ENCOUNTER — Emergency Department (HOSPITAL_COMMUNITY): Payer: No Typology Code available for payment source

## 2013-10-09 DIAGNOSIS — Z90710 Acquired absence of both cervix and uterus: Secondary | ICD-10-CM | POA: Insufficient documentation

## 2013-10-09 DIAGNOSIS — Z9851 Tubal ligation status: Secondary | ICD-10-CM | POA: Insufficient documentation

## 2013-10-09 DIAGNOSIS — Z79899 Other long term (current) drug therapy: Secondary | ICD-10-CM | POA: Insufficient documentation

## 2013-10-09 DIAGNOSIS — IMO0002 Reserved for concepts with insufficient information to code with codable children: Secondary | ICD-10-CM | POA: Insufficient documentation

## 2013-10-09 DIAGNOSIS — R197 Diarrhea, unspecified: Secondary | ICD-10-CM | POA: Insufficient documentation

## 2013-10-09 DIAGNOSIS — F411 Generalized anxiety disorder: Secondary | ICD-10-CM | POA: Insufficient documentation

## 2013-10-09 DIAGNOSIS — R109 Unspecified abdominal pain: Secondary | ICD-10-CM

## 2013-10-09 DIAGNOSIS — J4489 Other specified chronic obstructive pulmonary disease: Secondary | ICD-10-CM | POA: Insufficient documentation

## 2013-10-09 DIAGNOSIS — Z8781 Personal history of (healed) traumatic fracture: Secondary | ICD-10-CM | POA: Insufficient documentation

## 2013-10-09 DIAGNOSIS — F3289 Other specified depressive episodes: Secondary | ICD-10-CM | POA: Insufficient documentation

## 2013-10-09 DIAGNOSIS — F17200 Nicotine dependence, unspecified, uncomplicated: Secondary | ICD-10-CM | POA: Insufficient documentation

## 2013-10-09 DIAGNOSIS — Z9089 Acquired absence of other organs: Secondary | ICD-10-CM | POA: Insufficient documentation

## 2013-10-09 DIAGNOSIS — R1011 Right upper quadrant pain: Secondary | ICD-10-CM | POA: Insufficient documentation

## 2013-10-09 DIAGNOSIS — R350 Frequency of micturition: Secondary | ICD-10-CM | POA: Insufficient documentation

## 2013-10-09 DIAGNOSIS — Z880 Allergy status to penicillin: Secondary | ICD-10-CM | POA: Insufficient documentation

## 2013-10-09 DIAGNOSIS — G8929 Other chronic pain: Secondary | ICD-10-CM | POA: Insufficient documentation

## 2013-10-09 DIAGNOSIS — R1013 Epigastric pain: Secondary | ICD-10-CM | POA: Insufficient documentation

## 2013-10-09 DIAGNOSIS — Z87442 Personal history of urinary calculi: Secondary | ICD-10-CM | POA: Insufficient documentation

## 2013-10-09 DIAGNOSIS — Z8719 Personal history of other diseases of the digestive system: Secondary | ICD-10-CM | POA: Insufficient documentation

## 2013-10-09 DIAGNOSIS — R110 Nausea: Secondary | ICD-10-CM | POA: Insufficient documentation

## 2013-10-09 DIAGNOSIS — Z8701 Personal history of pneumonia (recurrent): Secondary | ICD-10-CM | POA: Insufficient documentation

## 2013-10-09 DIAGNOSIS — Z8679 Personal history of other diseases of the circulatory system: Secondary | ICD-10-CM | POA: Insufficient documentation

## 2013-10-09 DIAGNOSIS — J449 Chronic obstructive pulmonary disease, unspecified: Secondary | ICD-10-CM | POA: Insufficient documentation

## 2013-10-09 DIAGNOSIS — F329 Major depressive disorder, single episode, unspecified: Secondary | ICD-10-CM | POA: Insufficient documentation

## 2013-10-09 DIAGNOSIS — Z8619 Personal history of other infectious and parasitic diseases: Secondary | ICD-10-CM | POA: Insufficient documentation

## 2013-10-09 LAB — CBC
HEMATOCRIT: 37.3 % (ref 36.0–46.0)
Hemoglobin: 12.8 g/dL (ref 12.0–15.0)
MCH: 31.7 pg (ref 26.0–34.0)
MCHC: 34.3 g/dL (ref 30.0–36.0)
MCV: 92.3 fL (ref 78.0–100.0)
Platelets: 240 10*3/uL (ref 150–400)
RBC: 4.04 MIL/uL (ref 3.87–5.11)
RDW: 12.9 % (ref 11.5–15.5)
WBC: 4.8 10*3/uL (ref 4.0–10.5)

## 2013-10-09 LAB — COMPREHENSIVE METABOLIC PANEL
ALBUMIN: 4.5 g/dL (ref 3.5–5.2)
ALK PHOS: 86 U/L (ref 39–117)
ALT: 84 U/L — ABNORMAL HIGH (ref 0–35)
AST: 51 U/L — ABNORMAL HIGH (ref 0–37)
BUN: 13 mg/dL (ref 6–23)
CO2: 26 mEq/L (ref 19–32)
CREATININE: 0.74 mg/dL (ref 0.50–1.10)
Calcium: 9.7 mg/dL (ref 8.4–10.5)
Chloride: 104 mEq/L (ref 96–112)
GFR calc Af Amer: >90 mL/min (ref >90)
GFR calc non Af Amer: >90 mL/min (ref >90)
Glucose, Bld: 91 mg/dL (ref 70–99)
POTASSIUM: 4.1 meq/L (ref 3.7–5.3)
Sodium: 140 mEq/L (ref 137–147)
TOTAL PROTEIN: 7.7 g/dL (ref 6.0–8.3)
Total Bilirubin: 0.3 mg/dL (ref 0.3–1.2)

## 2013-10-09 LAB — URINALYSIS, ROUTINE W REFLEX MICROSCOPIC
BILIRUBIN URINE: NEGATIVE
Glucose, UA: NEGATIVE mg/dL
Ketones, ur: NEGATIVE mg/dL
Leukocytes, UA: NEGATIVE
NITRITE: NEGATIVE
Protein, ur: NEGATIVE mg/dL
SPECIFIC GRAVITY, URINE: 1.006 (ref 1.005–1.030)
UROBILINOGEN UA: 0.2 mg/dL (ref 0.0–1.0)
pH: 7 (ref 5.0–8.0)

## 2013-10-09 LAB — URINE MICROSCOPIC-ADD ON

## 2013-10-09 LAB — LIPASE, BLOOD: Lipase: 23 U/L (ref 11–59)

## 2013-10-09 MED ORDER — IOHEXOL 300 MG/ML  SOLN
50.0000 mL | Freq: Once | INTRAMUSCULAR | Status: AC | PRN
  Administered 2013-10-09: 50 mL via ORAL

## 2013-10-09 MED ORDER — KETOROLAC TROMETHAMINE 30 MG/ML IJ SOLN
30.0000 mg | Freq: Once | INTRAMUSCULAR | Status: AC
  Administered 2013-10-09: 30 mg via INTRAVENOUS
  Filled 2013-10-09: qty 1

## 2013-10-09 MED ORDER — MORPHINE SULFATE 4 MG/ML IJ SOLN
4.0000 mg | Freq: Once | INTRAMUSCULAR | Status: AC
  Administered 2013-10-09: 4 mg via INTRAMUSCULAR
  Filled 2013-10-09: qty 1

## 2013-10-09 MED ORDER — ONDANSETRON HCL 4 MG/2ML IJ SOLN
4.0000 mg | Freq: Once | INTRAMUSCULAR | Status: AC
  Administered 2013-10-09: 4 mg via INTRAVENOUS
  Filled 2013-10-09: qty 2

## 2013-10-09 MED ORDER — SODIUM CHLORIDE 0.9 % IV BOLUS (SEPSIS)
1000.0000 mL | Freq: Once | INTRAVENOUS | Status: AC
  Administered 2013-10-09: 1000 mL via INTRAVENOUS

## 2013-10-09 NOTE — Discharge Instructions (Signed)
Continue bentyl and phenergan as needed for your symptoms.  Abdominal Pain, Adult Many things can cause abdominal pain. Usually, abdominal pain is not caused by a disease and will improve without treatment. It can often be observed and treated at home. Your health care provider will do a physical exam and possibly order blood tests and X-rays to help determine the seriousness of your pain. However, in many cases, more time must pass before a clear cause of the pain can be found. Before that point, your health care provider may not know if you need more testing or further treatment. HOME CARE INSTRUCTIONS  Monitor your abdominal pain for any changes. The following actions may help to alleviate any discomfort you are experiencing:  Only take over-the-counter or prescription medicines as directed by your health care provider.  Do not take laxatives unless directed to do so by your health care provider.  Try a clear liquid diet (broth, tea, or water) as directed by your health care provider. Slowly move to a bland diet as tolerated. SEEK MEDICAL CARE IF:  You have unexplained abdominal pain.  You have abdominal pain associated with nausea or diarrhea.  You have pain when you urinate or have a bowel movement.  You experience abdominal pain that wakes you in the night.  You have abdominal pain that is worsened or improved by eating food.  You have abdominal pain that is worsened with eating fatty foods. SEEK IMMEDIATE MEDICAL CARE IF:   Your pain does not go away within 2 hours.  You have a fever.  You keep throwing up (vomiting).  Your pain is felt only in portions of the abdomen, such as the right side or the left lower portion of the abdomen.  You pass bloody or black tarry stools. MAKE SURE YOU:  Understand these instructions.   Will watch your condition.   Will get help right away if you are not doing well or get worse.  Document Released: 04/06/2005 Document Revised:  04/17/2013 Document Reviewed: 03/06/2013 [facility] Patient Information 2014 [place], [place].  Diet for Diarrhea, Adult Frequent, runny stools (diarrhea) may be caused or worsened by food or drink. Diarrhea may be relieved by changing your diet. Since diarrhea can last up to 7 days, it is easy for you to lose too much fluid from the body and become dehydrated. Fluids that are lost need to be replaced. Along with a modified diet, make sure you drink enough fluids to keep your urine clear or pale yellow. DIET INSTRUCTIONS  Ensure adequate fluid intake (hydration): have 1 cup (8 oz) of fluid for each diarrhea episode. Avoid fluids that contain simple sugars or sports drinks, fruit juices, whole milk products, and sodas. Your urine should be clear or pale yellow if you are drinking enough fluids. Hydrate with an oral rehydration solution that you can purchase at pharmacies, retail stores, and online. You can prepare an oral rehydration solution at home by mixing the following ingredients together:    tsp table salt.   tsp baking soda.   tsp salt substitute containing potassium chloride.  1  tablespoons sugar.  1 L (34 oz) of water.  Certain foods and beverages may increase the speed at which food moves through the gastrointestinal (GI) tract. These foods and beverages should be avoided and include:  Caffeinated and alcoholic beverages.  High-fiber foods, such as raw fruits and vegetables, nuts, seeds, and whole grain breads and cereals.  Foods and beverages sweetened with sugar alcohols, such as xylitol,  sorbitol, and mannitol.  Some foods may be well tolerated and may help thicken stool including:  Starchy foods, such as rice, toast, pasta, low-sugar cereal, oatmeal, grits, baked potatoes, crackers, and bagels.   Bananas.   Applesauce.  Add probiotic-rich foods to help increase healthy bacteria in the GI tract, such as yogurt and fermented milk products. RECOMMENDED FOODS AND  BEVERAGES Starches Choose foods with less than 2 g of fiber per serving.  Recommended:  White, [place], and pita breads, plain rolls, buns, bagels. Plain muffins, matzo. Soda, saltine, or graham crackers. Pretzels, melba toast, zwieback. Cooked cereals made with water: cornmeal, farina, cream cereals. Dry cereals: refined corn, wheat, rice. Potatoes prepared any way without skins, refined macaroni, spaghetti, noodles, refined rice.  Avoid:  Bread, rolls, or crackers made with whole wheat, multi-grains, rye, bran seeds, nuts, or coconut. Corn tortillas or taco shells. Cereals containing whole grains, multi-grains, bran, coconut, nuts, raisins. Cooked or dry oatmeal. Coarse wheat cereals, granola. Cereals advertised as "high-fiber." Potato skins. Whole grain pasta, wild or brown rice. Popcorn. Sweet potatoes, yams. Sweet rolls, doughnuts, waffles, pancakes, sweet breads. Vegetables  Recommended: Strained tomato and vegetable juices. Most well-cooked and canned vegetables without seeds. Fresh: Tender lettuce, cucumber without the skin, cabbage, spinach, bean sprouts.  Avoid: Fresh, cooked, or canned: Artichokes, baked beans, beet greens, broccoli, Brussels sprouts, corn, kale, legumes, peas, sweet potatoes. Cooked: Green or red cabbage, spinach. Avoid large servings of any vegetables because vegetables shrink when cooked, and they contain more fiber per serving than fresh vegetables. Fruit  Recommended: Cooked or canned: Apricots, applesauce, cantaloupe, cherries, fruit cocktail, grapefruit, grapes, kiwi, mandarin oranges, peaches, pears, plums, watermelon. Fresh: Apples without skin, ripe banana, grapes, cantaloupe, cherries, grapefruit, peaches, oranges, plums. Keep servings limited to  cup or 1 piece.  Avoid: Fresh: Apples with skin, apricots, mangoes, pears, raspberries, strawberries. Prune juice, stewed or dried prunes. Dried fruits, raisins, dates. Large servings of all fresh  fruits. Protein  Recommended: Ground or well-cooked tender beef, ham, veal, lamb, pork, or poultry. Eggs. Fish, oysters, shrimp, lobster, other seafoods. Liver, organ meats.  Avoid: Tough, fibrous meats with gristle. Peanut butter, smooth or chunky. Cheese, nuts, seeds, legumes, dried peas, beans, lentils. Dairy  Recommended: Yogurt, lactose-free milk, kefir, drinkable yogurt, buttermilk, soy milk, or plain hard cheese.  Avoid: Milk, chocolate milk, beverages made with milk, such as milkshakes. Soups  Recommended: Bouillon, broth, or soups made from allowed foods. Any strained soup.  Avoid: Soups made from vegetables that are not allowed, cream or milk-based soups. Desserts and Sweets  Recommended: Sugar-free gelatin, sugar-free frozen ice pops made without sugar alcohol.  Avoid: Plain cakes and cookies, pie made with fruit, pudding, custard, cream pie. Gelatin, fruit, ice, sherbet, frozen ice pops. Ice cream, ice milk without nuts. Plain hard candy, honey, jelly, molasses, syrup, sugar, chocolate syrup, gumdrops, marshmallows. Fats and Oils  Recommended: Limit fats to less than 8 tsp per day.  Avoid: Seeds, nuts, olives, avocados. Margarine, butter, cream, mayonnaise, salad oils, plain salad dressings. Plain gravy, crisp bacon without rind. Beverages  Recommended: Water, decaffeinated teas, oral rehydration solutions, sugar-free beverages not sweetened with sugar alcohols.  Avoid: Fruit juices, caffeinated beverages (coffee, tea, soda), alcohol, sports drinks, or lemon-lime soda. Condiments  Recommended: Ketchup, mustard, horseradish, vinegar, cocoa powder. Spices in moderation: allspice, basil, bay leaves, celery powder or leaves, cinnamon, cumin powder, curry powder, ginger, mace, marjoram, onion or garlic powder, oregano, paprika, parsley flakes, ground pepper, rosemary, sage, savory, tarragon, thyme, turmeric.  Avoid: Coconut, honey. Document Released: 09/17/2003 Document  Revised: 03/21/2012 Document Reviewed: 11/11/2011 [facility] Patient Information 2014 [place], [place].

## 2013-10-09 NOTE — ED Provider Notes (Signed)
Medical screening examination/treatment/procedure(s) were performed by non-physician practitioner and as supervising physician I was immediately available for consultation/collaboration.   Celene KrasJon R Quanta Roher, MD 10/09/13 [phone]

## 2013-10-09 NOTE — ED Provider Notes (Signed)
CSN: [id number]     Arrival date & time 10/09/13  1112 History   First MD Initiated Contact with Patient 10/09/13 1148     Chief Complaint  Patient presents with  . Abdominal Pain  . Diarrhea     (Consider location/radiation/quality/duration/timing/severity/associated sxs/prior Treatment) HPI Comments: Pt is a 43 y/o female with a PMHx of COPD, depression, GERD, hepatitis C, migraines, chronic lower back pain, anxiety and kidney stones who presents to the ED complaining of continued diarrhea, abdominal pain and nausea x 9 days. She was seen in the ED 3/29 for the same after seeing her PCP. PCP had started her on cipro and flagyl for UTI and diarhhea. He collected stool samples at that time which have not resulted. In the ED, she had labs and abdominal plane films, elevated LFTs which is chronic, no acute findings. She was given bentyl and phenergan which she states is helping with nausea and has not vomited, however the diarrhea is continuing, non-bloody, along with upper abdominal pain. She is scheduled to see GI next month, however states she could not wait to be seen again. States her PCP thinks she may have IBS. Denies fever or chills. Hx of cholecystectomy, tubal ligation and vaginal hysterectomy.  Patient is a 43 y.o. female presenting with abdominal pain and diarrhea. The history is provided by the patient and medical records.  Abdominal Pain Associated symptoms: diarrhea and nausea   Diarrhea Associated symptoms: abdominal pain     Past Medical History  Diagnosis Date  . Chronic bronchitis     "get it sometimes twice/yr" (07/25/2013)  . Pelvic fracture     non-surgical mgt.   . Vaginal delivery 1999  . Depression   . COPD (chronic obstructive pulmonary disease)   . Pneumonia 1979; 2004    "double";   Marland Kitchen CAP (community acquired pneumonia) 07/25/2013  . Shortness of breath     "comes on at anytime" (07/25/2013)  . GERD (gastroesophageal reflux disease)   . Hepatitis C 03/2012   [facility]    . Migraine     "once or twice/month" (07/25/2013)  . Chronic lower back pain   . Anxiety   . Kidney stone     "passed on my own"   Past Surgical History  Procedure Laterality Date  . Cholecystectomy  2009    in [place]  . Tubal ligation  1999  . Vaginal hysterectomy  2001    "partial", for menorrhagia associated with uterine fibroids.   Marland Kitchen Hysteroscopy  1999   History reviewed. No pertinent family history. History  Substance Use Topics  . Smoking status: Current Every Day Smoker -- 0.50 packs/day for 15 years    Types: Cigarettes  . Smokeless tobacco: Never Used  . Alcohol Use: No   OB History   Grav Para Term Preterm Abortions TAB SAB Ect Mult Living                 Review of Systems  Gastrointestinal: Positive for nausea, abdominal pain and diarrhea.  Genitourinary: Positive for frequency.  All other systems reviewed and are negative.      Allergies  Levaquin; Bactrim; Levaquin; and Penicillins  Home Medications   Current Outpatient Rx  Name  Route  Sig  Dispense  Refill  . albuterol (PROVENTIL HFA;VENTOLIN HFA) 108 (90 BASE) MCG/ACT inhaler   Inhalation   Inhale 2 puffs into the lungs every 6 (six) hours as needed for wheezing or shortness of breath.         Marland Kitchen  amitriptyline (ELAVIL) 50 MG tablet   Oral   Take 50 mg by mouth at bedtime.         . dicyclomine (BENTYL) 20 MG tablet   Oral   Take 1 tablet (20 mg total) by mouth 2 (two) times daily.   20 tablet   0   . ibuprofen (ADVIL,MOTRIN) 800 MG tablet   Oral   Take 800 mg by mouth every 8 (eight) hours as needed.         Marland Kitchen. ipratropium (ATROVENT HFA) 17 MCG/ACT inhaler   Inhalation   Inhale 2 puffs into the lungs every 6 (six) hours.   1 Inhaler   12   . loratadine (CLARITIN) 10 MG tablet   Oral   Take 10 mg by mouth daily.         . mometasone-formoterol (DULERA) 100-5 MCG/ACT AERO   Inhalation   Inhale 2 puffs into the lungs 2 (two) times  daily.         . promethazine (PHENERGAN) 25 MG tablet   Oral   Take 1 tablet (25 mg total) by mouth every 6 (six) hours as needed for nausea or vomiting.   12 tablet   0    BP 124/73  Pulse 64  Temp(Src) 98.4 F (36.9 C) (Oral)  Resp 14  SpO2 100% Physical Exam  Nursing note and vitals reviewed. Constitutional: She is oriented to person, place, and time. She appears well-developed and well-nourished. No distress.  HENT:  Head: Normocephalic and atraumatic.  Mouth/Throat: Oropharynx is clear and moist.  Eyes: Conjunctivae are normal. No scleral icterus.  Neck: Normal range of motion. Neck supple.  Cardiovascular: Normal rate, regular rhythm and normal heart sounds.   Pulmonary/Chest: Effort normal and breath sounds normal.  Abdominal: Soft. Normal appearance and bowel sounds are normal. She exhibits no distension. There is tenderness in the right upper quadrant and epigastric area. There is no rigidity, no rebound and no guarding.  No peritoneal signs.  Musculoskeletal: Normal range of motion. She exhibits no edema.  Neurological: She is alert and oriented to person, place, and time.  Skin: Skin is warm and dry. She is not diaphoretic.  Psychiatric: She has a normal mood and affect. Her behavior is normal.    ED Course  Procedures (including critical care time) Labs Review Labs Reviewed  URINALYSIS, ROUTINE W REFLEX MICROSCOPIC - Abnormal; Notable for the following:    Hgb urine dipstick TRACE (*)    All other components within normal limits  COMPREHENSIVE METABOLIC PANEL - Abnormal; Notable for the following:    AST 51 (*)    ALT 84 (*)    All other components within normal limits  CBC  LIPASE, BLOOD  URINE MICROSCOPIC-ADD ON   Imaging Review Ct Abdomen Pelvis Wo Contrast  10/09/2013   CLINICAL DATA:  Abdominal pain with diarrhea. Previous hysterectomy and cholecystectomy.  EXAM: CT ABDOMEN AND PELVIS WITHOUT CONTRAST  TECHNIQUE: Multidetector CT imaging of the  abdomen and pelvis was performed following the standard protocol without intravenous contrast.  COMPARISON:  DG ABD 2 VIEWS dated 10/06/2013; CT ABD/PELVIS W CM dated 07/25/2013; MR MRCP WO/W CM dated 07/26/2013  FINDINGS: Imaging through the lung bases shows dependent atelectasis and both lower lobes.  1.7 cm well-defined low-density lesion in the anterior right liver is unchanged, compatible with a cyst. The mild biliary distension characterized on previous studies is unchanged. The spleen is unremarkable. The stomach is decompressed. The duodenum, pancreas, and adrenal glands are  unremarkable. Gallbladder is surgically absent. No stones are seen in either kidney. No hydronephrosis.  No abdominal aortic aneurysm. There is no free fluid or lymphadenopathy in the abdomen.  No evidence for small bowel dilatation. Oral contrast material has migrated through into the colon.  Imaging through the pelvis shows no intraperitoneal free fluid. There is no pelvic sidewall lymphadenopathy. Uterus is surgically absent. Mild soft tissue fullness along the left aspect of the vaginal cuff is unchanged. There is no adnexal mass. The left ovarian cyst seen on the previous study has resolved in the interval.  No substantial diverticular disease in the colon. No colonic wall thickening or dilatation. Terminal ileum is normal. The appendix is normal.  Bone windows reveal no worrisome lytic or sclerotic osseous lesions.  IMPRESSION: Stable exam. No acute findings in the abdomen or pelvis. Specifically, no findings to explain the patient's history of pain and diarrhea.   Electronically Signed   By: Kennith Center M.D.   On: 10/09/2013 15:18     EKG Interpretation None      MDM   Final diagnoses:  Abdominal pain  Nausea  Diarrhea   2nd visit in 3 days for the same, abdominal pain, nausea, diarrhea. She is well appearing and in NAD, afebrile, normal VS. Abdomen tender RUQ, mid-epigastric, no peritoneal signs. On chart review,  she had a CT abd done 2 months ago, followed by an MRCP showing Mild diffuse biliary dilatation with common bile duct measuring 11 mm. No evidence of choledocholithiasis or other obstructing etiology  by MRI. This may be related to prior cholecystectomy. Small benign hepatic and renal cysts. No evidence of neoplasm. Bilateral lower lobe infiltrates again noted. Plane film at last ED visit normal. Plan to obtain CT w contrast to evaluate for any changes. Stool cultures in process from PCP. Labs pending. 3:29 PM Labs without acute finding, LFT elevation chronic. CT without acute finding. Pt was able to tolerate drinking oral contrast without vomiting. No diarrhea in ED. Reports her pain is still present but improved. Repeat abdominal exam abdomen is soft, tender, but improved from initial exam. Stable for d/c. Advised her to call GI office again and f/u with PCP. Continue pentyl and phenergan. Return precautions given. Patient states understanding of treatment care plan and is agreeable.   Trevor Mace, PA-C 10/09/13 1534

## 2013-10-09 NOTE — ED Notes (Signed)
Per pt, diarrhea since last Monday.  Seen here recently for same.  Also c/o abdominal pain.  Nausea with no vomiting.  No fever.  Frequent urination.

## 2013-10-10 LAB — CLOSTRIDIUM DIFFICILE CULTURE-FECAL

## 2013-10-11 NOTE — ED Provider Notes (Signed)
Medical screening examination/treatment/procedure(s) were performed by non-physician practitioner and as supervising physician I was immediately available for consultation/collaboration.   Cana Mignano T Kristiann Noyce, MD 10/11/13 2317 

## 2013-11-21 ENCOUNTER — Encounter: Payer: Self-pay | Admitting: Gastroenterology

## 2013-11-22 ENCOUNTER — Ambulatory Visit: Payer: No Typology Code available for payment source | Admitting: Gastroenterology

## 2013-11-25 ENCOUNTER — Ambulatory Visit: Payer: No Typology Code available for payment source | Admitting: Internal Medicine

## 2013-11-28 ENCOUNTER — Emergency Department (HOSPITAL_COMMUNITY): Payer: No Typology Code available for payment source

## 2013-11-28 ENCOUNTER — Encounter (HOSPITAL_COMMUNITY): Payer: Self-pay | Admitting: Emergency Medicine

## 2013-11-28 ENCOUNTER — Emergency Department (HOSPITAL_COMMUNITY)
Admission: EM | Admit: 2013-11-28 | Discharge: 2013-11-28 | Disposition: A | Discharge status: Home / Self Care | Payer: No Typology Code available for payment source | Attending: Emergency Medicine | Admitting: Emergency Medicine

## 2013-11-28 DIAGNOSIS — Z87442 Personal history of urinary calculi: Secondary | ICD-10-CM | POA: Insufficient documentation

## 2013-11-28 DIAGNOSIS — Y939 Activity, unspecified: Secondary | ICD-10-CM | POA: Insufficient documentation

## 2013-11-28 DIAGNOSIS — F3289 Other specified depressive episodes: Secondary | ICD-10-CM | POA: Insufficient documentation

## 2013-11-28 DIAGNOSIS — Z79899 Other long term (current) drug therapy: Secondary | ICD-10-CM | POA: Insufficient documentation

## 2013-11-28 DIAGNOSIS — S8990XA Unspecified injury of unspecified lower leg, initial encounter: Secondary | ICD-10-CM | POA: Insufficient documentation

## 2013-11-28 DIAGNOSIS — Z8701 Personal history of pneumonia (recurrent): Secondary | ICD-10-CM | POA: Insufficient documentation

## 2013-11-28 DIAGNOSIS — Z880 Allergy status to penicillin: Secondary | ICD-10-CM | POA: Insufficient documentation

## 2013-11-28 DIAGNOSIS — Y929 Unspecified place or not applicable: Secondary | ICD-10-CM | POA: Insufficient documentation

## 2013-11-28 DIAGNOSIS — Z8619 Personal history of other infectious and parasitic diseases: Secondary | ICD-10-CM | POA: Insufficient documentation

## 2013-11-28 DIAGNOSIS — Z8679 Personal history of other diseases of the circulatory system: Secondary | ICD-10-CM | POA: Insufficient documentation

## 2013-11-28 DIAGNOSIS — S99929A Unspecified injury of unspecified foot, initial encounter: Principal | ICD-10-CM

## 2013-11-28 DIAGNOSIS — W108XXA Fall (on) (from) other stairs and steps, initial encounter: Secondary | ICD-10-CM | POA: Insufficient documentation

## 2013-11-28 DIAGNOSIS — J4489 Other specified chronic obstructive pulmonary disease: Secondary | ICD-10-CM | POA: Insufficient documentation

## 2013-11-28 DIAGNOSIS — J449 Chronic obstructive pulmonary disease, unspecified: Secondary | ICD-10-CM | POA: Insufficient documentation

## 2013-11-28 DIAGNOSIS — M79605 Pain in left leg: Secondary | ICD-10-CM

## 2013-11-28 DIAGNOSIS — F329 Major depressive disorder, single episode, unspecified: Secondary | ICD-10-CM | POA: Insufficient documentation

## 2013-11-28 DIAGNOSIS — F411 Generalized anxiety disorder: Secondary | ICD-10-CM | POA: Insufficient documentation

## 2013-11-28 DIAGNOSIS — S99919A Unspecified injury of unspecified ankle, initial encounter: Principal | ICD-10-CM

## 2013-11-28 DIAGNOSIS — Z8781 Personal history of (healed) traumatic fracture: Secondary | ICD-10-CM | POA: Insufficient documentation

## 2013-11-28 DIAGNOSIS — G8929 Other chronic pain: Secondary | ICD-10-CM | POA: Insufficient documentation

## 2013-11-28 DIAGNOSIS — IMO0002 Reserved for concepts with insufficient information to code with codable children: Secondary | ICD-10-CM | POA: Insufficient documentation

## 2013-11-28 MED ORDER — OXYCODONE-ACETAMINOPHEN 5-325 MG PO TABS
2.0000 | ORAL_TABLET | Freq: Once | ORAL | Status: AC
  Administered 2013-11-28: 2 via ORAL
  Filled 2013-11-28: qty 2

## 2013-11-28 MED ORDER — OXYCODONE-ACETAMINOPHEN 5-325 MG PO TABS: 1.0000 | ORAL_TABLET | ORAL | Status: DC | PRN

## 2013-11-28 NOTE — ED Notes (Signed)
MD at bedside at this time.

## 2013-11-28 NOTE — ED Notes (Signed)
Patient transported to X-ray 

## 2013-11-28 NOTE — Discharge Instructions (Signed)
Rest for one or 2 days. After that, get some light exercise, and use heat on the sore area 3 times a day. Followup with a doctor of your choice or an orthopedic doctor if not better in one or 2 weeks.    Pain of Unknown Etiology (Pain Without a Known Cause) You have come to your caregiver because of pain. Pain can occur in any part of the body. Often there is not a definite cause. If your laboratory (blood or urine) work was normal and X-rays or other studies were normal, your caregiver may treat you without knowing the cause of the pain. An example of this is the headache. Most headaches are diagnosed by taking a history. This means your caregiver asks you questions about your headaches. Your caregiver determines a treatment based on your answers. Usually testing done for headaches is normal. Often testing is not done unless there is no response to medications. Regardless of where your pain is located today, you can be given medications to make you comfortable. If no physical cause of pain can be found, most cases of pain will gradually leave as suddenly as they came.  If you have a painful condition and no reason can be found for the pain, it is important that you follow up with your caregiver. If the pain becomes worse or does not go away, it may be necessary to repeat tests and look further for a possible cause.  Only take over-the-counter or prescription medicines for pain, discomfort, or fever as directed by your caregiver.  For the protection of your privacy, test results cannot be given over the phone. Make sure you receive the results of your test. Ask how these results are to be obtained if you have not been informed. It is your responsibility to obtain your test results.  You may continue all activities unless the activities cause more pain. When the pain lessens, it is important to gradually resume normal activities. Resume activities by beginning slowly and gradually increasing the  intensity and duration of the activities or exercise. During periods of severe pain, bed rest may be helpful. Lie or sit in any position that is comfortable.  Ice used for acute (sudden) conditions may be effective. Use a large plastic bag filled with ice and wrapped in a towel. This may provide pain relief.  See your caregiver for continued problems. Your caregiver can help or refer you for exercises or physical therapy if necessary. If you were given medications for your condition, do not drive, operate machinery or power tools, or sign legal documents for 24 hours. Do not drink alcohol, take sleeping pills, or take other medications that may interfere with treatment. See your caregiver immediately if you have pain that is becoming worse and not relieved by medications. Document Released: 03/22/2001 Document Revised: 04/17/2013 Document Reviewed: 06/27/2005 [facility] Patient Information 2014 [employer], [place].

## 2013-11-28 NOTE — ED Notes (Signed)
Pt states 6 years ago she broke her pelvis  Pt states her lower back and hip have been bothering her for a few weeks now but yesterday she fell  Pt states since then she is having increased pain to her lower back on the left side and her left hip and groin area  Pt states when she puts pressure on it she has shooting pain down her leg  Pt states when she is just sitting or laying it is a deep sharp pain

## 2013-11-28 NOTE — ED Provider Notes (Signed)
CSN: [id number]     Arrival date & time 11/28/13  [id number] History   First MD Initiated Contact with Patient 11/28/13 [phone]     Chief Complaint  Patient presents with  . Hip Pain  . Back Pain     (Consider location/radiation/quality/duration/timing/severity/associated sxs/prior Treatment) Patient is a 43 y.o. female presenting with hip pain and back pain. The history is provided by the patient.  Hip Pain  Back Pain  She has had left upper leg pain for 10 days, that she relates to an old injury. Also, she injured the left leg yesterday, when she fell down some steps. This morning, she was on her way to work, but the leg was hurting, so she came here instead. She is using Motrin 800 mg, 3 times a day, without relief of her pain. She denies fever, chills, nausea, vomiting, weakness, dizziness, bowel or urinary incontinence. She's had similar problems in the past. She has chronic low back pain related to an old injury. There are no other known modifying factors.     Past Medical History  Diagnosis Date  . Chronic bronchitis     "get it sometimes twice/yr" (07/25/2013)  . Pelvic fracture     non-surgical mgt.   . Vaginal delivery 1999  . Depression   . COPD (chronic obstructive pulmonary disease)   . Pneumonia 1979; 2004    "double";   Marland Kitchen. CAP (community acquired pneumonia) 07/25/2013  . Shortness of breath     "comes on at anytime" (07/25/2013)  . GERD (gastroesophageal reflux disease)   . Hepatitis C 03/2012    diagn[facility].    . Migraine     "once or twice/month" (07/25/2013)  . Chronic lower back pain   . Anxiety   . Kidney stone     "passed on my own"   Past Surgical History  Procedure Laterality Date  . [place]  . Tubal ligation  1999  . Vaginal hysterectomy  2001    "partial", for menorrhagia associated with uterine fibroids.   Marland Kitchen. Hysteroscopy  1999   Family History  Problem Relation Age of Onset  . Aneurysm Father   .  Cancer Other   . Diabetes Other   . Thyroid disease Other   . Irritable bowel syndrome Other   . Aneurysm Other    History  Substance Use Topics  . Smoking status: Current Every Day Smoker -- 0.50 packs/day for 15 years    Types: Cigarettes  . Smokeless tobacco: Never Used  . Alcohol Use: No   OB History   Grav Para Term Preterm Abortions TAB SAB Ect Mult Living                 Review of Systems  Musculoskeletal: Positive for back pain.  All other systems reviewed and are negative.     Allergies  Levaquin; Bactrim; Levaquin; and Penicillins  Home Medications   Prior to Admission medications   Medication Sig Start Date End Date Taking? Authorizing Provider  albuterol (PROVENTIL HFA;VENTOLIN HFA) 108 (90 BASE) MCG/ACT inhaler Inhale 2 puffs into the lungs every 6 (six) hours as needed for wheezing or shortness of breath.    Historical Provider, MD  amitriptyline (ELAVIL) 50 MG tablet Take 50 mg by mouth at bedtime.    Historical Provider, MD  dicyclomine (BENTYL) 20 MG tablet Take 1 tablet (20 mg total) by mouth 2 (two) times daily. 10/06/13  Dorthula Matasiffany G Greene, PA-C  ibuprofen (ADVIL,MOTRIN) 800 MG tablet Take 800 mg by mouth every 8 (eight) hours as needed.    Historical Provider, MD  ipratropium (ATROVENT HFA) 17 MCG/ACT inhaler Inhale 2 puffs into the lungs every 6 (six) hours. 07/29/13   Annett Gularacy N McLean, MD  loratadine (CLARITIN) 10 MG tablet Take 10 mg by mouth daily.    Historical Provider, MD  mometasone-formoterol (DULERA) 100-5 MCG/ACT AERO Inhale 2 puffs into the lungs 2 (two) times daily.    Historical Provider, MD  promethazine (PHENERGAN) 25 MG tablet Take 1 tablet (25 mg total) by mouth every 6 (six) hours as needed for nausea or vomiting. 10/06/13   Dorthula Matasiffany G Greene, PA-C   BP 119/85  Pulse 93  Temp(Src) 98.3 F (36.8 C) (Oral)  SpO2 100% Physical Exam  Nursing note and vitals reviewed. Constitutional: She is oriented to person, place, and time. She appears  well-developed and well-nourished. She appears distressed (She is tearful).  HENT:  Head: Normocephalic and atraumatic.  Eyes: Conjunctivae and EOM are normal. Pupils are equal, round, and reactive to light.  Neck: Normal range of motion and phonation normal. Neck supple.  Cardiovascular: Normal rate, regular rhythm and intact distal pulses.   Pulmonary/Chest: Effort normal and breath sounds normal. She exhibits no tenderness.  Abdominal: Soft. She exhibits no distension. There is no tenderness. There is no guarding.  Musculoskeletal: Normal range of motion.  She sits easily on the stretcher without limitation of movement in the lower back or pelvic regions.  She has mild tenderness to palpation over the left groin region without mass. There is no tenderness to palpation of either hip. There is normal range of motion of the right leg. There is somewhat limited, left hip flexion, secondary to pain.  Neurological: She is alert and oriented to person, place, and time. She exhibits normal muscle tone.  Skin: Skin is warm and dry.  Psychiatric: She has a normal mood and affect. Her behavior is normal. Judgment and thought content normal.    ED Course  Procedures (including critical care time)  Medications  oxyCODONE-acetaminophen (PERCOCET/ROXICET) 5-325 MG per tablet 2 tablet (not administered)    Patient Vitals for the past 24 hrs:  BP Temp Temp src Pulse SpO2  11/28/13 0639 119/85 mmHg 98.3 F (36.8 C) Oral 93 100 %    8:20 AM Reevaluation with update and discussion. After initial assessment and treatment, an updated evaluation reveals she is comfortable now. No further c/o. Findings discussed with pt., all questions answered. Flint MelterElliott L Drystan Reader    Labs Review Labs Reviewed - No data to display  Imaging Review No results found.   EKG Interpretation None      MDM   Final diagnoses:  Leg pain, left    Ongoing hip pain aggravated in fall. Clinically, this is soft tissue  problems. Doubt fx, Nerve impingement or Cauda Equina syndrome. Nursing Notes Reviewed/ Care Coordinated Applicable Imaging Reviewed Interpretation of Laboratory Data incorporated into ED treatment  The patient appears reasonably screened and/or stabilized for discharge and I doubt any other medical condition or other [facility] requiring further screening, evaluation, or treatment in the ED at this time prior to discharge.  Plan: Home Medications- Percocet #10; Home Treatments- rest, heat; return here if the recommended treatment, does not improve the symptoms; Recommended follow up- PCP prn   Flint MelterElliott L Dava Rensch, MD 11/28/13 [phone]

## 2013-12-02 ENCOUNTER — Emergency Department (HOSPITAL_COMMUNITY)
Admission: EM | Admit: 2013-12-02 | Discharge: 2013-12-02 | Disposition: A | Discharge status: Home / Self Care | Payer: No Typology Code available for payment source | Attending: Emergency Medicine | Admitting: Emergency Medicine

## 2013-12-02 ENCOUNTER — Encounter (HOSPITAL_COMMUNITY): Payer: Self-pay | Admitting: Emergency Medicine

## 2013-12-02 ENCOUNTER — Emergency Department (HOSPITAL_COMMUNITY): Payer: No Typology Code available for payment source

## 2013-12-02 DIAGNOSIS — IMO0002 Reserved for concepts with insufficient information to code with codable children: Secondary | ICD-10-CM | POA: Insufficient documentation

## 2013-12-02 DIAGNOSIS — Z87442 Personal history of urinary calculi: Secondary | ICD-10-CM | POA: Insufficient documentation

## 2013-12-02 DIAGNOSIS — M25559 Pain in unspecified hip: Secondary | ICD-10-CM | POA: Insufficient documentation

## 2013-12-02 DIAGNOSIS — F17200 Nicotine dependence, unspecified, uncomplicated: Secondary | ICD-10-CM | POA: Insufficient documentation

## 2013-12-02 DIAGNOSIS — Z880 Allergy status to penicillin: Secondary | ICD-10-CM | POA: Insufficient documentation

## 2013-12-02 DIAGNOSIS — F3289 Other specified depressive episodes: Secondary | ICD-10-CM | POA: Insufficient documentation

## 2013-12-02 DIAGNOSIS — F411 Generalized anxiety disorder: Secondary | ICD-10-CM | POA: Insufficient documentation

## 2013-12-02 DIAGNOSIS — R1032 Left lower quadrant pain: Secondary | ICD-10-CM | POA: Insufficient documentation

## 2013-12-02 DIAGNOSIS — G8929 Other chronic pain: Secondary | ICD-10-CM | POA: Insufficient documentation

## 2013-12-02 DIAGNOSIS — Z79899 Other long term (current) drug therapy: Secondary | ICD-10-CM | POA: Insufficient documentation

## 2013-12-02 DIAGNOSIS — M5416 Radiculopathy, lumbar region: Secondary | ICD-10-CM

## 2013-12-02 DIAGNOSIS — Z8719 Personal history of other diseases of the digestive system: Secondary | ICD-10-CM | POA: Insufficient documentation

## 2013-12-02 DIAGNOSIS — Z8619 Personal history of other infectious and parasitic diseases: Secondary | ICD-10-CM | POA: Insufficient documentation

## 2013-12-02 DIAGNOSIS — Z8669 Personal history of other diseases of the nervous system and sense organs: Secondary | ICD-10-CM | POA: Insufficient documentation

## 2013-12-02 DIAGNOSIS — Z8781 Personal history of (healed) traumatic fracture: Secondary | ICD-10-CM | POA: Insufficient documentation

## 2013-12-02 DIAGNOSIS — Z8701 Personal history of pneumonia (recurrent): Secondary | ICD-10-CM | POA: Insufficient documentation

## 2013-12-02 DIAGNOSIS — J449 Chronic obstructive pulmonary disease, unspecified: Secondary | ICD-10-CM | POA: Insufficient documentation

## 2013-12-02 DIAGNOSIS — J4489 Other specified chronic obstructive pulmonary disease: Secondary | ICD-10-CM | POA: Insufficient documentation

## 2013-12-02 DIAGNOSIS — F329 Major depressive disorder, single episode, unspecified: Secondary | ICD-10-CM | POA: Insufficient documentation

## 2013-12-02 MED ORDER — HYDROCODONE-ACETAMINOPHEN 5-325 MG PO TABS: 1.0000 | ORAL_TABLET | ORAL | Status: DC | PRN

## 2013-12-02 MED ORDER — OXYCODONE-ACETAMINOPHEN 5-325 MG PO TABS
1.0000 | ORAL_TABLET | Freq: Once | ORAL | Status: AC
  Administered 2013-12-02: 1 via ORAL
  Filled 2013-12-02: qty 1
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
## 2013-12-02 NOTE — ED Notes (Signed)
Patient transported to X-ray 

## 2013-12-02 NOTE — ED Notes (Addendum)
Pt c/o pelvic pain same symptoms as we pt was seen on the [address], pt states she is suppose to go back to work tomorrow and pain is unbearable. State she is unable to stand and walk w/o pain. Pt c/o same pain as the 21st in left pelvic, lower back and into buttocks.

## 2013-12-02 NOTE — ED Provider Notes (Signed)
Medical screening examination/treatment/procedure(s) were performed by non-physician practitioner and as supervising physician I was immediately available for consultation/collaboration.    Askari Kinley, MD 12/02/13 1534 

## 2013-12-02 NOTE — Discharge Instructions (Signed)
Read the information below.  Use the prescribed medication as directed.  Please discuss all new medications with your pharmacist.  Do not take additional tylenol while taking the prescribed pain medication to avoid overdose.  You may return to the Emergency Department at any time for worsening condition or any new symptoms that concern you.   If you develop fevers, loss of control of bowel or bladder, weakness or numbness in your legs, or are unable to walk, return to the ER for a recheck.    Lumbosacral Radiculopathy Lumbosacral radiculopathy is a pinched nerve or nerves in the low back (lumbosacral area). When this happens you may have weakness in your legs and may not be able to stand on your toes. You may have pain going down into your legs. There may be difficulties with walking normally. There are many causes of this problem. Sometimes this may happen from an injury, or simply from arthritis or boney problems. It may also be caused by other illnesses such as diabetes. If there is no improvement after treatment, further studies may be done to find the exact cause. DIAGNOSIS  X-rays may be needed if the problems become long standing. Electromyograms may be done. This study is one in which the working of nerves and muscles is studied. HOME CARE INSTRUCTIONS   Applications of ice packs may be helpful. Ice can be used in a plastic bag with a towel around it to prevent frostbite to skin. This may be used every 2 hours for 20 to 30 minutes, or as needed, while awake, or as directed by your caregiver.  Only take over-the-counter or prescription medicines for pain, discomfort, or fever as directed by your caregiver.  If physical therapy was prescribed, follow your caregiver's directions. SEEK IMMEDIATE MEDICAL CARE IF:   You have pain not controlled with medications.  You seem to be getting worse rather than better.  You develop increasing weakness in your legs.  You develop loss of bowel or  bladder control.  You have difficulty with walking or balance, or develop clumsiness in the use of your legs.  You have a fever. MAKE SURE YOU:   Understand these instructions.  Will watch your condition.  Will get help right away if you are not doing well or get worse. Document Released: 06/27/2005 Document Revised: 09/19/2011 Document Reviewed: 02/15/2008 [facility] Patient Information 2014 [place], [place].

## 2013-12-12 ENCOUNTER — Ambulatory Visit: Payer: No Typology Code available for payment source | Attending: Internal Medicine | Admitting: Internal Medicine

## 2013-12-12 ENCOUNTER — Encounter: Payer: Self-pay | Admitting: Internal Medicine

## 2013-12-12 VITALS — BP 118/82 | HR 90 | Temp 98.4°F | Resp 20 | Wt 199.0 lb

## 2013-12-12 DIAGNOSIS — M549 Dorsalgia, unspecified: Secondary | ICD-10-CM

## 2013-12-12 MED ORDER — MELOXICAM 15 MG PO TABS: 15.0000 mg | ORAL_TABLET | Freq: Every day | ORAL | Status: DC

## 2013-12-12 MED ORDER — CYCLOBENZAPRINE HCL 10 MG PO TABS: 10.0000 mg | ORAL_TABLET | Freq: Three times a day (TID) | ORAL | Status: DC | PRN

## 2013-12-12 NOTE — Patient Instructions (Signed)
Back Pain, Adult Low back pain is very common. About 1 in 5 people have back pain.The cause of low back pain is rarely dangerous. The pain often gets better over time.About half of people with a sudden onset of back pain feel better in just 2 weeks. About 8 in 10 people feel better by 6 weeks.  CAUSES Some common causes of back pain include:  Strain of the muscles or ligaments supporting the spine.  Wear and tear (degeneration) of the spinal discs.  Arthritis.  Direct injury to the back. DIAGNOSIS Most of the time, the direct cause of low back pain is not known.However, back pain can be treated effectively even when the exact cause of the pain is unknown.Answering your caregiver's questions about your overall health and symptoms is one of the most accurate ways to make sure the cause of your pain is not dangerous. If your caregiver needs more information, he or she may order lab work or imaging tests (X-rays or MRIs).However, even if imaging tests show changes in your back, this usually does not require surgery. HOME CARE INSTRUCTIONS For many people, back pain returns.Since low back pain is rarely dangerous, it is often a condition that people can learn to manageon their own.   Remain active. It is stressful on the back to sit or stand in one place. Do not sit, drive, or stand in one place for more than 30 minutes at a time. Take short walks on level surfaces as soon as pain allows.Try to increase the length of time you walk each day.  Do not stay in bed.Resting more than 1 or 2 days can delay your recovery.  Do not avoid exercise or work.Your body is made to move.It is not dangerous to be active, even though your back may hurt.Your back will likely heal faster if you return to being active before your pain is gone.  Pay attention to your body when you bend and lift. Many people have less discomfortwhen lifting if they bend their knees, keep the load close to their bodies,and  avoid twisting. Often, the most comfortable positions are those that put less stress on your recovering back.  Find a comfortable position to sleep. Use a firm mattress and lie on your side with your knees slightly bent. If you lie on your back, put a pillow under your knees.  Only take over-the-counter or prescription medicines as directed by your caregiver. Over-the-counter medicines to reduce pain and inflammation are often the most helpful.Your caregiver may prescribe muscle relaxant drugs.These medicines help dull your pain so you can more quickly return to your normal activities and healthy exercise.  Put ice on the injured area.  Put ice in a plastic bag.  Place a towel between your skin and the bag.  Leave the ice on for 15-20 minutes, 03-04 times a day for the first 2 to 3 days. After that, ice and heat may be alternated to reduce pain and spasms.  Ask your caregiver about trying back exercises and gentle massage. This may be of some benefit.  Avoid feeling anxious or stressed.Stress increases muscle tension and can worsen back pain.It is important to recognize when you are anxious or stressed and learn ways to manage it.Exercise is a great option. SEEK MEDICAL CARE IF:  You have pain that is not relieved with rest or medicine.  You have pain that does not improve in 1 week.  You have new symptoms.  You are generally not feeling well. SEEK   IMMEDIATE MEDICAL CARE IF:   You have pain that radiates from your back into your legs.  You develop new bowel or bladder control problems.  You have unusual weakness or numbness in your arms or legs.  You develop nausea or vomiting.  You develop abdominal pain.  You feel faint. Document Released: 06/27/2005 Document Revised: 12/27/2011 Document Reviewed: 11/15/2010 [employer] Patient Information 2014 [employer].  

## 2013-12-12 NOTE — Progress Notes (Signed)
Patient ID: Karen Baldwin, female   DOB: [birth year], 43 y.o.   MRN: [id number]  CC: hip/back pain  HPI:  Patient presents today for evaluation of hip and back pain, she states that she has been to ED 2 times in past 2 weeks.  She reports that they found a pinched nerve and DDD on imaging.  Reports that she fell 2 weeks ago and has been having pain that starts in lower back and shoots down buttocks and to legs.  She reports numbness and weakness. She states the left side back pain shots down bilateral legs and causes bilateral hip pain.  Affects her walking  Was never seen by Ortho due to insurance issues. Patient states that she lives in a homeless shelter and has to walk around d/t having to leave the shelter early in the morning.  Patient reports that she works on her feet all day aggravating the pain.  Allergies  Allergen Reactions  . Levaquin [Levofloxacin] Hives    Patient with pruritis and erythema at IV site after Levofloxacin adminisation  . Azithromycin Other (See Comments)    Liver failure   . Bactrim [Sulfamethoxazole-Trimethoprim] Hives    "skin burning"  . Levaquin [Levofloxacin In D5w] Itching  . Penicillins Other (See Comments)    unknown   Past Medical History  Diagnosis Date  . Chronic bronchitis     "get it sometimes twice/yr" (07/25/2013)  . Pelvic fracture     non-surgical mgt.   . Vaginal delivery 1999  . Depression   . COPD (chronic obstructive pulmonary disease)   . Pneumonia 1979; 2004    "double";   Marland Kitchen CAP (community acquired pneumonia) 07/25/2013  . Shortness of breath     "comes on at anytime" (07/25/2013)  . GERD (gastroesophageal reflux disease)   . Hepatitis C 03/2012    diagnosed by [facility].    . Migraine     "once or twice/month" (07/25/2013)  . Chronic lower back pain   . Anxiety   . Kidney stone     "passed on my own"   Current Outpatient Prescriptions on File Prior to Visit  Medication Sig Dispense Refill  . albuterol (PROVENTIL  HFA;VENTOLIN HFA) 108 (90 BASE) MCG/ACT inhaler Inhale 2 puffs into the lungs every 6 (six) hours as needed for wheezing or shortness of breath.      Marland Kitchen amitriptyline (ELAVIL) 50 MG tablet Take 50 mg by mouth at bedtime.      . dicyclomine (BENTYL) 20 MG tablet Take 20 mg by mouth every evening.      Marland Kitchen ibuprofen (ADVIL,MOTRIN) 800 MG tablet Take 800 mg by mouth every 8 (eight) hours as needed for moderate pain.       Marland Kitchen ipratropium (ATROVENT HFA) 17 MCG/ACT inhaler Inhale 2 puffs into the lungs every 4 (four) hours as needed for wheezing.      Marland Kitchen loratadine (CLARITIN) 10 MG tablet Take 10 mg by mouth every morning.       . mometasone-formoterol (DULERA) 100-5 MCG/ACT AERO Inhale 2 puffs into the lungs 2 (two) times daily.      . Naphazoline-Glycerin-Zinc Sulf (CLEAR EYES SEASONAL RELIEF) 0.012-0.2-0.25 % SOLN Apply 2-3 drops to eye continuous as needed (irritation).       Marland Kitchen HYDROcodone-acetaminophen (NORCO/VICODIN) 5-325 MG per tablet Take 1 tablet by mouth every 4 (four) hours as needed for moderate pain or severe pain.  15 tablet  0  . oxyCODONE-acetaminophen (PERCOCET/ROXICET) 5-325 MG per tablet Take  1 tablet by mouth every 4 (four) hours as needed for severe pain.       No current facility-administered medications on file prior to visit.   Family History  Problem Relation Age of Onset  . Aneurysm Father   . Cancer Other   . Diabetes Other   . Thyroid disease Other   . Irritable bowel syndrome Other   . Aneurysm Other    History   Social History  . Marital Status: Single    Spouse Name: N/A    Number of Children: 2  . Years of Education: N/A   Occupational History  . Not on file.   Social History Main Topics  . Smoking status: Current Every Day Smoker -- 0.50 packs/day for 15 years    Types: Cigarettes  . Smokeless tobacco: Never Used  . Alcohol Use: No  . 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 Sexual  Activity: Not Currently   Other Topics Concern  . Not on file   Social History Narrative   Patient was recently discharged from prison on December 19th, 2014. [facility]     Review of Systems: See HPI   Objective:   Filed Vitals:   12/12/13 1223  BP: 118/82  Pulse: 90  Temp: 98.4 F (36.9 C)  Resp: 20   Physical Exam  Vitals reviewed. Constitutional: She is oriented to person, place, and time. She appears well-nourished.  Cardiovascular: Normal rate, regular rhythm, normal heart sounds and intact distal pulses.   Pulmonary/Chest: Effort normal and breath sounds normal.  Abdominal: Soft. Bowel sounds are normal.  Musculoskeletal: She exhibits no edema and no tenderness.  Pain with bilateral internal and external hip rotation  Neurological: She is alert and oriented to person, place, and time. She has normal reflexes.  Skin: Skin is warm and dry.  Psychiatric: Thought content normal.    Lab Results  Component Value Date   WBC 4.8 10/09/2013   HGB 12.8 10/09/2013   HCT 37.3 10/09/2013   MCV 92.3 10/09/2013   PLT 240 10/09/2013   Lab Results  Component Value Date   CREATININE 0.74 10/09/2013   BUN 13 10/09/2013   NA 140 10/09/2013   K 4.1 10/09/2013   CL 104 10/09/2013   CO2 26 10/09/2013    No results found for this basename: HGBA1C   Lipid Panel  No results found for this basename: chol, trig, hdl, cholhdl, vldl, ldlcalc       Assessment and plan:   Karen Baldwin was seen today for hip pain.  Diagnoses and associated orders for this visit:  Back pain - Ambulatory referral to Orthopedic Surgery - meloxicam (MOBIC) 15 MG tablet; Take 1 tablet (15 mg total) by mouth daily. - cyclobenzaprine (FLEXERIL) 10 MG tablet; Take 1 tablet (10 mg total) by mouth 3 (three) times daily as needed for muscle spasms.  Return if symptoms worsen or fail to improve.      Ambrose FinlandValerie A Tyonna Talerico, NP-C [facility] [phone] 12/15/2013, 8:02 PM

## 2013-12-12 NOTE — Progress Notes (Signed)
Pt is here for left sided hip pain onset 3 weeks Reports she was seen at [facility] ER; given Oxycodone Needing pain meds Reports pain increases w/activity and radiates down to left foot

## 2013-12-18 ENCOUNTER — Encounter (HOSPITAL_COMMUNITY): Payer: Self-pay | Admitting: Emergency Medicine

## 2013-12-18 ENCOUNTER — Emergency Department (HOSPITAL_COMMUNITY)
Admission: EM | Admit: 2013-12-18 | Discharge: 2013-12-18 | Disposition: A | Discharge status: Home / Self Care | Payer: No Typology Code available for payment source | Attending: Emergency Medicine | Admitting: Emergency Medicine

## 2013-12-18 DIAGNOSIS — F329 Major depressive disorder, single episode, unspecified: Secondary | ICD-10-CM | POA: Insufficient documentation

## 2013-12-18 DIAGNOSIS — M79609 Pain in unspecified limb: Secondary | ICD-10-CM | POA: Insufficient documentation

## 2013-12-18 DIAGNOSIS — J4489 Other specified chronic obstructive pulmonary disease: Secondary | ICD-10-CM | POA: Insufficient documentation

## 2013-12-18 DIAGNOSIS — Z880 Allergy status to penicillin: Secondary | ICD-10-CM | POA: Insufficient documentation

## 2013-12-18 DIAGNOSIS — G8929 Other chronic pain: Secondary | ICD-10-CM | POA: Insufficient documentation

## 2013-12-18 DIAGNOSIS — Z87442 Personal history of urinary calculi: Secondary | ICD-10-CM | POA: Insufficient documentation

## 2013-12-18 DIAGNOSIS — J449 Chronic obstructive pulmonary disease, unspecified: Secondary | ICD-10-CM | POA: Insufficient documentation

## 2013-12-18 DIAGNOSIS — R350 Frequency of micturition: Secondary | ICD-10-CM | POA: Insufficient documentation

## 2013-12-18 DIAGNOSIS — F3289 Other specified depressive episodes: Secondary | ICD-10-CM | POA: Insufficient documentation

## 2013-12-18 DIAGNOSIS — Z8619 Personal history of other infectious and parasitic diseases: Secondary | ICD-10-CM | POA: Insufficient documentation

## 2013-12-18 DIAGNOSIS — F411 Generalized anxiety disorder: Secondary | ICD-10-CM | POA: Insufficient documentation

## 2013-12-18 DIAGNOSIS — Z8701 Personal history of pneumonia (recurrent): Secondary | ICD-10-CM | POA: Insufficient documentation

## 2013-12-18 DIAGNOSIS — G43909 Migraine, unspecified, not intractable, without status migrainosus: Secondary | ICD-10-CM | POA: Insufficient documentation

## 2013-12-18 DIAGNOSIS — Z791 Long term (current) use of non-steroidal anti-inflammatories (NSAID): Secondary | ICD-10-CM | POA: Insufficient documentation

## 2013-12-18 DIAGNOSIS — R209 Unspecified disturbances of skin sensation: Secondary | ICD-10-CM | POA: Insufficient documentation

## 2013-12-18 DIAGNOSIS — IMO0002 Reserved for concepts with insufficient information to code with codable children: Secondary | ICD-10-CM | POA: Insufficient documentation

## 2013-12-18 DIAGNOSIS — F17200 Nicotine dependence, unspecified, uncomplicated: Secondary | ICD-10-CM | POA: Insufficient documentation

## 2013-12-18 DIAGNOSIS — Z8781 Personal history of (healed) traumatic fracture: Secondary | ICD-10-CM | POA: Insufficient documentation

## 2013-12-18 DIAGNOSIS — Z8719 Personal history of other diseases of the digestive system: Secondary | ICD-10-CM | POA: Insufficient documentation

## 2013-12-18 DIAGNOSIS — M5416 Radiculopathy, lumbar region: Secondary | ICD-10-CM

## 2013-12-18 DIAGNOSIS — Z79899 Other long term (current) drug therapy: Secondary | ICD-10-CM | POA: Insufficient documentation

## 2013-12-18 MED ORDER — PREDNISONE 20 MG PO TABS
ORAL_TABLET | ORAL | Status: DC
Start: 2013-12-18 — End: 2014-01-01

## 2013-12-18 MED ORDER — HYDROCODONE-ACETAMINOPHEN 5-325 MG PO TABS: ORAL_TABLET | ORAL | Status: DC

## 2013-12-18 NOTE — ED Notes (Signed)
Pt c/o back pain that shoots down into buttocks and left leg x 3 weeks has been seen for this. Pt states her calf are swelling now.

## 2013-12-18 NOTE — ED Provider Notes (Signed)
CSN: [id number]     Arrival date & time 12/18/13  1216 History  This chart was scribed for non-physician practitioner working with Suzi Roots, MD, by Andrew Au, ED Scribe. This patient was seen in room WTR6/WTR6 and the patient's care was started at 12:42 PM.   Chief Complaint  Patient presents with  . Back Pain  . Leg Pain   Patient is a 43 y.o. female presenting with back pain and leg pain. The history is provided by the patient. No language interpreter was used.  Back Pain Associated symptoms: leg pain and numbness   Associated symptoms: no dysuria, no fever, no pelvic pain and no weakness   Leg Pain Associated symptoms: back pain   Associated symptoms: no fatigue and no fever    Karen Baldwin is a 43 y.o. female who presents to the Emergency Department complaining of worsening shooting lower back pain x 1 month with associated urinary frequency. Reports burning shooting pain from lower back to bilateral legs worse on left. Pt reports shooting pain worsens with walking. She reports swelling to bilateral legs and bilateral groin numbess. Pt has been told that she has been losing weight but does not weigh herself. Pt has been seen at wellness and is working on a referral to orthopedist. She states h/o back pain. Pt has not had MRI of back. She denies back surgery. Pt denies bowel incontinence. She denies fevers. 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   Past Medical History  Diagnosis Date  . Chronic bronchitis     "get it sometimes twice/yr" (07/25/2013)  . Pelvic fracture     non-surgical mgt.   . Vaginal delivery 1999  . Depression   . COPD (chronic obstructive pulmonary disease)   . Pneumonia 1979; 2004    "double";   Marland Kitchen CAP (community acquired pneumonia) 07/25/2013  . Shortness of breath     "comes on at anytime" (07/25/2013)  . GERD (gastroesophageal reflux disease)   . Hepatitis C  03/2012    diagnosed by labs done at prison.    . Migraine     "once or twice/month" (07/25/2013)  . Chronic lower back pain   . Anxiety   . Kidney stone     "passed on my own"   Past Surgical History  Procedure Laterality Date  . Cholecystectomy  2009    in [place]  . Tubal ligation  1999  . Vaginal hysterectomy  2001    "partial", for menorrhagia associated with uterine fibroids.   Marland Kitchen Hysteroscopy  1999   Family History  Problem Relation Age of Onset  . Aneurysm Father   . Cancer Other   . Diabetes Other   . Thyroid disease Other   . Irritable bowel syndrome Other   . Aneurysm Other    History  Substance Use Topics  . Smoking status: Current Every Day Smoker -- 0.50 packs/day for 15 years    Types: Cigarettes  . Smokeless tobacco: Never Used  . Alcohol Use: No   OB History   Grav Para Term Preterm Abortions TAB SAB Ect Mult Living                 Review of Systems  Constitutional: Negative for fever, chills, fatigue and unexpected weight change.  Gastrointestinal: Negative for nausea, vomiting, diarrhea and constipation.       Negative for fecal incontinence.  Genitourinary: Positive for frequency. Negative for dysuria, hematuria, flank pain, vaginal bleeding, vaginal discharge and pelvic pain.       Negative for urinary incontinence or retention.  Musculoskeletal: Positive for back pain and myalgias.  Neurological: Positive for numbness. Negative for weakness.       Denies saddle paresthesias.   Allergies  Levaquin; Azithromycin; Bactrim; Levaquin; and Penicillins  Home Medications   Prior to Admission medications   Medication Sig Start Date End Date Taking? Authorizing Provider  albuterol (PROVENTIL HFA;VENTOLIN HFA) 108 (90 BASE) MCG/ACT inhaler Inhale 2 puffs into the lungs every 6 (six) hours as needed for wheezing or shortness of breath.   Yes Historical Provider, MD  amitriptyline (ELAVIL) 50 MG tablet Take 50 mg by mouth at bedtime.   Yes  Historical Provider, MD  cyclobenzaprine (FLEXERIL) 10 MG tablet Take 1 tablet (10 mg total) by mouth 3 (three) times daily as needed for muscle spasms. 12/12/13  Yes Ambrose FinlandValerie A Keck, NP  dicyclomine (BENTYL) 20 MG tablet Take 20 mg by mouth 2 (two) times daily.    Yes Historical Provider, MD  ibuprofen (ADVIL,MOTRIN) 800 MG tablet Take 800 mg by mouth every 8 (eight) hours as needed for moderate pain.    Yes Historical Provider, MD  ipratropium (ATROVENT HFA) 17 MCG/ACT inhaler Inhale 2 puffs into the lungs every 4 (four) hours as needed for wheezing.   Yes Historical Provider, MD  loratadine (CLARITIN) 10 MG tablet Take 10 mg by mouth every morning.    Yes Historical Provider, MD  meloxicam (MOBIC) 15 MG tablet Take 1 tablet (15 mg total) by mouth daily. 12/12/13  Yes Ambrose FinlandValerie A Keck, NP  mometasone-formoterol (DULERA) 100-5 MCG/ACT AERO Inhale 2 puffs into the lungs 2 (two) times daily.   Yes Historical Provider, MD  Naphazoline-Glycerin-Zinc Sulf (CLEAR EYES SEASONAL RELIEF) 0.012-0.2-0.25 % SOLN Apply 2-3 drops to eye continuous as needed (irritation).    Yes Historical Provider, MD   BP 126/83  Pulse 97  Resp 18  SpO2 100% Physical Exam  Nursing note and vitals reviewed. Constitutional: She appears well-developed and well-nourished. No distress.  HENT:  Head: Normocephalic and atraumatic.  Eyes: Conjunctivae and EOM are normal.  Neck: Normal range of motion. Neck supple.  Cardiovascular: Normal rate.   Pulmonary/Chest: Effort normal.  Abdominal: Soft. There is no tenderness. There is no CVA tenderness.  Musculoskeletal: Normal range of motion.  No step-off noted with palpation of spine. No appreciable swelling of calves.   Neurological: She is alert. She has normal strength and normal reflexes. No sensory deficit.  5/5 strength in entire lower extremities bilaterally. No sensation deficit. No foot drop with ambulation.  Skin: Skin is warm and dry. No rash noted.  Psychiatric: She has a  normal mood and affect. Her behavior is normal.    ED Course  Procedures  1:01 PM-Discussed treatment plan which includes pain medication and prednisone with pt at bedside and pt agreed to plan. Patient encouraged to continue previously prescribed Flexeril and Mobic. Will give neurosurgery referral.  Labs Review Labs Reviewed - No data to display  Imaging Review No results found.   EKG Interpretation None     Filed Vitals:   12/18/13 1220  BP: 126/83  Pulse: 97  Resp: 18  SpO2: 100%   Patient seen and examined.   Vital signs reviewed and are as follows: Filed Vitals:   12/18/13 1220  BP: 126/83  Pulse: 97  Resp: 18   No red flag s/s  of low back pain. Patient was counseled on back pain precautions and told to do activity as tolerated but do not lift, push, or pull heavy objects more than 10 pounds for the next week.  Patient counseled to use ice or heat on back for no longer than 15 minutes every hour.   Patient prescribed narcotic pain medicine and counseled on proper use of narcotic pain medications. Counseled not to combine this medication with others containing tylenol.   Urged patient not to drink alcohol, drive, or perform any other activities that requires focus while taking these medications.  Patient urged to follow-up with PCP if pain does not improve with treatment and rest or if pain becomes recurrent. Urged to return with worsening severe pain, loss of bowel or bladder control, trouble walking.   The patient verbalizes understanding and agrees with the plan.    MDM   Final diagnoses:  Lumbar radiculopathy   Patient with back pain, signs of lumbar radiculopathy. I suspect that she has nerve root compression. She will need MRI near future. She has no red flag signs and symptoms today which would necessitate MRI at this time. No neurological deficits. Patient is ambulatory. No warning symptoms of back pain including: loss of bowel or bladder control, night  sweats, waking from sleep with back pain, unexplained fevers or documented weight loss, h/o cancer, IVDU, recent trauma. No hyperreflexia. No concern for cauda equina, epidural abscess, or other serious cause of back pain. Do not suspect central cord syndrome. Conservative measures such as rest, ice/heat and pain medicine indicated. I've given neurosurgical referral as patient states that her previous orthopedic referral was not able to see her because of her type of insurance. Hopefully this will expedite her evaluation.  I personally performed the services described in this documentation, which was scribed in my presence. The recorded information has been reviewed and is accurate.     Renne Crigler, PA-C 12/18/13 1323

## 2013-12-18 NOTE — ED Notes (Signed)
Initial Contact - pt sitting up in chair, pt reports c/o 8/10 pain from low back shooting into LLE more so than RLE, also c//o numbness to BLE x1 mo.  Pt also reports BLE swelling x1 week, 1+ BLE edema noted.  Pt reports pain worse with movement/walking.  Pt moving extremities independently.  Skin PWD.  Speaking full/clear sentences.  NAD.

## 2013-12-18 NOTE — Discharge Instructions (Signed)
Please read and follow all provided instructions.  Your diagnoses today include:  1. Lumbar radiculopathy     Tests performed today include:  Vital signs - see below for your results today  Medications prescribed:   Vicodin (hydrocodone/acetaminophen) - narcotic pain medication  DO NOT drive or perform any activities that require you to be awake and alert because this medicine can make you drowsy. BE VERY CAREFUL not to take multiple medicines containing Tylenol (also called acetaminophen). Doing so can lead to an overdose which can damage your liver and cause liver failure and possibly death.   Prednisone - steroid medicine   It is best to take this medication in the morning to prevent sleeping problems. If you are diabetic, monitor your blood sugar closely and stop taking Prednisone if blood sugar is over 300. Take with food to prevent stomach upset.   Take any prescribed medications only as directed.  Home care instructions:   Follow any educational materials contained in this packet  Please rest, use ice or heat on your back for the next several days  Do not lift, push, pull anything more than 10 pounds for the next week  Follow-up instructions: Please follow-up with your primary care provider or the neurosurgery referral in the next 1 week for further evaluation of your symptoms. If you do not have a primary care doctor -- see below for referral information.   Return instructions:  SEEK IMMEDIATE MEDICAL ATTENTION IF YOU HAVE:  New numbness, tingling, weakness, or problem with the use of your arms or legs  Severe back pain not relieved with medications  Loss control of your bowels or bladder  Increasing pain in any areas of the body (such as chest or abdominal pain)  Shortness of breath, dizziness, or fainting.   Worsening nausea (feeling sick to your stomach), vomiting, fever, or sweats  Any other emergent concerns regarding your health   Additional  Information:  Your vital signs today were: BP 126/83   Pulse 97   Resp 18   SpO2 100% If your blood pressure (BP) was elevated above 135/85 this visit, please have this repeated by your doctor within one month. --------------

## 2013-12-27 NOTE — ED Provider Notes (Signed)
Medical screening examination/treatment/procedure(s) were performed by non-physician practitioner and as supervising physician I was immediately available for consultation/collaboration.     Suzi RootsKevin E Mintie Witherington, MD 12/27/13 [phone]

## 2014-01-01 ENCOUNTER — Encounter (HOSPITAL_COMMUNITY): Payer: Self-pay | Admitting: Emergency Medicine

## 2014-01-01 ENCOUNTER — Emergency Department (INDEPENDENT_AMBULATORY_CARE_PROVIDER_SITE_OTHER)
Admission: EM | Admit: 2014-01-01 | Discharge: 2014-01-01 | Disposition: A | Discharge status: Home / Self Care | Payer: No Typology Code available for payment source | Source: Home / Self Care | Attending: Family Medicine | Admitting: Family Medicine

## 2014-01-01 DIAGNOSIS — M5432 Sciatica, left side: Secondary | ICD-10-CM

## 2014-01-01 DIAGNOSIS — R000 Tachycardia, unspecified: Secondary | ICD-10-CM

## 2014-01-01 DIAGNOSIS — M5430 Sciatica, unspecified side: Secondary | ICD-10-CM

## 2014-01-01 LAB — POCT I-STAT, CHEM 8
BUN: 7 mg/dL (ref 6–23)
CALCIUM ION: 1.21 mmol/L (ref 1.12–1.23)
CHLORIDE: 104 meq/L (ref 96–112)
Creatinine, Ser: 0.7 mg/dL (ref 0.50–1.10)
GLUCOSE: 91 mg/dL (ref 70–99)
HCT: 41 % (ref 36.0–46.0)
Hemoglobin: 13.9 g/dL (ref 12.0–15.0)
Potassium: 3.6 mEq/L — ABNORMAL LOW (ref 3.7–5.3)
Sodium: 140 mEq/L (ref 137–147)
TCO2: 23 mmol/L (ref 0–100)

## 2014-01-01 LAB — CBC
HCT: 38.1 % (ref 36.0–46.0)
Hemoglobin: 12.7 g/dL (ref 12.0–15.0)
MCH: 31 pg (ref 26.0–34.0)
MCHC: 33.3 g/dL (ref 30.0–36.0)
MCV: 92.9 fL (ref 78.0–100.0)
Platelets: 307 10*3/uL (ref 150–400)
RBC: 4.1 MIL/uL (ref 3.87–5.11)
RDW: 13.2 % (ref 11.5–15.5)
WBC: 6.3 10*3/uL (ref 4.0–10.5)

## 2014-01-01 LAB — D-DIMER, QUANTITATIVE: D-Dimer, Quant: <0.27 ug/mL-FEU (ref 0.00–0.48)

## 2014-01-01 MED ORDER — HYDROCODONE-ACETAMINOPHEN 5-325 MG PO TABS: 1.0000 | ORAL_TABLET | Freq: Four times a day (QID) | ORAL | Status: DC | PRN

## 2014-01-01 NOTE — ED Notes (Signed)
C/o  Left leg pain.  States pain starts in left lower back radiating down left leg.  On set 6 wks ago.  States gradually worse over the past two weeks.  No relief with otc meds.

## 2014-01-01 NOTE — Discharge Instructions (Signed)
Thank you for coming in today. I will call you with the results of your blood tests.  Follow up with Dr. Katrinka BlazingSmith Monday at 2pm.  Call or go to the emergency room if you get worse, have trouble breathing, have chest pains, or palpitations.  Come back or go to the emergency room if you notice new weakness new numbness problems walking or bowel or bladder problems.  Sciatica Sciatica is pain, weakness, numbness, or tingling along the path of the sciatic nerve. The nerve starts in the lower back and runs down the back of each leg. The nerve controls the muscles in the lower leg and in the back of the knee, while also providing sensation to the back of the thigh, lower leg, and the sole of your foot. Sciatica is a symptom of another medical condition. For instance, nerve damage or certain conditions, such as a herniated disk or bone spur on the spine, pinch or put pressure on the sciatic nerve. This causes the pain, weakness, or other sensations normally associated with sciatica. Generally, sciatica only affects one side of the body. CAUSES   Herniated or slipped disc.  Degenerative disk disease.  A pain disorder involving the narrow muscle in the buttocks (piriformis syndrome).  Pelvic injury or fracture.  Pregnancy.  Tumor (rare). SYMPTOMS  Symptoms can vary from mild to very severe. The symptoms usually travel from the low back to the buttocks and down the back of the leg. Symptoms can include:  Mild tingling or dull aches in the lower back, leg, or hip.  Numbness in the back of the calf or sole of the foot.  Burning sensations in the lower back, leg, or hip.  Sharp pains in the lower back, leg, or hip.  Leg weakness.  Severe back pain inhibiting movement. These symptoms may get worse with coughing, sneezing, laughing, or prolonged sitting or standing. Also, being overweight may worsen symptoms. DIAGNOSIS  Your caregiver will perform a physical exam to look for common symptoms of  sciatica. He or she may ask you to do certain movements or activities that would trigger sciatic nerve pain. Other tests may be performed to find the cause of the sciatica. These may include:  Blood tests.  X-rays.  Imaging tests, such as an MRI or CT scan. TREATMENT  Treatment is directed at the cause of the sciatic pain. Sometimes, treatment is not necessary and the pain and discomfort goes away on its own. If treatment is needed, your caregiver may suggest:  Over-the-counter medicines to relieve pain.  Prescription medicines, such as anti-inflammatory medicine, muscle relaxants, or narcotics.  Applying heat or ice to the painful area.  Steroid injections to lessen pain, irritation, and inflammation around the nerve.  Reducing activity during periods of pain.  Exercising and stretching to strengthen your abdomen and improve flexibility of your spine. Your caregiver may suggest losing weight if the extra weight makes the back pain worse.  Physical therapy.  Surgery to eliminate what is pressing or pinching the nerve, such as a bone spur or part of a herniated disk. HOME CARE INSTRUCTIONS   Only take over-the-counter or prescription medicines for pain or discomfort as directed by your caregiver.  Apply ice to the affected area for 20 minutes, 3-4 times a day for the first 48-72 hours. Then try heat in the same way.  Exercise, stretch, or perform your usual activities if these do not aggravate your pain.  Attend physical therapy sessions as directed by your caregiver.  Keep all follow-up appointments as directed by your caregiver.  Do not wear high heels or shoes that do not provide proper support.  Check your mattress to see if it is too soft. A firm mattress may lessen your pain and discomfort. SEEK IMMEDIATE MEDICAL CARE IF:   You lose control of your bowel or bladder (incontinence).  You have increasing weakness in the lower back, pelvis, buttocks, or legs.  You have  redness or swelling of your back.  You have a burning sensation when you urinate.  You have pain that gets worse when you lie down or awakens you at night.  Your pain is worse than you have experienced in the past.  Your pain is lasting longer than 4 weeks.  You are suddenly losing weight without reason. MAKE SURE YOU:  Understand these instructions.  Will watch your condition.  Will get help right away if you are not doing well or get worse. Document Released: 06/21/2001 Document Revised: 12/27/2011 Document Reviewed: 11/06/2011 [facility] Patient Information 2015 [employer] [place]. This information is not intended to replace advice given to you by your health care provider. Make sure you discuss any questions you have with your health care provider.

## 2014-01-01 NOTE — ED Provider Notes (Signed)
Karen Baldwin is a 43 y.o. female who presents to Urgent Care today for left leg pain. Patient has had 6 weeks of left back pain radiating to the left leg. She denies any numbness but does note subjective mild weakness. She denies any bowel bladder dysfunction. The pain is severe and worse with activity. She has tried over-the-counter medications as well as prescriptions from the emergency room. She denies any chest pains palpitations or shortness of breath. She denies any new leg swelling. She feels well otherwise.   Past Medical History  Diagnosis Date  . Chronic bronchitis     "get it sometimes twice/yr" (07/25/2013)  . Pelvic fracture     non-surgical mgt.   . Vaginal delivery 1999  . Depression   . COPD (chronic obstructive pulmonary disease)   . Pneumonia 1979; 2004    "double";   Marland Kitchen. CAP (community acquired pneumonia) 07/25/2013  . Shortness of breath     "comes on at anytime" (07/25/2013)  . GERD (gastroesophageal reflux disease)   . Hepatitis C 03/2012    diagn[facility].    . Migraine     "once or twice/month" (07/25/2013)  . Chronic lower back pain   . Anxiety   . Kidney stone     "passed on my own"   History  Substance Use Topics  . Smoking status: Current Every Day Smoker -- 0.50 packs/day for 15 years    Types: Cigarettes  . Smokeless tobacco: Never Used  . Alcohol Use: No   ROS as above Medications: No current facility-administered medications for this encounter.   Current Outpatient Prescriptions  Medication Sig Dispense Refill  . amitriptyline (ELAVIL) 50 MG tablet Take 50 mg by mouth at bedtime.      . cyclobenzaprine (FLEXERIL) 10 MG tablet Take 1 tablet (10 mg total) by mouth 3 (three) times daily as needed for muscle spasms.  30 tablet  2  . dicyclomine (BENTYL) 20 MG tablet Take 20 mg by mouth 2 (two) times daily.       Marland Kitchen. loratadine (CLARITIN) 10 MG tablet Take 10 mg by mouth every morning.       Marland Kitchen. albuterol (PROVENTIL HFA;VENTOLIN HFA) 108  (90 BASE) MCG/ACT inhaler Inhale 2 puffs into the lungs every 6 (six) hours as needed for wheezing or shortness of breath.      Marland Kitchen. HYDROcodone-acetaminophen (NORCO/VICODIN) 5-325 MG per tablet Take 1 tablet by mouth every 6 (six) hours as needed.  15 tablet  0  . ibuprofen (ADVIL,MOTRIN) 800 MG tablet Take 800 mg by mouth every 8 (eight) hours as needed for moderate pain.       Marland Kitchen. ipratropium (ATROVENT HFA) 17 MCG/ACT inhaler Inhale 2 puffs into the lungs every 4 (four) hours as needed for wheezing.      . mometasone-formoterol (DULERA) 100-5 MCG/ACT AERO Inhale 2 puffs into the lungs 2 (two) times daily.      . Naphazoline-Glycerin-Zinc Sulf (CLEAR EYES SEASONAL RELIEF) 0.012-0.2-0.25 % SOLN Apply 2-3 drops to eye continuous as needed (irritation).         Exam:  BP 141/92  Pulse 118  Temp(Src) 97.8 F (36.6 C) (Oral)  Resp 16  SpO2 97% Gen: Well NAD HEENT: EOMI,  MMM Lungs: Normal work of breathing. CTABL Heart: RRR no MRG Abd: NABS, Soft. NT, ND Exts: Brisk capillary refill, warm and well perfused.  Left leg: Normal-appearing no swelling or edema. Nontender. Pulses capillary refill sensation intact distally. Back: Nontender spinal midline.  Decreased range of motion. Lower extremity strength is intact bilaterally. Reflexes are intact throughout.  12 lead EKG: Normal sinus rhythm at 99 beats per minute. No ST segment elevation or depression. Normal T waves.  Results for orders placed during the hospital encounter of 01/01/14 (from the past 24 hour(s))  CBC     Status: None   Collection Time    01/01/14 11:51 AM      Result Value Ref Range   WBC 6.3  4.0 - 10.5 K/uL   RBC 4.10  3.87 - 5.11 MIL/uL   Hemoglobin 12.7  12.0 - 15.0 g/dL   HCT 16.138.1  09.636.0 - 04.546.0 %   MCV 92.9  78.0 - 100.0 fL   MCH 31.0  26.0 - 34.0 pg   MCHC 33.3  30.0 - 36.0 g/dL   RDW 40.913.2  81.111.5 - 91.415.5 %   Platelets 307  150 - 400 K/uL  D-DIMER, QUANTITATIVE     Status: None   Collection Time    01/01/14 11:51 AM       Result Value Ref Range   D-Dimer, Quant <0.27  0.00 - 0.48 ug/mL-FEU  POCT I-STAT, CHEM 8     Status: Abnormal   Collection Time    01/01/14 12:33 PM      Result Value Ref Range   Sodium 140  137 - 147 mEq/L   Potassium 3.6 (*) 3.7 - 5.3 mEq/L   Chloride 104  96 - 112 mEq/L   BUN 7  6 - 23 mg/dL   Creatinine, Ser 7.820.70  0.50 - 1.10 mg/dL   Glucose, Bld 91  70 - 99 mg/dL   Calcium, Ion 9.561.21  2.131.12 - 1.23 mmol/L   TCO2 23  0 - 100 mmol/L   Hemoglobin 13.9  12.0 - 15.0 g/dL   HCT 08.641.0  57.836.0 - 46.946.0 %   No results found.  Assessment and Plan: 43 y.o. female with left leg sciatica. Low probability for DVT/PE given normal d-dimer and normal rate on EKG. Referred to sports medicine for evaluation and management. Suggest MRI for left leg sciatica. Small amount of Norco for pain control  Discussed warning signs or symptoms. Please see discharge instructions. Patient expresses understanding.    Rodolph BongEvan S Corey, MD 01/01/14 [id number]

## 2014-01-02 ENCOUNTER — Ambulatory Visit: Payer: No Typology Code available for payment source | Admitting: Internal Medicine

## 2014-01-06 ENCOUNTER — Ambulatory Visit (INDEPENDENT_AMBULATORY_CARE_PROVIDER_SITE_OTHER): Payer: No Typology Code available for payment source | Admitting: Family Medicine

## 2014-01-06 ENCOUNTER — Encounter: Payer: Self-pay | Admitting: Family Medicine

## 2014-01-06 ENCOUNTER — Encounter: Payer: Self-pay | Admitting: *Deleted

## 2014-01-06 VITALS — BP 136/86 | HR 97 | Ht 68.5 in | Wt 197.0 lb

## 2014-01-06 DIAGNOSIS — M5417 Radiculopathy, lumbosacral region: Secondary | ICD-10-CM

## 2014-01-06 DIAGNOSIS — IMO0002 Reserved for concepts with insufficient information to code with codable children: Secondary | ICD-10-CM

## 2014-01-06 DIAGNOSIS — M5416 Radiculopathy, lumbar region: Secondary | ICD-10-CM

## 2014-01-06 MED ORDER — TIZANIDINE HCL 4 MG PO CAPS: 4.0000 mg | ORAL_CAPSULE | Freq: Three times a day (TID) | ORAL | Status: DC

## 2014-01-06 MED ORDER — KETOROLAC TROMETHAMINE 60 MG/2ML IM SOLN
60.0000 mg | Freq: Once | INTRAMUSCULAR | Status: AC
  Administered 2014-01-06: 60 mg via INTRAMUSCULAR

## 2014-01-06 MED ORDER — METHYLPREDNISOLONE ACETATE 80 MG/ML IJ SUSP
80.0000 mg | Freq: Once | INTRAMUSCULAR | Status: AC
  Administered 2014-01-06: 80 mg via INTRAMUSCULAR

## 2014-01-06 MED ORDER — GABAPENTIN 300 MG PO CAPS: ORAL_CAPSULE | ORAL | Status: DC

## 2014-01-06 NOTE — Patient Instructions (Addendum)
Good to meet you Ice 20 minutes 3 times a day Two shots today that will help Exercises 3 times a week to daily starting in 2 days.  Try the ibuprofen 3 times daily for next 6 days.  New muscle relaxor given to try Would not get valium probably unless you need it and DO NOT TAKE MUSCLE RELAXER.  Gabapentin at night Good shoes like Lelon FrohlichKeen, Merrell, Dansko or new balance.  MRI ordered and come back 1-2 days after MRI.

## 2014-01-06 NOTE — Assessment & Plan Note (Signed)
Patient's physical exam and history does correspond well with an S1 nerve root impingement. Patient's x-ray does show some osteophytic changes that could be contributing. Patient is having intractable pain and now does have weakness on exam as well as a chronic numbness she states. I do believe that further imaging is warranted at this time with her having greater than 6 weeks of care without any significant improvement and if anything worsening. Patient will have MRI done. Patient was given an injection of Toradol as well as Depo-Medrol today. Patient given a new muscle relaxer as well as start gabapentin at night. Pacing protocol given we discussed home exercise program. We discussed proper shoes as well. Patient will follow up 1-2 days after the MRI for further evaluation and treatment.

## 2014-01-06 NOTE — Progress Notes (Signed)
Tawana ScaleZach Velita Quirk D.O. [facility] [address] [place], [place] [address] Phone: [phone] Subjective:    I'm seeing this patient by the request  of:  Dr. Denyse Amassorey  CC: Back pain  BJY:NWGNFAOZHYHPI:Subjective Karen Baldwin is a 43 y.o. female coming in with complaint of back pain. Patient has been seen in the emergency department on multiple occasions. Patient started having the leg and back pain it appears and a 2015. Patient recently was seen in the urgent care and was diagnosed with a sciatica. Patient did have x-rays done which were reviewed by me. 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  States that she continues to have the pain. Patient states it always starts in the low back on the left side and radiates down the posterior aspect of her leg. Patient states it seems to be getting worse and on pain medications seems to be helping. Patient denies any weakness but states that there is now a numbness and seems to be constant. Patient is finding it difficult though to sleep at night. Patient has found it difficult to actually raise her leg while she goes up stairs secondary to pain. Patient that the severity of 10 out of 10. This is waking her up at night. Patient does give a past medical history significant for a fall and this can be seen in the emergency department visits may 21st 2015.    Past medical history, social, surgical and family history all reviewed in electronic medical record.   Review of Systems: No headache, visual changes, nausea, vomiting, diarrhea, constipation, dizziness, abdominal pain, skin rash, fevers, chills, night sweats, weight loss, swollen lymph nodes, body aches, joint swelling, muscle aches, chest pain, shortness of breath, mood changes.   Objective Blood pressure 136/86, pulse 97, height 5' 8.5" (1.74 m), weight 197 lb (89.359 kg), SpO2 96.00%.  General: No apparent distress alert and  oriented x3 mood and affect normal, dressed appropriately. Patient does no seem in pain. HEENT: Pupils equal, extraocular movements intact  Respiratory: Patient's speak in full sentences and does not appear short of breath  Cardiovascular: No lower extremity edema, non tender, no erythema  Skin: Warm dry intact with no signs of infection or rash on extremities or on axial skeleton.  Abdomen: Soft nontender  Neuro: Cranial nerves II through XII are intact, neurovascularly intact in all extremities with 2+ DTRs and 2+ pulses.  Lymph: No lymphadenopathy of posterior or anterior cervical chain or axillae bilaterally.  Gait antalgic.  MSK:  Non tender with full range of motion and good stability and symmetric strength and tone of shoulders, elbows, wrist, hip, knee and ankles bilaterally.  Back Exam:  Inspection: Unremarkable  Motion: Flexion 35 deg, Extension 35 deg, Side Bending to 25 deg bilaterally,  Rotation to 25 deg bilaterally  SLR laying: Positive left side  XSLR laying: Negative  Palpable tenderness: Tender to palpation over the left SI joint as well as the L5 paraspinal musculature. FABER: negative. Sensory change: Gross sensation intact to all lumbar and sacral dermatomes.  Reflexes: 2+ at both patellar tendons, 2+ at achilles tendons, Babinski's downgoing.  Strength at foot  Plantar-flexion: 4/5 on the left compared to full-strength of right Dorsi-flexion: 5/5 Eversion: 5/5 Inversion: 5/5  Leg strength  Quad: 5/5 Hamstring: 5/5 Hip flexor: 5/5 Hip abductors: 5/5       Impression and Recommendations:     This case required medical decision making  of moderate complexity.

## 2014-01-11 ENCOUNTER — Encounter (HOSPITAL_COMMUNITY): Payer: Self-pay | Admitting: Emergency Medicine

## 2014-01-11 ENCOUNTER — Emergency Department (HOSPITAL_COMMUNITY)
Admission: EM | Admit: 2014-01-11 | Discharge: 2014-01-11 | Disposition: A | Discharge status: Home / Self Care | Payer: No Typology Code available for payment source | Attending: Emergency Medicine | Admitting: Emergency Medicine

## 2014-01-11 DIAGNOSIS — G8929 Other chronic pain: Secondary | ICD-10-CM

## 2014-01-11 DIAGNOSIS — F17200 Nicotine dependence, unspecified, uncomplicated: Secondary | ICD-10-CM | POA: Insufficient documentation

## 2014-01-11 DIAGNOSIS — G43909 Migraine, unspecified, not intractable, without status migrainosus: Secondary | ICD-10-CM | POA: Insufficient documentation

## 2014-01-11 DIAGNOSIS — Z8781 Personal history of (healed) traumatic fracture: Secondary | ICD-10-CM | POA: Insufficient documentation

## 2014-01-11 DIAGNOSIS — IMO0002 Reserved for concepts with insufficient information to code with codable children: Secondary | ICD-10-CM | POA: Insufficient documentation

## 2014-01-11 DIAGNOSIS — Z8709 Personal history of other diseases of the respiratory system: Secondary | ICD-10-CM | POA: Insufficient documentation

## 2014-01-11 DIAGNOSIS — J4489 Other specified chronic obstructive pulmonary disease: Secondary | ICD-10-CM | POA: Insufficient documentation

## 2014-01-11 DIAGNOSIS — Z8719 Personal history of other diseases of the digestive system: Secondary | ICD-10-CM | POA: Insufficient documentation

## 2014-01-11 DIAGNOSIS — Z8701 Personal history of pneumonia (recurrent): Secondary | ICD-10-CM | POA: Insufficient documentation

## 2014-01-11 DIAGNOSIS — F3289 Other specified depressive episodes: Secondary | ICD-10-CM | POA: Insufficient documentation

## 2014-01-11 DIAGNOSIS — M545 Low back pain, unspecified: Secondary | ICD-10-CM | POA: Insufficient documentation

## 2014-01-11 DIAGNOSIS — M549 Dorsalgia, unspecified: Secondary | ICD-10-CM | POA: Insufficient documentation

## 2014-01-11 DIAGNOSIS — Z79899 Other long term (current) drug therapy: Secondary | ICD-10-CM | POA: Insufficient documentation

## 2014-01-11 DIAGNOSIS — Z87442 Personal history of urinary calculi: Secondary | ICD-10-CM | POA: Insufficient documentation

## 2014-01-11 DIAGNOSIS — F411 Generalized anxiety disorder: Secondary | ICD-10-CM | POA: Insufficient documentation

## 2014-01-11 DIAGNOSIS — Z880 Allergy status to penicillin: Secondary | ICD-10-CM | POA: Insufficient documentation

## 2014-01-11 DIAGNOSIS — Z791 Long term (current) use of non-steroidal anti-inflammatories (NSAID): Secondary | ICD-10-CM | POA: Insufficient documentation

## 2014-01-11 DIAGNOSIS — M5442 Lumbago with sciatica, left side: Secondary | ICD-10-CM

## 2014-01-11 DIAGNOSIS — J449 Chronic obstructive pulmonary disease, unspecified: Secondary | ICD-10-CM | POA: Insufficient documentation

## 2014-01-11 DIAGNOSIS — F329 Major depressive disorder, single episode, unspecified: Secondary | ICD-10-CM | POA: Insufficient documentation

## 2014-01-11 MED ORDER — LIDOCAINE 5 % EX PTCH: 1.0000 | MEDICATED_PATCH | CUTANEOUS | Status: DC

## 2014-01-11 MED ORDER — LIDOCAINE 5 % EX PTCH
1.0000 | MEDICATED_PATCH | CUTANEOUS | Status: DC
  Administered 2014-01-11: 1 via TRANSDERMAL
  Filled 2014-01-11: qty 1

## 2014-01-11 MED ORDER — METHOCARBAMOL 500 MG PO TABS
500.0000 mg | ORAL_TABLET | Freq: Once | ORAL | Status: AC
  Administered 2014-01-11: 500 mg via ORAL
  Filled 2014-01-11: qty 1

## 2014-01-11 MED ORDER — METHOCARBAMOL 500 MG PO TABS: 500.0000 mg | ORAL_TABLET | Freq: Two times a day (BID) | ORAL | Status: DC

## 2014-01-11 MED ORDER — DICLOFENAC SODIUM 50 MG PO TBEC: 50.0000 mg | DELAYED_RELEASE_TABLET | Freq: Two times a day (BID) | ORAL | Status: DC

## 2014-01-11 MED ORDER — PREDNISONE 20 MG PO TABS: 40.0000 mg | ORAL_TABLET | Freq: Every day | ORAL | Status: DC

## 2014-01-11 NOTE — ED Notes (Signed)
C/o low back pain radiating to l/leg. Reports numbness and tingling to l/leg. Recent dx of sciatic pain

## 2014-01-11 NOTE — ED Provider Notes (Signed)
CSN: [id number]     Arrival date & time 01/11/14  1314 History  This chart was scribed for non-physician practitioner, Francee PiccoloJennifer Aiva Miskell, PA-C,working with Gilda Creasehristopher J. Pollina, MD, by Karle PlumberJennifer Tensley, ED Scribe.  This patient was seen in room WTR6/WTR6 and the patient's care was started at 1:40 PM.  Chief Complaint  Patient presents with  . Back Pain    increased low back pain, recent dx of sciatic pain   The history is provided by the patient. No language interpreter was used.   HPI Comments:  Karen Baldwin is a 43 y.o. female who presents to the Emergency Department complaining of severe lower back pain that radiates down her left leg that started yesterday afternoon. She states she "almost" fell a couple of days ago that she thinks may have aggravated the pain. She reports soreness in her right leg as well. She reports taking Gabapentin and Zanaflex at home for pain with no relief. She denies fever, bowel or bladder incontinence, h/o cancer or IV drug use. Pt states she has not been able to follow up with neurosurgery since having an MRI done secondary to cost.  Past Medical History  Diagnosis Date  . Chronic bronchitis     "get it sometimes twice/yr" (07/25/2013)  . Pelvic fracture     non-surgical mgt.   . Vaginal delivery 1999  . Depression   . COPD (chronic obstructive pulmonary disease)   . Pneumonia 1979; 2004    "double";   Marland Kitchen. CAP (community acquired pneumonia) 07/25/2013  . Shortness of breath     "comes on at anytime" (07/25/2013)  . GERD (gastroesophageal reflux disease)   . Hepatitis C 03/2012    diagn[facility].    . Migraine     "once or twice/month" (07/25/2013)  . Chronic lower back pain   . Anxiety   . Kidney stone     "passed on my own"   Past Surgical History  Procedure Laterality Date  . [place]  . Tubal ligation  1999  . Vaginal hysterectomy  2001    "partial", for menorrhagia associated with uterine  fibroids.   Marland Kitchen. Hysteroscopy  1999   Family History  Problem Relation Age of Onset  . Aneurysm Father   . Cancer Other   . Diabetes Other   . Thyroid disease Other   . Irritable bowel syndrome Other   . Aneurysm Other    History  Substance Use Topics  . Smoking status: Current Every Day Smoker -- 0.50 packs/day for 15 years    Types: Cigarettes  . Smokeless tobacco: Never Used  . Alcohol Use: No   OB History   Grav Para Term Preterm Abortions TAB SAB Ect Mult Living                 Review of Systems  Constitutional: Negative for fever.  Musculoskeletal: Positive for back pain.  All other systems reviewed and are negative.   Allergies  Azithromycin; Levaquin; Bactrim; and Penicillins  Home Medications   Prior to Admission medications   Medication Sig Start Date End Date Taking? Authorizing Provider  albuterol (PROVENTIL HFA;VENTOLIN HFA) 108 (90 BASE) MCG/ACT inhaler Inhale 2 puffs into the lungs every 6 (six) hours as needed for wheezing or shortness of breath.   Yes Historical Provider, MD  amitriptyline (ELAVIL) 50 MG tablet Take 50 mg by mouth at bedtime.   Yes Historical Provider, MD  dicyclomine (BENTYL)  20 MG tablet Take 20 mg by mouth 2 (two) times daily.    Yes Historical Provider, MD  gabapentin (NEURONTIN) 300 MG capsule Take 300 mg by mouth at bedtime.   Yes Historical Provider, MD  ibuprofen (ADVIL,MOTRIN) 800 MG tablet Take 800 mg by mouth every 8 (eight) hours as needed for moderate pain.    Yes Historical Provider, MD  ipratropium (ATROVENT HFA) 17 MCG/ACT inhaler Inhale 2 puffs into the lungs every 4 (four) hours as needed for wheezing.   Yes Historical Provider, MD  mometasone-formoterol (DULERA) 100-5 MCG/ACT AERO Inhale 2 puffs into the lungs 2 (two) times daily.   Yes Historical Provider, MD  tiZANidine (ZANAFLEX) 4 MG tablet Take 4 mg by mouth 3 (three) times daily.   Yes Historical Provider, MD  diclofenac (VOLTAREN) 50 MG EC tablet Take 1 tablet (50  mg total) by mouth 2 (two) times daily. 01/11/14   Abdurrahman Petersheim L Irene Mitcham, PA-C  lidocaine (LIDODERM) 5 % Place 1 patch onto the skin daily. Remove & Discard patch within 12 hours or as directed by MD 01/11/14   Lise AuerJennifer L Delvonte Berenson, PA-C  methocarbamol (ROBAXIN) 500 MG tablet Take 1 tablet (500 mg total) by mouth 2 (two) times daily. 01/11/14   Janaiyah Blackard L Jeanmarie Mccowen, PA-C  predniSONE (DELTASONE) 20 MG tablet Take 2 tablets (40 mg total) by mouth daily. 01/11/14   Arien Benincasa L Eldine Rencher, PA-C   Triage Vitals: BP 122/91  Pulse 115  Temp(Src) 99.1 F (37.3 C) (Oral)  Resp 20  Wt 197 lb (89.359 kg)  SpO2 99% Physical Exam  Nursing note and vitals reviewed. Constitutional: She is oriented to person, place, and time. She appears well-developed and well-nourished. No distress.  HENT:  Head: Normocephalic and atraumatic.  Right Ear: External ear normal.  Left Ear: External ear normal.  Nose: Nose normal.  Mouth/Throat: Oropharynx is clear and moist. No oropharyngeal exudate.  Eyes: Conjunctivae and EOM are normal. Pupils are equal, round, and reactive to light.  Neck: Normal range of motion. Neck supple.  Cardiovascular: Normal rate, regular rhythm, normal heart sounds and intact distal pulses.   Pulmonary/Chest: Effort normal and breath sounds normal. No respiratory distress.  Abdominal: Soft. There is no tenderness.  Musculoskeletal: She exhibits no edema.  Neurological: She is alert and oriented to person, place, and time. She has normal strength. No cranial nerve deficit. Gait normal. GCS eye subscore is 4. GCS verbal subscore is 5. GCS motor subscore is 6.  Sensation grossly intact.  No pronator drift.  Bilateral heel-knee-shin intact.  Skin: Skin is warm and dry. She is not diaphoretic.    ED Course  Procedures (including critical care time) DIAGNOSTIC STUDIES: Oxygen Saturation is 99% on RA, normal by my interpretation.   COORDINATION OF CARE: 1:47 PM- Will prescribe pain  medication and referred her to follow up with her PCP on Monday. Pt verbalizes understanding and agrees to plan.  Medications  lidocaine (LIDODERM) 5 % 1 patch (1 patch Transdermal Patch Applied 01/11/14 1410)  methocarbamol (ROBAXIN) tablet 500 mg (500 mg Oral Given 01/11/14 1359)    Labs Review Labs Reviewed - No data to display  Imaging Review No results found.   EKG Interpretation None      MDM   Final diagnoses:  Chronic back pain  Left-sided low back pain with left-sided sciatica    Filed Vitals:   01/11/14 1410  BP:   Pulse: 106  Temp:   Resp: 18   Afebrile, NAD, non-toxic appearing, AAOx4.  Patient with back pain.  No neurological deficits and normal neuro exam.  Patient can walk but states is painful.  No loss of bowel or bladder control.  No concern for cauda equina.  No fever, night sweats, weight loss, h/o cancer, IVDU.  RICE protocol and pain medicine indicated and discussed with patient.   I have reviewed records of multiple ED visits with similar or other pain related complaints, usually with negative workups.  I feel that the pt's pain is chronic and cannot appropriately or safely treated in an emergency department setting.  I do not feel that providing narcotic pain medication is in this pt's best interest.  I have urged the pt to follow up closely with their PMD or pain specialist.  I have explicitly discussed with the pt return precautions and have reassured him that he can always be seen and evaluated in the emergency department for any condition that he feels is emergent, and that he will be given treatment as the emergency physician feels is appropriate and safe, but this may not involve the use of narcotic pain medications.  The pt was given the opportunity to voice any further questions or concerns and these were addressed to the best of my ability.  Resource list on Affordable Care Act (Pt has no insurance and no PCP) given. Discussed chronic pain  management and that the ED is not designed to treat chronic pain. Provided resource list that includes the number for Pain Management as well as information on ED chronic pain management policy. Will give pt lidoderm patch and Robaxin here today to relieve pain and prescribe Voltaren, Robaxin, Lidoderm and Prednisone for home. Discussed reasons to seek immediate care. Patient expresses understanding and agrees with plan. Patient is stable at time of discharge     I personally performed the services described in this documentation, which was scribed in my presence. The recorded information has been reviewed and is accurate.    Jeannetta Ellis, PA-C 01/11/14 1453

## 2014-01-11 NOTE — Discharge Instructions (Signed)
Please follow up with your primary care physician in 1-2 days. If you do not have one please call the [facility] number listed above. Please follow up with [facility] to schedule a follow up appointment.  Please take pain medication and/or muscle relaxants as prescribed and as needed for pain. Please do not drive on narcotic pain medication or on muscle relaxants. Please take all medications as prescribed. Please read all discharge instructions and return precautions.   Sciatica Sciatica is pain, weakness, numbness, or tingling along the path of the sciatic nerve. The nerve starts in the lower back and runs down the back of each leg. The nerve controls the muscles in the lower leg and in the back of the knee, while also providing sensation to the back of the thigh, lower leg, and the sole of your foot. Sciatica is a symptom of another medical condition. For instance, nerve damage or certain conditions, such as a herniated disk or bone spur on the spine, pinch or put pressure on the sciatic nerve. This causes the pain, weakness, or other sensations normally associated with sciatica. Generally, sciatica only affects one side of the body. CAUSES   Herniated or slipped disc.  Degenerative disk disease.  A pain disorder involving the narrow muscle in the buttocks (piriformis syndrome).  Pelvic injury or fracture.  Pregnancy.  Tumor (rare). SYMPTOMS  Symptoms can vary from mild to very severe. The symptoms usually travel from the low back to the buttocks and down the back of the leg. Symptoms can include:  Mild tingling or dull aches in the lower back, leg, or hip.  Numbness in the back of the calf or sole of the foot.  Burning sensations in the lower back, leg, or hip.  Sharp pains in the lower back, leg, or hip.  Leg weakness.  Severe back pain inhibiting movement. These symptoms may get worse with coughing, sneezing, laughing, or prolonged sitting  or standing. Also, being overweight may worsen symptoms. DIAGNOSIS  Your caregiver will perform a physical exam to look for common symptoms of sciatica. He or she may ask you to do certain movements or activities that would trigger sciatic nerve pain. Other tests may be performed to find the cause of the sciatica. These may include:  Blood tests.  X-rays.  Imaging tests, such as an MRI or CT scan. TREATMENT  Treatment is directed at the cause of the sciatic pain. Sometimes, treatment is not necessary and the pain and discomfort goes away on its own. If treatment is needed, your caregiver may suggest:  Over-the-counter medicines to relieve pain.  Prescription medicines, such as anti-inflammatory medicine, muscle relaxants, or narcotics.  Applying heat or ice to the painful area.  Steroid injections to lessen pain, irritation, and inflammation around the nerve.  Reducing activity during periods of pain.  Exercising and stretching to strengthen your abdomen and improve flexibility of your spine. Your caregiver may suggest losing weight if the extra weight makes the back pain worse.  Physical therapy.  Surgery to eliminate what is pressing or pinching the nerve, such as a bone spur or part of a herniated disk. HOME CARE INSTRUCTIONS   Only take over-the-counter or prescription medicines for pain or discomfort as directed by your caregiver.  Apply ice to the affected area for 20 minutes, 3-4 times a day for the first 48-72 hours. Then try heat in the same way.  Exercise, stretch, or perform your usual activities if these do not  aggravate your pain.  Attend physical therapy sessions as directed by your caregiver.  Keep all follow-up appointments as directed by your caregiver.  Do not wear high heels or shoes that do not provide proper support.  Check your mattress to see if it is too soft. A firm mattress may lessen your pain and discomfort. SEEK IMMEDIATE MEDICAL CARE IF:   You  lose control of your bowel or bladder (incontinence).  You have increasing weakness in the lower back, pelvis, buttocks, or legs.  You have redness or swelling of your back.  You have a burning sensation when you urinate.  You have pain that gets worse when you lie down or awakens you at night.  Your pain is worse than you have experienced in the past.  Your pain is lasting longer than 4 weeks.  You are suddenly losing weight without reason. MAKE SURE YOU:  Understand these instructions.  Will watch your condition.  Will get help right away if you are not doing well or get worse. Document Released: 06/21/2001 Document Revised: 12/27/2011 Document Reviewed: 11/06/2011 [facility] Patient Information 2015 [employer] [place]. This information is not intended to replace advice given to you by your health care provider. Make sure you discuss any questions you have with your health care provider.  Chronic Pain Discharge Instructions  Emergency care providers appreciate that many patients coming to us are in severe pain and we wish to address their pain in the safest, most responsible manner.  It is important to recognize however, that the proper treatment of chronic pain differs from that of the pain of injuries and acute illnesses.  Our goal is to provide quality, safe, personalized care and we thank you for giving us the opportunity to serve you. The use of narcotics and related agents for chronic pain syndromes may lead to additional physical and psychological problems.  Nearly as many people die from prescription narcotics each year as die from car crashes.  Additionally, this risk is increased if such prescriptions are obtained from a variety of sources.  Therefore, only your primary care physician or a pain management specialist is able to safely treat such syndromes with narcotic medications long-term.    Documentation revealing such prescriptions have been sought from multiple sources may  prohibit us from providing a refill or different narcotic medication.  Your name may be checked first through the [facility] [place] [place] Controlled Substances Reporting System.  This database is a record of controlled substance medication prescriptions that the patient has received.  This has been established by [facility] in an effort to eliminate the dangerous, and often life threatening, practice of obtaining multiple prescriptions from different medical providers.   If you have a chronic pain syndrome (i.e. chronic headaches, recurrent back or neck pain, dental pain, abdominal or pelvis pain without a specific diagnosis, or neuropathic pain such as fibromyalgia) or recurrent visits for the same condition without an acute diagnosis, you may be treated with non-narcotics and other non-addictive medicines.  Allergic reactions or negative side effects that may be reported by a patient to such medications will not typically lead to the use of a narcotic analgesic or other controlled substance as an alternative.   Patients managing chronic pain with a personal physician should have provisions in place for breakthrough pain.  If you are in crisis, you should call your physician.  If your physician directs you to the emergency department, please have the doctor call and speak to our attending physician concerning your  care.   When patients come to the Emergency Department (ED) with acute medical conditions in which the Emergency Department physician feels appropriate to prescribe narcotic or sedating pain medication, the physician will prescribe these in very limited quantities.  The amount of these medications will last only until you can see your primary care physician in his/her office.  Any patient who returns to the ED seeking refills should expect only non-narcotic pain medications.   In the event of an acute medical condition exists and the emergency physician feels it is necessary that the patient be given a  narcotic or sedating medication -  a responsible adult driver should be present in the room prior to the medication being given by the nurse.   Prescriptions for narcotic or sedating medications that have been lost, stolen or expired will not be refilled in the Emergency Department.    Patients who have chronic pain may receive non-narcotic prescriptions until seen by their primary care physician.  It is every patients personal responsibility to maintain active prescriptions with his or her primary care physician or specialist.

## 2014-01-11 NOTE — ED Provider Notes (Signed)
Medical screening examination/treatment/procedure(s) were performed by non-physician practitioner and as supervising physician I was immediately available for consultation/collaboration.   EKG Interpretation None        Kanoa Phillippi J. Jarelly Rinck, MD 01/11/14 1525 

## 2014-01-20 ENCOUNTER — Ambulatory Visit: Payer: No Typology Code available for payment source | Admitting: Internal Medicine

## 2014-01-23 ENCOUNTER — Encounter: Payer: Self-pay | Admitting: Internal Medicine

## 2014-01-23 ENCOUNTER — Ambulatory Visit: Payer: No Typology Code available for payment source | Attending: Internal Medicine | Admitting: Internal Medicine

## 2014-01-23 VITALS — BP 139/96 | HR 76 | Temp 98.7°F | Resp 99 | Wt 190.0 lb

## 2014-01-23 DIAGNOSIS — F17200 Nicotine dependence, unspecified, uncomplicated: Secondary | ICD-10-CM

## 2014-01-23 DIAGNOSIS — IMO0002 Reserved for concepts with insufficient information to code with codable children: Secondary | ICD-10-CM

## 2014-01-23 DIAGNOSIS — R05 Cough: Secondary | ICD-10-CM

## 2014-01-23 DIAGNOSIS — R059 Cough, unspecified: Secondary | ICD-10-CM

## 2014-01-23 DIAGNOSIS — M5417 Radiculopathy, lumbosacral region: Secondary | ICD-10-CM

## 2014-01-23 DIAGNOSIS — J449 Chronic obstructive pulmonary disease, unspecified: Secondary | ICD-10-CM

## 2014-01-23 DIAGNOSIS — IMO0001 Reserved for inherently not codable concepts without codable children: Secondary | ICD-10-CM

## 2014-01-23 MED ORDER — DOXYCYCLINE HYCLATE 100 MG PO TABS: 100.0000 mg | ORAL_TABLET | Freq: Two times a day (BID) | ORAL | Status: DC

## 2014-01-23 MED ORDER — BENZONATATE 100 MG PO CAPS: 100.0000 mg | ORAL_CAPSULE | Freq: Three times a day (TID) | ORAL | Status: DC | PRN

## 2014-01-23 NOTE — Progress Notes (Signed)
MRN: [id number] Name: Karen Baldwin  Sex: female Age: 43 y.o. DOB: Sep 03, 1970  Allergies: Azithromycin; Levaquin; Bactrim; and Penicillins  Chief Complaint  Patient presents with  . Cough    HPI: Patient is 43 y.o. female who has to of COPD complaining of productive cough for the last 4-5 days the sputum is yellowish in color patient still smokes cigarettes, has been using her inhalers including Dulera and albuterol when necessary, complaining of chills but denies any fever, patient had some wheezing in the morning and used albuterol, patient also is chronic lower back pain and was seen by a neurosurgeon ordered an MRI which reported disc bulging, patient has financial constraints and was ready to see her neurosurgeon back  Past Medical History  Diagnosis Date  . Chronic bronchitis     "get it sometimes twice/yr" (07/25/2013)  . Pelvic fracture     non-surgical mgt.   . Vaginal delivery 1999  . Depression   . COPD (chronic obstructive pulmonary disease)   . Pneumonia 1979; 2004    "double";   Marland Kitchen. CAP (community acquired pneumonia) 07/25/2013  . Shortness of breath     "comes on at anytime" (07/25/2013)  . GERD (gastroesophageal reflux disease)   . Hepatitis C 03/2012    [facility]    . Migraine     "once or twice/month" (07/25/2013)  . Chronic lower back pain   . Anxiety   . Kidney stone     "passed on my own"    Past Surgical History  Procedure Laterality Date  . [place]  . Tubal ligation  1999  . Vaginal hysterectomy  2001    "partial", for menorrhagia associated with uterine fibroids.   Marland Kitchen. Hysteroscopy  1999      Medication List       This list is accurate as of: 01/23/14  4:37 PM.  Always use your most recent med list.               albuterol 108 (90 BASE) MCG/ACT inhaler  Commonly known as:  PROVENTIL HFA;VENTOLIN HFA  Inhale 2 puffs into the lungs every 6 (six) hours as needed for wheezing or shortness  of breath.     amitriptyline 50 MG tablet  Commonly known as:  ELAVIL  Take 50 mg by mouth at bedtime.     benzonatate 100 MG capsule  Commonly known as:  TESSALON  Take 1 capsule (100 mg total) by mouth 3 (three) times daily as needed for cough.     diclofenac 50 MG EC tablet  Commonly known as:  VOLTAREN  Take 1 tablet (50 mg total) by mouth 2 (two) times daily.     dicyclomine 20 MG tablet  Commonly known as:  BENTYL  Take 20 mg by mouth 2 (two) times daily.     doxycycline 100 MG tablet  Commonly known as:  VIBRA-TABS  Take 1 tablet (100 mg total) by mouth 2 (two) times daily.     gabapentin 300 MG capsule  Commonly known as:  NEURONTIN  Take 300 mg by mouth at bedtime.     ibuprofen 800 MG tablet  Commonly known as:  ADVIL,MOTRIN  Take 800 mg by mouth every 8 (eight) hours as needed for moderate pain.     ipratropium 17 MCG/ACT inhaler  Commonly known as:  ATROVENT HFA  Inhale 2 puffs into the lungs every 4 (four) hours as needed  for wheezing.     lidocaine 5 %  Commonly known as:  LIDODERM  Place 1 patch onto the skin daily. Remove & Discard patch within 12 hours or as directed by MD     methocarbamol 500 MG tablet  Commonly known as:  ROBAXIN  Take 1 tablet (500 mg total) by mouth 2 (two) times daily.     mometasone-formoterol 100-5 MCG/ACT Aero  Commonly known as:  DULERA  Inhale 2 puffs into the lungs 2 (two) times daily.     predniSONE 20 MG tablet  Commonly known as:  DELTASONE  Take 2 tablets (40 mg total) by mouth daily.     tiZANidine 4 MG tablet  Commonly known as:  ZANAFLEX  Take 4 mg by mouth 3 (three) times daily.        Meds ordered this encounter  Medications  . doxycycline (VIBRA-TABS) 100 MG tablet    Sig: Take 1 tablet (100 mg total) by mouth 2 (two) times daily.    Dispense:  20 tablet    Refill:  0  . benzonatate (TESSALON) 100 MG capsule    Sig: Take 1 capsule (100 mg total) by mouth 3 (three) times daily as needed for cough.      Dispense:  30 capsule    Refill:  1    There is no immunization history for the selected administration types on file for this patient.  Family History  Problem Relation Age of Onset  . Aneurysm Father   . Cancer Other   . Diabetes Other   . Thyroid disease Other   . Irritable bowel syndrome Other   . Aneurysm Other     History  Substance Use Topics  . Smoking status: Current Every Day Smoker -- 0.50 packs/day for 15 years    Types: Cigarettes  . Smokeless tobacco: Never Used  . Alcohol Use: No    Review of Systems   As noted in HPI  Filed Vitals:   01/23/14 1600  BP: 139/96  Pulse: 76  Temp: 98.7 F (37.1 C)  Resp: 99    Physical Exam  Physical Exam  Constitutional: No distress.  Eyes: EOM are normal. Pupils are equal, round, and reactive to light.  Cardiovascular: Normal rate and regular rhythm.   Pulmonary/Chest: Breath sounds normal. No respiratory distress. She has no rales.  Minimal wheezing  Musculoskeletal: She exhibits no edema.  Lower lumbar paraspinal tenderness     CBC    Component Value Date/Time   WBC 6.3 01/01/2014 1151   RBC 4.10 01/01/2014 1151   HGB 13.9 01/01/2014 1233   HCT 41.0 01/01/2014 1233   PLT 307 01/01/2014 1151   MCV 92.9 01/01/2014 1151   LYMPHSABS 1.8 10/06/2013 1332   MONOABS 0.4 10/06/2013 1332   EOSABS 0.2 10/06/2013 1332   BASOSABS 0.0 10/06/2013 1332    CMP     Component Value Date/Time   NA 140 01/01/2014 1233   K 3.6* 01/01/2014 1233   CL 104 01/01/2014 1233   CO2 26 10/09/2013 1250   GLUCOSE 91 01/01/2014 1233   BUN 7 01/01/2014 1233   CREATININE 0.70 01/01/2014 1233   CREATININE 0.85 08/12/2013 1117   CALCIUM 9.7 10/09/2013 1250   PROT 7.7 10/09/2013 1250   ALBUMIN 4.5 10/09/2013 1250   AST 51* 10/09/2013 1250   ALT 84* 10/09/2013 1250   ALKPHOS 86 10/09/2013 1250   BILITOT 0.3 10/09/2013 1250   GFRNONAA >90 10/09/2013 1250   GFRNONAA 85 08/12/2013  1117   GFRAA >90 10/09/2013 1250   GFRAA >89 08/12/2013 1117    No results  found for this basename: chol, tri, ldl    No components found with this basename: hga1c    Lab Results  Component Value Date/Time   AST 51* 10/09/2013 12:50 PM    Assessment and Plan  COPD with bronchitis - Plan: doxycycline (VIBRA-TABS) 100 MG tablet, continue with Dulera, albuterol when necessary  Smoking Advised patient to quit smoking.  Radiculopathy of lumbosacral region Patient will be with her financial counselor to help her assist, I have advised patient to have followup with her neurosurgeon for further recommendation.  Cough - Plan: benzonatate (TESSALON) 100 MG capsule    Stepfon Rawles, Ayesha Rumpf, MD

## 2014-01-23 NOTE — Progress Notes (Signed)
Patient complains of cough with yellow/green mucous Worse when she lies down States has bulging discs and would like to be referred to pain management

## 2014-01-27 ENCOUNTER — Ambulatory Visit: Payer: No Typology Code available for payment source | Admitting: Gastroenterology

## 2014-01-30 ENCOUNTER — Ambulatory Visit: Payer: No Typology Code available for payment source | Admitting: Family Medicine

## 2014-02-04 ENCOUNTER — Ambulatory Visit: Payer: No Typology Code available for payment source | Admitting: Family Medicine

## 2014-02-28 ENCOUNTER — Emergency Department (HOSPITAL_COMMUNITY)
Admission: EM | Admit: 2014-02-28 | Discharge: 2014-02-28 | Disposition: A | Discharge status: Home / Self Care | Payer: No Typology Code available for payment source | Attending: Emergency Medicine | Admitting: Emergency Medicine

## 2014-02-28 ENCOUNTER — Encounter (HOSPITAL_COMMUNITY): Payer: Self-pay | Admitting: Emergency Medicine

## 2014-02-28 DIAGNOSIS — Z9851 Tubal ligation status: Secondary | ICD-10-CM | POA: Diagnosis not present

## 2014-02-28 DIAGNOSIS — Z8701 Personal history of pneumonia (recurrent): Secondary | ICD-10-CM | POA: Insufficient documentation

## 2014-02-28 DIAGNOSIS — G8929 Other chronic pain: Secondary | ICD-10-CM | POA: Diagnosis not present

## 2014-02-28 DIAGNOSIS — A499 Bacterial infection, unspecified: Secondary | ICD-10-CM | POA: Diagnosis not present

## 2014-02-28 DIAGNOSIS — Z8719 Personal history of other diseases of the digestive system: Secondary | ICD-10-CM | POA: Diagnosis not present

## 2014-02-28 DIAGNOSIS — Z8781 Personal history of (healed) traumatic fracture: Secondary | ICD-10-CM | POA: Insufficient documentation

## 2014-02-28 DIAGNOSIS — Z792 Long term (current) use of antibiotics: Secondary | ICD-10-CM | POA: Insufficient documentation

## 2014-02-28 DIAGNOSIS — J449 Chronic obstructive pulmonary disease, unspecified: Secondary | ICD-10-CM | POA: Diagnosis not present

## 2014-02-28 DIAGNOSIS — Z87442 Personal history of urinary calculi: Secondary | ICD-10-CM | POA: Insufficient documentation

## 2014-02-28 DIAGNOSIS — N760 Acute vaginitis: Secondary | ICD-10-CM | POA: Insufficient documentation

## 2014-02-28 DIAGNOSIS — Z79899 Other long term (current) drug therapy: Secondary | ICD-10-CM | POA: Insufficient documentation

## 2014-02-28 DIAGNOSIS — F17200 Nicotine dependence, unspecified, uncomplicated: Secondary | ICD-10-CM | POA: Insufficient documentation

## 2014-02-28 DIAGNOSIS — R109 Unspecified abdominal pain: Secondary | ICD-10-CM | POA: Diagnosis present

## 2014-02-28 DIAGNOSIS — Z8659 Personal history of other mental and behavioral disorders: Secondary | ICD-10-CM | POA: Diagnosis not present

## 2014-02-28 DIAGNOSIS — B9689 Other specified bacterial agents as the cause of diseases classified elsewhere: Secondary | ICD-10-CM | POA: Diagnosis not present

## 2014-02-28 DIAGNOSIS — G43909 Migraine, unspecified, not intractable, without status migrainosus: Secondary | ICD-10-CM | POA: Insufficient documentation

## 2014-02-28 DIAGNOSIS — Z90710 Acquired absence of both cervix and uterus: Secondary | ICD-10-CM | POA: Insufficient documentation

## 2014-02-28 DIAGNOSIS — R319 Hematuria, unspecified: Secondary | ICD-10-CM | POA: Insufficient documentation

## 2014-02-28 DIAGNOSIS — Z9889 Other specified postprocedural states: Secondary | ICD-10-CM | POA: Diagnosis not present

## 2014-02-28 DIAGNOSIS — J4489 Other specified chronic obstructive pulmonary disease: Secondary | ICD-10-CM | POA: Insufficient documentation

## 2014-02-28 HISTORY — DX: Reserved for concepts with insufficient information to code with codable children: IMO0002

## 2014-02-28 LAB — WET PREP, GENITAL
TRICH WET PREP: NONE SEEN
WBC, Wet Prep HPF POC: NONE SEEN
YEAST WET PREP: NONE SEEN

## 2014-02-28 LAB — URINALYSIS, ROUTINE W REFLEX MICROSCOPIC
BILIRUBIN URINE: NEGATIVE
GLUCOSE, UA: NEGATIVE mg/dL
Ketones, ur: NEGATIVE mg/dL
LEUKOCYTES UA: NEGATIVE
Nitrite: NEGATIVE
PH: 7 (ref 5.0–8.0)
Protein, ur: NEGATIVE mg/dL
Specific Gravity, Urine: 1.004 — ABNORMAL LOW (ref 1.005–1.030)
Urobilinogen, UA: 0.2 mg/dL (ref 0.0–1.0)

## 2014-02-28 LAB — CBC WITH DIFFERENTIAL/PLATELET
BASOS PCT: 1 % (ref 0–1)
Basophils Absolute: 0 10*3/uL (ref 0.0–0.1)
Eosinophils Absolute: 0.2 10*3/uL (ref 0.0–0.7)
Eosinophils Relative: 4 % (ref 0–5)
HEMATOCRIT: 34.5 % — AB (ref 36.0–46.0)
HEMOGLOBIN: 12 g/dL (ref 12.0–15.0)
LYMPHS ABS: 1.3 10*3/uL (ref 0.7–4.0)
Lymphocytes Relative: 32 % (ref 12–46)
MCH: 31.6 pg (ref 26.0–34.0)
MCHC: 34.8 g/dL (ref 30.0–36.0)
MCV: 90.8 fL (ref 78.0–100.0)
MONOS PCT: 8 % (ref 3–12)
Monocytes Absolute: 0.3 10*3/uL (ref 0.1–1.0)
NEUTROS ABS: 2.3 10*3/uL (ref 1.7–7.7)
Neutrophils Relative %: 55 % (ref 43–77)
Platelets: 276 10*3/uL (ref 150–400)
RBC: 3.8 MIL/uL — ABNORMAL LOW (ref 3.87–5.11)
RDW: 12.4 % (ref 11.5–15.5)
WBC: 4.1 10*3/uL (ref 4.0–10.5)

## 2014-02-28 LAB — COMPREHENSIVE METABOLIC PANEL
ALK PHOS: 68 U/L (ref 39–117)
ALT: 65 U/L — ABNORMAL HIGH (ref 0–35)
AST: 65 U/L — ABNORMAL HIGH (ref 0–37)
Albumin: 3.8 g/dL (ref 3.5–5.2)
Anion gap: 12 (ref 5–15)
BUN: 7 mg/dL (ref 6–23)
CHLORIDE: 105 meq/L (ref 96–112)
CO2: 20 mEq/L (ref 19–32)
CREATININE: 0.64 mg/dL (ref 0.50–1.10)
Calcium: 9.1 mg/dL (ref 8.4–10.5)
GFR calc non Af Amer: >90 mL/min (ref >90)
GLUCOSE: 93 mg/dL (ref 70–99)
Potassium: 4.5 mEq/L (ref 3.7–5.3)
Sodium: 137 mEq/L (ref 137–147)
Total Bilirubin: 0.3 mg/dL (ref 0.3–1.2)
Total Protein: 7.4 g/dL (ref 6.0–8.3)

## 2014-02-28 LAB — RPR

## 2014-02-28 LAB — HIV ANTIBODY (ROUTINE TESTING W REFLEX): HIV 1&2 Ab, 4th Generation: NONREACTIVE

## 2014-02-28 LAB — URINE MICROSCOPIC-ADD ON

## 2014-02-28 MED ORDER — METRONIDAZOLE 500 MG PO TABS: 500.0000 mg | ORAL_TABLET | Freq: Two times a day (BID) | ORAL | Status: DC

## 2014-02-28 MED ORDER — ONDANSETRON HCL 4 MG/2ML IJ SOLN
4.0000 mg | Freq: Once | INTRAMUSCULAR | Status: AC
  Administered 2014-02-28: 4 mg via INTRAVENOUS
  Filled 2014-02-28: qty 2

## 2014-02-28 MED ORDER — FENTANYL CITRATE 0.05 MG/ML IJ SOLN
50.0000 ug | Freq: Once | INTRAMUSCULAR | Status: AC
  Administered 2014-02-28: 50 ug via INTRAMUSCULAR
  Filled 2014-02-28: qty 2

## 2014-02-28 MED ORDER — HYDROMORPHONE HCL PF 1 MG/ML IJ SOLN
1.0000 mg | Freq: Once | INTRAMUSCULAR | Status: AC
  Administered 2014-02-28: 1 mg via INTRAVENOUS
  Filled 2014-02-28: qty 1

## 2014-02-28 MED ORDER — HYDROCODONE-ACETAMINOPHEN 5-325 MG PO TABS: 2.0000 | ORAL_TABLET | ORAL | Status: DC | PRN

## 2014-02-28 NOTE — ED Notes (Addendum)
Per pt, pain since Monday to lower abdomen.  Has been vomiting last 2 days.  Pt also having to "force" urine out.  Pt now states last 2 days having vaginal discharge.  Pt concerned for STD d/t significant other "cheating on her".  Pt was on antibiotics in last 3 weeks and thought at first she had yeast infection.

## 2014-02-28 NOTE — Discharge Instructions (Signed)
Take Metronidazole for treatment of bacterial vaginosis Pain medicine- Vicodin 2 tabs by mouth every 4 hrs as need for pain. Follow up with your PCP for further management and evaluation of your abdominal pain.   Emergency Department Resource Guide 1) Find a Doctor and Pay Out of Pocket Although you won't have to find out who is covered by your insurance plan, it is a good idea to ask around and get recommendations. You will then need to call the office and see if the doctor you have chosen will accept you as a new patient and what types of options they offer for patients who are self-pay. Some doctors offer discounts or will set up payment plans for their patients who do not have insurance, but you will need to ask so you aren't surprised when you get to your appointment.  2) Contact Your Local Health Department Not all health departments have doctors that can see patients for sick visits, but many do, so it is worth a call to see if yours does. If you don't know where your local health department is, you can check in your phone book. The CDC also has a tool to help you locate your state's health department, and many state websites also have listings of all of their local health departments.  3) Find a Walk-in Clinic If your illness is not likely to be very severe or complicated, you may want to try a walk in clinic. These are popping up all over the country in pharmacies, drugstores, and shopping centers. They're usually staffed by nurse practitioners or physician assistants that have been trained to treat common illnesses and complaints. They're usually fairly quick and inexpensive. However, if you have serious medical issues or chronic medical problems, these are probably not your best option.  No Primary Care Doctor: - Call Health Connect at  [phone] - they can help you locate a primary care doctor that  accepts your insurance, provides certain services, etc. - Physician Referral Service-  [phone]  Chronic Pain Problems: Organization         Address  Phone   Notes  [facility]  [phone] Patients need to be referred by their primary care doctor.   Medication Assistance: Organization         Address  Phone   Notes  [facility] [address] [place], [place] [address] [phone] --Must be a resident of [place] -- Must have NO insurance coverage whatsoever (no Medicaid/ Medicare, etc.) -- The pt. MUST have a primary care doctor that directs their care regularly and follows them in the community   [facility]  [phone]   [employer]  [phone]    Agencies that provide inexpensive medical care: Organization         Address  Phone   Notes  [facility]  [phone]   [facility]    [phone]   [facility] [address] [place], [place] [address] [phone]   [facility] [address], [place] [phone]   [facility]    [phone](336) [phone]   [facility]    [phone](336) [phone]   [facility]  [address], [place] Phone:  [phone], Fax:  [phone]) [phone] Hours of Operation:  9 am - 6 pm, M-F.  Also accepts Medicaid/Medicare and self-pay.  [facility]  [address], [place] Phone: [phone], Fax: [phone]. Hours of Operation:  8:30 am - 5:30 pm, M-F.  Also accepts Medicaid and self-pay.  [facility] [place] [address], [place] Phone: [phone]   [place], [place], [place] [phone], Ext. [phone] Mondays & Thursdays: 7-9 AM.  First 15 patients are seen on a first come, first serve basis.    [place] Providers:  Organization         Address  Phone   Notes  [facility] [address],  [place] [phone] Also accepts self-pay patients.  [facility] [address], [place]  [phone]   [place], [address], [place] [phone]   [facility] [address] [place] [phone]   Lucianne Lei [address], [place]   [phone] Only accepts [place] Access [place] patients after they have their name applied to their card.   Self-Pay (no insurance) in [facility]:  Organization         Address  Phone   Notes  Sickle Cell Patients, [facility] Internal Medicine [place] [phone]   [facility] [place] [phone]   [facility] [place]  [place], [place], [place] [phone]   [facility]/Dr. Osei-Bonsu  [address], [place] or [address], [place] [phone] Phone number for both [place] and [place] locations is the same.  [facility] [address], [place] [phone]   [facility] [address] [place] or [address] [phone]   [facility] [address], [place] [phone], phone; [phone], fax Sees patients 1st and 3rd Saturday of every month.  Must not qualify for public or private insurance (i.e. Medicaid, Medicare, [place] Health Choice, Veterans' Benefits)  Household income should be no more than 200% of the poverty level The clinic cannot treat you if you are pregnant or think you are pregnant  Sexually transmitted diseases are not treated at the clinic.    Dental Care: Organization         Address  Phone  Notes  [facility] Department of [facility] [phone] Accepts children up to age 29 who are enrolled in  [place] or [place]; pregnant women with a Medicaid card; and children who have applied for Medicaid or [place] Health Choice, but were declined, whose parents can pay a reduced fee at time of service.  [facility] of [facility]  [address], [place] [phone] Accepts children up to age 36 who are enrolled in [place] or [place]; pregnant women with a Medicaid card; and children who have applied for Medicaid or [place] Health Choice, but were declined, whose parents can pay a reduced fee at time of service.  [facility]  [place] [phone] Patients are seen by appointment only. Walk-ins are not accepted. [place] will see patients 66 years of age and older. Monday - Tuesday (8am-5pm) Most Wednesdays (8:30-5pm) $30 per visit, cash only  [place] [facility]  [address], [facility] [phone] Patients are seen by appointment only. Walk-ins are not accepted. [place] will see patients 55 years of age and older. One Wednesday Evening (Monthly: Volunteer Based).  $30 per visit, cash only  [place]  [phone] for adults; Children under age 16, call Graduate Pediatric Dentistry at [phone]. Children aged 15-14, please call [phone] to request a pediatric application.  Dental services are provided in all areas of dental care including fillings, crowns and bridges, complete and partial dentures, implants, gum treatment, root canals, and extractions. Preventive care is also provided. Treatment is provided to both adults and children. Patients are selected via a lottery and there is often a waiting list.   [facility] [address] [place]  [phone] [email or web address]   [facility] [address] [place], [place] [phone], Ext. [phone] Second and Fourth Thursday of each month, opens at 6:30  AM; Clinic ends at 9 AM.  Patients are seen on a first-come first-served basis, and a limited number are seen during each clinic.   [facility]  [address] Hillard Danker [place], [place] [phone]   Eligibility Requirements You must have lived in [place], [place], or [place] counties for at least the last three months.   You cannot be eligible for state or federal sponsored [employer], including [employer], [place], or [employer].   You generally cannot be eligible for healthcare insurance through your employer.    How to apply: Eligibility screenings are held every Tuesday and Wednesday afternoon from 1:00 pm until 4:00 pm. You do not need an appointment for the interview!  [facility] [address] [place], [place]   [employer]  [place] Department  [place]  [phone]    Behavioral Health Resources in the Community: Intensive Outpatient Programs Organization         Address  Phone  Notes  [place]. [address], [place], [place] [phone]   [facility] [address], [place], [employer]   [facility]: [facility] [address], [place], [place]   [place] [address]  [place], [place] or [phone]   Substance Abuse Resources Organization         Address  Phone  Notes  [facility] Drug Services  [phone]   [place]  [phone]   [facility] [place]   Chinita Pester  [phone]   Residential & Outpatient Substance Abuse Program  [phone]   Psychological Services Organization         Address  Phone  Notes  [facility] [place]  [place]  [phone]   [place] [address] [place] [phone] or  [phone]    Mobile Crisis Teams Organization         Address  Phone  Notes  [facility], Mobile Crisis Care Unit  [phone]   [facility]  [address] [place], [place]   [place] [address] [place] [phone]    Self-Help/Support Groups Organization         Address  Phone             Notes  [facility] [place] - variety of support groups  [phone]- [phone] Call for more information  Narcotics Anonymous (NA), [facility] [address], [employer] [facility]  2 meetings at this location   Statistician         Address  Phone  Notes  [facility] [address],    [place]  [phone]   [facility]  [address], [place] [address], [place], [place] [phone]   [facility] [address] [place], [place] [phone] Admissions: 8am-3pm M-F  [facility] [address]    [place], [place] [phone]   [facility] [address] [place], [place], [place] [phone]   [facility] [address],  [place], [place] [phone]   [facility] - Intensive Outpatient [address], [place], [place] [phone]   [facility] ([facility] [address] [place].,  [place], [place] [phone] or [phone]   [facility] ([facility]) [address] [place], [place] [phone] Accepts Medicaid  [facility] [address]  [place] [phone] Substance Abuse/Addiction Treatment   [facility] Organization         Address  Phone  Notes  [facility]  [phone]   Angie Fava, PhD [address] Ervin Knack [place], [place]   [phone] or [phone]   [facility]   [address] [place], [place] [phone]   [facility] [address]  [place], [place] [phone] Insurance/Medicaid/sponsorship through [facility] and Families [address]                                    [place], [place] [phone] Therapy/tele-psych/case  [facility] [address], [place] [phone]    Dr. Lolly Mustache  [phone]   [facility] [place]  [employer] [facility] Dept. 1) [address] [place] 2) [address], [place] 3)  [address], [place] [phone]  [phone]   [facility] Child Abuse Hotline (913)840-1376 or (623) [phone] (After Hours)       Abdominal Pain, Women Abdominal (stomach, pelvic, or belly) pain can be caused by many things. It is important to tell your doctor:  The location of the pain.  Does it come and go or is it present all the time?  Are there things that start the pain (eating certain foods, exercise)?  Are there other symptoms associated with the pain (fever, nausea, vomiting, diarrhea)? All of this is helpful to know when trying to find the cause of the pain. CAUSES   Stomach: virus or bacteria infection, or ulcer.  Intestine: appendicitis (inflamed appendix), regional ileitis (Crohn's disease), ulcerative colitis (inflamed colon), irritable bowel syndrome, diverticulitis (inflamed diverticulum of the colon), or cancer of the stomach or intestine.  Gallbladder disease or stones in the gallbladder.  Kidney disease, kidney stones, or infection.  Pancreas infection or cancer.  Fibromyalgia (pain disorder).  Diseases of the female organs:  Uterus: fibroid (non-cancerous) tumors or infection.  Fallopian tubes: infection or tubal pregnancy.  Ovary: cysts or tumors.  Pelvic adhesions (scar tissue).  Endometriosis (uterus lining tissue growing in the pelvis and on the pelvic organs).  Pelvic congestion syndrome (female organs  filling up with blood just before the menstrual period).  Pain  with the menstrual period.  Pain with ovulation (producing an egg).  Pain with an IUD (intrauterine device, birth control) in the uterus.  Cancer of the female organs.  Functional pain (pain not caused by a disease, may improve without treatment).  Psychological pain.  Depression. DIAGNOSIS  Your doctor will decide the seriousness of your pain by doing an examination.  Blood tests.  X-rays.  Ultrasound.  CT scan (computed tomography, special type of X-ray).  MRI (magnetic resonance imaging).  Cultures, for infection.  Barium enema (dye inserted in the large intestine, to better view it with X-rays).  Colonoscopy (looking in intestine with a lighted tube).  Laparoscopy (minor surgery, looking in abdomen with a lighted tube).  Major abdominal exploratory surgery (looking in abdomen with a large incision). TREATMENT  The treatment will depend on the cause of the pain.   Many cases can be observed and treated at home.  Over-the-counter medicines recommended by your caregiver.  Prescription medicine.  Antibiotics, for infection.  Birth control pills, for painful periods or for ovulation pain.  Hormone treatment, for endometriosis.  Nerve blocking injections.  Physical therapy.  Antidepressants.  Counseling with a psychologist or psychiatrist.  Minor or major surgery. HOME CARE INSTRUCTIONS   Do not take laxatives, unless directed by your caregiver.  Take over-the-counter pain medicine only if ordered by your caregiver. Do not take aspirin because it can cause an upset stomach or bleeding.  Try a clear liquid diet (broth or water) as ordered by your caregiver. Slowly move to a bland diet, as tolerated, if the pain is related to the stomach or intestine.  Have a thermometer and take your temperature several times a day, and record it.  Bed rest and sleep, if it helps the pain.  Avoid sexual intercourse, if it causes pain.  Avoid stressful  situations.  Keep your follow-up appointments and tests, as your caregiver orders.  If the pain does not go away with medicine or surgery, you may try:  Acupuncture.  Relaxation exercises (yoga, meditation).  Group therapy.  Counseling. SEEK MEDICAL CARE IF:   You notice certain foods cause stomach pain.  Your home care treatment is not helping your pain.  You need stronger pain medicine.  You want your IUD removed.  You feel faint or lightheaded.  You develop nausea and vomiting.  You develop a rash.  You are having side effects or an allergy to your medicine. SEEK IMMEDIATE MEDICAL CARE IF:   Your pain does not go away or gets worse.  You have a fever.  Your pain is felt only in portions of the abdomen. The right side could possibly be appendicitis. The left lower portion of the abdomen could be colitis or diverticulitis.  You are passing blood in your stools (bright red or black tarry stools, with or without vomiting).  You have blood in your urine.  You develop chills, with or without a fever.  You pass out. MAKE SURE YOU:   Understand these instructions.  Will watch your condition.  Will get help right away if you are not doing well or get worse. Document Released: 04/24/2007 Document Revised: 11/11/2013 Document Reviewed: 05/14/2009 [facility] Patient Information 2015 [place], [place]. This information is not intended to replace advice given to you by your health care provider. Make sure you discuss any questions you have with your health care provider.

## 2014-02-28 NOTE — ED Notes (Signed)
Karen ArbourKaren J attempted blood draw unsuccessful at this time

## 2014-02-28 NOTE — ED Notes (Signed)
Phelbotomy at bedside at present time.

## 2014-02-28 NOTE — ED Provider Notes (Signed)
CSN: [id number]     Arrival date & time 02/28/14  0941 History   First MD Initiated Contact with Patient 02/28/14 [phone]     Chief Complaint  Patient presents with  . Abdominal Pain  . Vaginal Discharge     (Consider location/radiation/quality/duration/timing/severity/associated sxs/prior Treatment) HPI Karen Baldwin is a 43 y.o. female G2 P2, with a history of cholecystectomy, hysterectomy (still has ovaries), IBS with constipation and diarrhea, nephrolithiasis, chronic low back pain, comes in for evaluation of abdominal pain and vaginal discharge. She states the abdominal pain started about a week ago it is just a crampy sensation in her lower abdomen different from her normal cramps due to IBS. She reports a vaginal discharge as a thick milk-like substance with a foul odor that has been present for the past 2 days. She thinks her partner cheated on her and now she believes she has STI.  She reports an inability to start the floor for urine because like she has to push to get started. She reports hematuria but has had this since the birth of her son in 69. She has also had nausea and vomiting over the past 2 days. She has only tried 800 mg ibuprofen has not experienced any relief. She does report that heat makes the abdominal pain felt better. She denies any new constipation or diarrhea, no blood in her stools. Denies fevers, chills, other abdominal pains, chest pain, shortness of breath, numbness or tingling.  Past Medical History  Diagnosis Date  . Chronic bronchitis     "get it sometimes twice/yr" (07/25/2013)  . Pelvic fracture     non-surgical mgt.   . Vaginal delivery 1999  . Depression   . COPD (chronic obstructive pulmonary disease)   . Pneumonia 1979; 2004    "double";   Marland Kitchen CAP (community acquired pneumonia) 07/25/2013  . Shortness of breath     "comes on at anytime" (07/25/2013)  . GERD (gastroesophageal reflux disease)   . Hepatitis C 03/2012    diagnosed by [facility].     . Migraine     "once or twice/month" (07/25/2013)  . Chronic lower back pain   . Anxiety   . Kidney stone     "passed on my own"  . Bulging disc    Past Surgical History  Procedure Laterality Date  . Cholecystectomy  2009    in [place]  . Tubal ligation  1999  . Vaginal hysterectomy  2001    "partial", for menorrhagia associated with uterine fibroids.   Marland Kitchen Hysteroscopy  1999   Family History  Problem Relation Age of Onset  . Aneurysm Father   . Cancer Other   . Diabetes Other   . Thyroid disease Other   . Irritable bowel syndrome Other   . Aneurysm Other    History  Substance Use Topics  . Smoking status: Current Every Day Smoker -- 0.50 packs/day for 15 years    Types: Cigarettes  . Smokeless tobacco: Never Used  . Alcohol Use: No   OB History   Grav Para Term Preterm Abortions TAB SAB Ect Mult Living                 Review of Systems  Constitutional: Negative for fever.  HENT: Negative for sore throat.   Eyes: Negative for visual disturbance.  Respiratory: Negative for shortness of breath.   Cardiovascular: Negative for chest pain.  Gastrointestinal: Positive for abdominal pain.  Endocrine: Negative for polyuria.  Genitourinary:  Positive for hematuria and vaginal discharge. Negative for vaginal bleeding.       Hesitancy   Skin: Negative for rash.  Neurological: Negative for headaches.      Allergies  Azithromycin; Levaquin; Bactrim; and Penicillins  Home Medications   Prior to Admission medications   Medication Sig Start Date End Date Taking? Authorizing Provider  albuterol (PROVENTIL HFA;VENTOLIN HFA) 108 (90 BASE) MCG/ACT inhaler Inhale 2 puffs into the lungs every 6 (six) hours as needed for wheezing or shortness of breath.   Yes Historical Provider, MD  amitriptyline (ELAVIL) 50 MG tablet Take 50 mg by mouth at bedtime.   Yes Historical Provider, MD  dicyclomine (BENTYL) 20 MG tablet Take 20 mg by mouth 2 (two) times daily.    Yes Historical  Provider, MD  gabapentin (NEURONTIN) 300 MG capsule Take 300 mg by mouth at bedtime.   Yes Historical Provider, MD  ibuprofen (ADVIL,MOTRIN) 800 MG tablet Take 800 mg by mouth every 8 (eight) hours as needed for moderate pain.    Yes Historical Provider, MD  ipratropium (ATROVENT HFA) 17 MCG/ACT inhaler Inhale 2 puffs into the lungs every 4 (four) hours as needed for wheezing.   Yes Historical Provider, MD  mometasone-formoterol (DULERA) 100-5 MCG/ACT AERO Inhale 2 puffs into the lungs 2 (two) times daily.   Yes Historical Provider, MD  tiZANidine (ZANAFLEX) 4 MG tablet Take 4 mg by mouth 3 (three) times daily.   Yes Historical Provider, MD  HYDROcodone-acetaminophen (NORCO/VICODIN) 5-325 MG per tablet Take 2 tablets by mouth every 4 (four) hours as needed. 02/28/14   Earle Gell Ladashia Demarinis, PA-C  metroNIDAZOLE (FLAGYL) 500 MG tablet Take 1 tablet (500 mg total) by mouth 2 (two) times daily. 02/28/14   Earle Gell Kaliyan Osbourn, PA-C   BP 106/68  Pulse 70  Temp(Src) 98.5 F (36.9 C) (Oral)  Resp 16  SpO2 99% Physical Exam  Nursing note and vitals reviewed. Constitutional: She is oriented to person, place, and time. She appears well-developed and well-nourished.  HENT:  Head: Normocephalic and atraumatic.  Mouth/Throat: Oropharynx is clear and moist.  Eyes: Conjunctivae are normal. Pupils are equal, round, and reactive to light. Right eye exhibits no discharge. Left eye exhibits no discharge. No scleral icterus.  Neck: Neck supple.  Cardiovascular: Normal rate, regular rhythm and normal heart sounds.   Pulmonary/Chest: Effort normal and breath sounds normal. No respiratory distress.  Abdominal: Soft. There is no tenderness.  Moderate ttp in LLQ RLQ and suprapubic areas. No rashes or lesions appreciated. No rebound or guarding. No masses. No CVA tenderness.  Genitourinary:  Chaperone was present for the entire genital exam. No lesions or rashes appreciated on vulva. Cultures obtained for STI  panel No blood in vaginal vault. Discharge present was copious white thick Upon bi manual exam- Exquisite tenderness to palpation in vaginal vault. No fullness or masses appreciated. No abnormalities appreciated in structural anatomy. No cervix-hysterectomy   Musculoskeletal: She exhibits no tenderness.  Neurological: She is alert and oriented to person, place, and time.  Cranial Nerves II-XII grossly intact  Skin: Skin is warm and dry. No rash noted.  Psychiatric: She has a normal mood and affect.    ED Course  Procedures (including critical care time) Labs Review Labs Reviewed  WET PREP, GENITAL - Abnormal; Notable for the following:    Clue Cells Wet Prep HPF POC MANY (*)    All other components within normal limits  URINALYSIS, ROUTINE W REFLEX MICROSCOPIC - Abnormal; Notable for the following:  Specific Gravity, Urine 1.004 (*)    Hgb urine dipstick SMALL (*)    All other components within normal limits  CBC WITH DIFFERENTIAL - Abnormal; Notable for the following:    RBC 3.80 (*)    HCT 34.5 (*)    All other components within normal limits  COMPREHENSIVE METABOLIC PANEL - Abnormal; Notable for the following:    AST 65 (*)    ALT 65 (*)    All other components within normal limits  GC/CHLAMYDIA PROBE AMP  URINE MICROSCOPIC-ADD ON  RPR  HIV ANTIBODY (ROUTINE TESTING)    Imaging Review No results found.   EKG Interpretation None     Meds given in ED:  Medications  ondansetron (ZOFRAN) injection 4 mg (4 mg Intravenous Given 02/28/14 1101)  fentaNYL (SUBLIMAZE) injection 50 mcg (50 mcg Intramuscular Given 02/28/14 1201)  ondansetron (ZOFRAN) injection 4 mg (4 mg Intravenous Given 02/28/14 1310)  HYDROmorphone (DILAUDID) injection 1 mg (1 mg Intravenous Given 02/28/14 1339)    New Prescriptions   HYDROCODONE-ACETAMINOPHEN (NORCO/VICODIN) 5-325 MG PER TABLET    Take 2 tablets by mouth every 4 (four) hours as needed.   METRONIDAZOLE (FLAGYL) 500 MG TABLET    Take 1  tablet (500 mg total) by mouth 2 (two) times daily.     MDM  Pt resting comfortablt, NAD Vitals stable WNL-afebrile. Labwork- essentially normal. PE, pelvic yielded diffuse tenderness with no other abnormality Wet prep- many clue cells, will trxt with Metro. Last abd/pelvis CT 4/1- negative for any acute findings or pathology with similar complaint. Seen here multiple times for abd pain,chronic pain- no leukocytosis, fever or other evidence to suggest an emergent pathology DC with pain med, metro and f/u with PCP. Final diagnoses:  Abdominal pain in female patient  Bacterial vaginosis  Prior to patient discharge, I discussed and reviewed this case with Dr.Pickering         Sharlene MottsBenjamin W Liandra Mendia, PA-C 02/28/14 1359

## 2014-02-28 NOTE — ED Notes (Signed)
Pt reports lower abdominal pain with difficulty voiding for a week. Pt reports nausea, vomiting, and white thick vaginal discharge with odor for 2 days.

## 2014-03-01 LAB — GC/CHLAMYDIA PROBE AMP
CT Probe RNA: NEGATIVE
GC Probe RNA: NEGATIVE

## 2014-03-01 NOTE — ED Provider Notes (Signed)
Medical screening examination/treatment/procedure(s) were conducted as a shared visit with non-physician practitioner(s) and myself.  I personally evaluated the patient during the encounter. Patient with abdominal pain. Reassuring labs. Bacterial vaginosis. Has had previous CTs which were usually negative for acute cause. Will discharge home   EKG Interpretation None       Juliet RudeNathan R. Rubin PayorPickering, MD 03/01/14 [id number]

## 2014-04-05 ENCOUNTER — Encounter (HOSPITAL_COMMUNITY): Payer: Self-pay | Admitting: Emergency Medicine

## 2014-04-05 ENCOUNTER — Emergency Department (HOSPITAL_COMMUNITY)
Admission: EM | Admit: 2014-04-05 | Discharge: 2014-04-05 | Disposition: A | Discharge status: Home / Self Care | Payer: No Typology Code available for payment source | Attending: Emergency Medicine | Admitting: Emergency Medicine

## 2014-04-05 ENCOUNTER — Emergency Department (HOSPITAL_COMMUNITY): Payer: No Typology Code available for payment source

## 2014-04-05 DIAGNOSIS — J4489 Other specified chronic obstructive pulmonary disease: Secondary | ICD-10-CM | POA: Insufficient documentation

## 2014-04-05 DIAGNOSIS — F3289 Other specified depressive episodes: Secondary | ICD-10-CM | POA: Insufficient documentation

## 2014-04-05 DIAGNOSIS — J449 Chronic obstructive pulmonary disease, unspecified: Secondary | ICD-10-CM | POA: Diagnosis not present

## 2014-04-05 DIAGNOSIS — F17200 Nicotine dependence, unspecified, uncomplicated: Secondary | ICD-10-CM | POA: Insufficient documentation

## 2014-04-05 DIAGNOSIS — Z8739 Personal history of other diseases of the musculoskeletal system and connective tissue: Secondary | ICD-10-CM | POA: Diagnosis not present

## 2014-04-05 DIAGNOSIS — F329 Major depressive disorder, single episode, unspecified: Secondary | ICD-10-CM | POA: Diagnosis not present

## 2014-04-05 DIAGNOSIS — Z8619 Personal history of other infectious and parasitic diseases: Secondary | ICD-10-CM | POA: Insufficient documentation

## 2014-04-05 DIAGNOSIS — F411 Generalized anxiety disorder: Secondary | ICD-10-CM | POA: Diagnosis not present

## 2014-04-05 DIAGNOSIS — G8929 Other chronic pain: Secondary | ICD-10-CM | POA: Diagnosis not present

## 2014-04-05 DIAGNOSIS — Z79899 Other long term (current) drug therapy: Secondary | ICD-10-CM | POA: Insufficient documentation

## 2014-04-05 DIAGNOSIS — Z87442 Personal history of urinary calculi: Secondary | ICD-10-CM | POA: Diagnosis not present

## 2014-04-05 DIAGNOSIS — Z8781 Personal history of (healed) traumatic fracture: Secondary | ICD-10-CM | POA: Diagnosis not present

## 2014-04-05 DIAGNOSIS — Z8719 Personal history of other diseases of the digestive system: Secondary | ICD-10-CM | POA: Insufficient documentation

## 2014-04-05 DIAGNOSIS — Z880 Allergy status to penicillin: Secondary | ICD-10-CM | POA: Diagnosis not present

## 2014-04-05 DIAGNOSIS — R0789 Other chest pain: Secondary | ICD-10-CM

## 2014-04-05 DIAGNOSIS — Z8701 Personal history of pneumonia (recurrent): Secondary | ICD-10-CM | POA: Insufficient documentation

## 2014-04-05 DIAGNOSIS — R079 Chest pain, unspecified: Secondary | ICD-10-CM | POA: Diagnosis present

## 2014-04-05 DIAGNOSIS — R071 Chest pain on breathing: Secondary | ICD-10-CM | POA: Diagnosis not present

## 2014-04-05 DIAGNOSIS — G43909 Migraine, unspecified, not intractable, without status migrainosus: Secondary | ICD-10-CM | POA: Insufficient documentation

## 2014-04-05 MED ORDER — KETOROLAC TROMETHAMINE 60 MG/2ML IM SOLN
60.0000 mg | Freq: Once | INTRAMUSCULAR | Status: AC
  Administered 2014-04-05: 60 mg via INTRAMUSCULAR
  Filled 2014-04-05: qty 2

## 2014-04-05 NOTE — ED Notes (Signed)
Pt arrived to the ED with a complaint of left sided rib pain.  Pt's roommate was walking on her back when she slipped and fell on her injuring her left side.  Pt states the pain is located underneath her left breast.  Pt states it is also effecting her breathing and causing pain.

## 2014-04-05 NOTE — ED Provider Notes (Signed)
CSN: [id number]     Arrival date & time 04/05/14  2007 History   First MD Initiated Contact with Patient 04/05/14 2030     Chief Complaint  Patient presents with  . Chest Pain     (Consider location/radiation/quality/duration/timing/severity/associated sxs/prior Treatment) HPI 43 y.o. Female complaining of left rib pain after friend was walking on her back yesterday.  She states friend slipped and she now has pain in the left anterior rib cage worsening since.  She took ibuprofen without relief.  She has some dyspnea due to pain.  She denies any other injury.  STates she used the ibuprofen and heat without relief.   Past Medical History  Diagnosis Date  . Chronic bronchitis     "get it sometimes twice/yr" (07/25/2013)  . Pelvic fracture     non-surgical mgt.   . Vaginal delivery 1999  . Depression   . COPD (chronic obstructive pulmonary disease)   . Pneumonia 1979; 2004    "double";   Marland Kitchen CAP (community acquired pneumonia) 07/25/2013  . Shortness of breath     "comes on at anytime" (07/25/2013)  . GERD (gastroesophageal reflux disease)   . Hepatitis C 03/2012    diagnosed by [facility] done [facility].    . Migraine     "once or twice/month" (07/25/2013)  . Chronic lower back pain   . Anxiety   . Kidney stone     "passed on my own"  . Bulging disc    Past Surgical History  Procedure Laterality Date  . Cholecystectomy  2009    in [place]  . Tubal ligation  1999  . Vaginal hysterectomy  2001    "partial", for menorrhagia associated with uterine fibroids.   Marland Kitchen Hysteroscopy  1999   Family History  Problem Relation Age of Onset  . Aneurysm Father   . Cancer Other   . Diabetes Other   . Thyroid disease Other   . Irritable bowel syndrome Other   . Aneurysm Other    History  Substance Use Topics  . Smoking status: Current Every Day Smoker -- 0.50 packs/day for 15 years    Types: Cigarettes  . Smokeless tobacco: Never Used  . Alcohol Use: No   OB History   Grav Para Term  Preterm Abortions TAB SAB Ect Mult Living                 Review of Systems  All other systems reviewed and are negative.     Allergies  Azithromycin; Levaquin; Bactrim; and Penicillins  Home Medications   Prior to Admission medications   Medication Sig Start Date End Date Taking? Authorizing Provider  albuterol (PROVENTIL HFA;VENTOLIN HFA) 108 (90 BASE) MCG/ACT inhaler Inhale 2 puffs into the lungs every 6 (six) hours as needed for wheezing or shortness of breath.   Yes Historical Provider, MD  amitriptyline (ELAVIL) 50 MG tablet Take 50 mg by mouth at bedtime.   Yes Historical Provider, MD  dicyclomine (BENTYL) 20 MG tablet Take 20 mg by mouth 2 (two) times daily.    Yes Historical Provider, MD  gabapentin (NEURONTIN) 300 MG capsule Take 300 mg by mouth at bedtime.   Yes Historical Provider, MD  ibuprofen (ADVIL,MOTRIN) 800 MG tablet Take 800 mg by mouth every 8 (eight) hours as needed for moderate pain.    Yes Historical Provider, MD  tiZANidine (ZANAFLEX) 4 MG tablet Take 4 mg by mouth 3 (three) times daily.   Yes Historical Provider, MD  BP 135/91  Pulse 98  Temp(Src) 98.2 F (36.8 C) (Oral)  Resp 22  SpO2 97% Physical Exam  Nursing note and vitals reviewed. Constitutional: She is oriented to person, place, and time. She appears well-developed and well-nourished.  HENT:  Head: Normocephalic and atraumatic.  Right Ear: External ear normal.  Left Ear: External ear normal.  Nose: Nose normal.  Mouth/Throat: Oropharynx is clear and moist.  Eyes: Conjunctivae and EOM are normal. Pupils are equal, round, and reactive to light.  Neck: Normal range of motion. Neck supple.  Cardiovascular: Normal rate, regular rhythm, normal heart sounds and intact distal pulses.   Pulmonary/Chest: Effort normal and breath sounds normal.   She exhibits tenderness.    Abdominal: Soft. Bowel sounds are normal.  Musculoskeletal: Normal range of motion.  Neurological: She is alert and  oriented to person, place, and time. She has normal reflexes.  Skin: Skin is warm and dry.  Psychiatric: She has a normal mood and affect. Her behavior is normal. Judgment and thought content normal.    ED Course  Procedures (including critical care time) Labs Review Labs Reviewed - No data to display  Imaging Review Dg Ribs Unilateral W/chest Left  04/05/2014   CLINICAL DATA:  Left rib pain.  EXAM: LEFT RIBS AND CHEST - 3+ VIEW  COMPARISON:  July 23, 2013.  FINDINGS: No fracture or other bone lesions are seen involving the ribs. There is no evidence of pneumothorax or pleural effusion. Both lungs are clear. Heart size and mediastinal contours are within normal limits.  IMPRESSION: Normal left ribs.  No acute cardiopulmonary abnormality seen.   Electronically Signed   By: Roque Lias M.D.   On: 04/05/2014 21:30     EKG Interpretation None      MDM   Final diagnoses:  Chest wall pain   43 y.o. Female complaining of chest wall pain after injury yesterday.  No signs of trauma on exam and patient with diffuse ttp of chest wall.  Patient with chronic back pain and multiple ed assessments for pain complaints.  [place] controlled substance data base queried and reveals rx for 90 oxycodone 10 mg tablets dispensed on 03/27/14 (9 days ago). Patient advised to continue nsaids and follow up with pain md or pmd.    Hilario Quarry, MD 04/06/14 2328

## 2014-04-05 NOTE — Discharge Instructions (Signed)

## 2014-04-25 ENCOUNTER — Other Ambulatory Visit: Payer: Self-pay

## 2014-04-28 ENCOUNTER — Ambulatory Visit: Payer: No Typology Code available for payment source | Admitting: Internal Medicine

## 2014-05-26 ENCOUNTER — Ambulatory Visit: Payer: No Typology Code available for payment source | Admitting: Internal Medicine

## 2014-11-30 ENCOUNTER — Emergency Department (HOSPITAL_COMMUNITY)
Admission: EM | Admit: 2014-11-30 | Discharge: 2014-11-30 | Disposition: A | Discharge status: Home / Self Care | Payer: No Typology Code available for payment source | Attending: Emergency Medicine | Admitting: Emergency Medicine

## 2014-11-30 ENCOUNTER — Encounter (HOSPITAL_COMMUNITY): Payer: Self-pay | Admitting: Emergency Medicine

## 2014-11-30 ENCOUNTER — Emergency Department (HOSPITAL_COMMUNITY): Payer: No Typology Code available for payment source

## 2014-11-30 DIAGNOSIS — Y9289 Other specified places as the place of occurrence of the external cause: Secondary | ICD-10-CM | POA: Diagnosis not present

## 2014-11-30 DIAGNOSIS — Z79899 Other long term (current) drug therapy: Secondary | ICD-10-CM | POA: Insufficient documentation

## 2014-11-30 DIAGNOSIS — W1839XA Other fall on same level, initial encounter: Secondary | ICD-10-CM | POA: Insufficient documentation

## 2014-11-30 DIAGNOSIS — Y998 Other external cause status: Secondary | ICD-10-CM | POA: Insufficient documentation

## 2014-11-30 DIAGNOSIS — Z8701 Personal history of pneumonia (recurrent): Secondary | ICD-10-CM | POA: Diagnosis not present

## 2014-11-30 DIAGNOSIS — F419 Anxiety disorder, unspecified: Secondary | ICD-10-CM | POA: Diagnosis not present

## 2014-11-30 DIAGNOSIS — G43909 Migraine, unspecified, not intractable, without status migrainosus: Secondary | ICD-10-CM | POA: Insufficient documentation

## 2014-11-30 DIAGNOSIS — Y9389 Activity, other specified: Secondary | ICD-10-CM | POA: Insufficient documentation

## 2014-11-30 DIAGNOSIS — J449 Chronic obstructive pulmonary disease, unspecified: Secondary | ICD-10-CM | POA: Diagnosis not present

## 2014-11-30 DIAGNOSIS — S6991XA Unspecified injury of right wrist, hand and finger(s), initial encounter: Secondary | ICD-10-CM | POA: Diagnosis present

## 2014-11-30 DIAGNOSIS — Z87442 Personal history of urinary calculi: Secondary | ICD-10-CM | POA: Insufficient documentation

## 2014-11-30 DIAGNOSIS — G8929 Other chronic pain: Secondary | ICD-10-CM | POA: Diagnosis not present

## 2014-11-30 DIAGNOSIS — S62632A Displaced fracture of distal phalanx of right middle finger, initial encounter for closed fracture: Secondary | ICD-10-CM | POA: Diagnosis not present

## 2014-11-30 DIAGNOSIS — Z8719 Personal history of other diseases of the digestive system: Secondary | ICD-10-CM | POA: Insufficient documentation

## 2014-11-30 DIAGNOSIS — T1490XA Injury, unspecified, initial encounter: Secondary | ICD-10-CM

## 2014-11-30 DIAGNOSIS — Z8619 Personal history of other infectious and parasitic diseases: Secondary | ICD-10-CM | POA: Diagnosis not present

## 2014-11-30 DIAGNOSIS — F329 Major depressive disorder, single episode, unspecified: Secondary | ICD-10-CM | POA: Insufficient documentation

## 2014-11-30 DIAGNOSIS — S62609A Fracture of unspecified phalanx of unspecified finger, initial encounter for closed fracture: Secondary | ICD-10-CM

## 2014-11-30 DIAGNOSIS — Z880 Allergy status to penicillin: Secondary | ICD-10-CM | POA: Insufficient documentation

## 2014-11-30 DIAGNOSIS — Z87891 Personal history of nicotine dependence: Secondary | ICD-10-CM | POA: Insufficient documentation

## 2014-11-30 MED ORDER — KETOROLAC TROMETHAMINE 60 MG/2ML IM SOLN
60.0000 mg | Freq: Once | INTRAMUSCULAR | Status: AC
  Administered 2014-11-30: 60 mg via INTRAMUSCULAR
  Filled 2014-11-30: qty 2

## 2014-11-30 NOTE — ED Notes (Signed)
Patient reports she fell on right hand.  Reports injury to right middle finger.

## 2014-11-30 NOTE — Discharge Instructions (Signed)

## 2014-11-30 NOTE — ED Notes (Signed)
Ortho tech called for splint.

## 2014-11-30 NOTE — ED Notes (Signed)
Patient reports she took one 10mg  oxycodone at 6am and one about 30 minutes PTA with no relief.

## 2014-11-30 NOTE — ED Provider Notes (Signed)
CSN: [id number]     Arrival date & time 11/30/14  1239 History  This chart was scribed for non-physician practitioner Langston Masker, PA-C, working with Toy Cookey, MD, by Andrew Au, ED Scribe. This patient was seen in room WTR8/WTR8 and the patient's care was started at 1:26 PM.   Chief Complaint  Patient presents with  . Finger Injury   The history is provided by the patient. No language interpreter was used.   Karen Baldwin is a 44 y.o. female who presents to the Emergency Department complaining of right middle finger injury. Pt states she fell and tried to catch herself with her right hand, injuring right middle finger.. Pt has taken 2 doses of 10 mg of oxycodone today, last dose being about 1 hour ago, which she usually takes for a bulging disc, without relief to pain. Pt has hx of Hep C and denies taking ibuproden and tylenol. Pt is allergic to penicillin, azithromycin, bactrim and Levaquin.   Past Medical History  Diagnosis Date  . Chronic bronchitis     "get it sometimes twice/yr" (07/25/2013)  . Pelvic fracture     non-surgical mgt.   . Vaginal delivery 1999  . Depression   . COPD (chronic obstructive pulmonary disease)   . Pneumonia 1979; 2004    "double";   Marland Kitchen CAP (community acquired pneumonia) 07/25/2013  . Shortness of breath     "comes on at anytime" (07/25/2013)  . GERD (gastroesophageal reflux disease)   . Hepatitis C 03/2012    diagnosed by [facility].    . Migraine     "once or twice/month" (07/25/2013)  . Chronic lower back pain   . Anxiety   . Kidney stone     "passed on my own"  . Bulging disc    Past Surgical History  Procedure Laterality Date  . Cholecystectomy  2009    in [place]  . Tubal ligation  1999  . Vaginal hysterectomy  2001    "partial", for menorrhagia associated with uterine fibroids.   Marland Kitchen Hysteroscopy  1999   Family History  Problem Relation Age of Onset  . Aneurysm Father   . Cancer Other   . Diabetes Other   . Thyroid  disease Other   . Irritable bowel syndrome Other   . Aneurysm Other    History  Substance Use Topics  . Smoking status: Former Smoker -- 0.50 packs/day for 15 years    Types: Cigarettes  . Smokeless tobacco: Never Used  . Alcohol Use: No   OB History    No data available     Review of Systems  All other systems reviewed and are negative.  Allergies  Azithromycin; Levaquin; Bactrim; and Penicillins  Home Medications   Prior to Admission medications   Medication Sig Start Date End Date Taking? Authorizing Provider  ALPRAZolam Prudy Feeler) 1 MG tablet Take 1 mg by mouth at bedtime as needed for anxiety.   Yes Historical Provider, MD  oxyCODONE (OXY IR/ROXICODONE) 5 MG immediate release tablet Take 10 mg by mouth every 4 (four) hours as needed for severe pain.   Yes Historical Provider, MD  albuterol (PROVENTIL HFA;VENTOLIN HFA) 108 (90 BASE) MCG/ACT inhaler Inhale 2 puffs into the lungs every 6 (six) hours as needed for wheezing or shortness of breath.    Historical Provider, MD  amitriptyline (ELAVIL) 50 MG tablet Take 50 mg by mouth at bedtime.    Historical Provider, MD  dicyclomine (BENTYL) 20 MG tablet Take  20 mg by mouth 2 (two) times daily.     Historical Provider, MD  gabapentin (NEURONTIN) 300 MG capsule Take 300 mg by mouth at bedtime.    Historical Provider, MD  ibuprofen (ADVIL,MOTRIN) 800 MG tablet Take 800 mg by mouth every 8 (eight) hours as needed for moderate pain.     Historical Provider, MD  tiZANidine (ZANAFLEX) 4 MG tablet Take 4 mg by mouth 3 (three) times daily.    Historical Provider, MD   BP 140/95 mmHg  Pulse 105  Temp(Src) 99 F (37.2 C) (Oral)  Resp 18  Ht 5' 8.5" (1.74 m)  Wt 160 lb (72.576 kg)  BMI 23.97 kg/m2  SpO2 100% Physical Exam  Constitutional: She is oriented to person, place, and time. She appears well-developed and well-nourished. No distress.  HENT:  Head: Normocephalic and atraumatic.  Eyes: Conjunctivae and EOM are normal.  Neck: Neck  supple.  Cardiovascular: Normal rate.   Pulmonary/Chest: Effort normal.  Musculoskeletal: Normal range of motion.  Right 3rd digit- Swollen PIP, DIP and distal tip. Decreased ROM.   Neurological: She is alert and oriented to person, place, and time.  Skin: Skin is warm and dry.  Psychiatric: She has a normal mood and affect. Her behavior is normal.  Nursing note and vitals reviewed.   ED Course  Procedures (including critical care time) DIAGNOSTIC STUDIES: Oxygen Saturation is 100% on RA, normal by my interpretation.    COORDINATION OF CARE: 1:38 PM- Pt advised of plan for treatment and pt agrees.  Labs Review Labs Reviewed - No data to display  Imaging Review Dg Finger Middle Right  11/30/2014   CLINICAL DATA:  Fall, injury to right middle finger. Pain and swelling.  EXAM: RIGHT MIDDLE FINGER 2+V  COMPARISON:  None.  FINDINGS: There is a fracture through the dorsal base of the right middle finger distal phalanx. This is nondisplaced. No additional acute bony abnormality. Slight joint space narrowing in the DIP joint.  Small metallic foreign body noted within the soft tissues of the distal right middle finger. This may be remote.  IMPRESSION: Fracture through the dorsal base of the right middle finger distal phalanx.  Small radiopaque/ metallic foreign body in the distal soft tissues, question chronicity.   Electronically Signed   By: Charlett NoseKevin  Dover M.D.   On: 11/30/2014 14:31     EKG Interpretation None      MDM   Final diagnoses:  Injury  Fracture, finger, closed, initial encounter    Finger splint Schedule to see Hand surgeon  Torodol IM    I personally performed the services in this documentation, which was scribed in my presence.  The recorded information has been reviewed and considered.   Barnet PallKaren SofiaPAC. [name], PA-C 11/30/14 [address]  Leslie K Bow MarSofia, PA-C 11/30/14 1502  Toy CookeyMegan Docherty, MD 11/30/14 [phone]

## 2015-02-15 IMAGING — CR DG CHEST 2V
2 series · 2 of 2 positions shown · non-contrast
Comparison: 12/24/2007

CLINICAL DATA: Cough, congestion

EXAM:
CHEST  2 VIEW

[w chest pa]
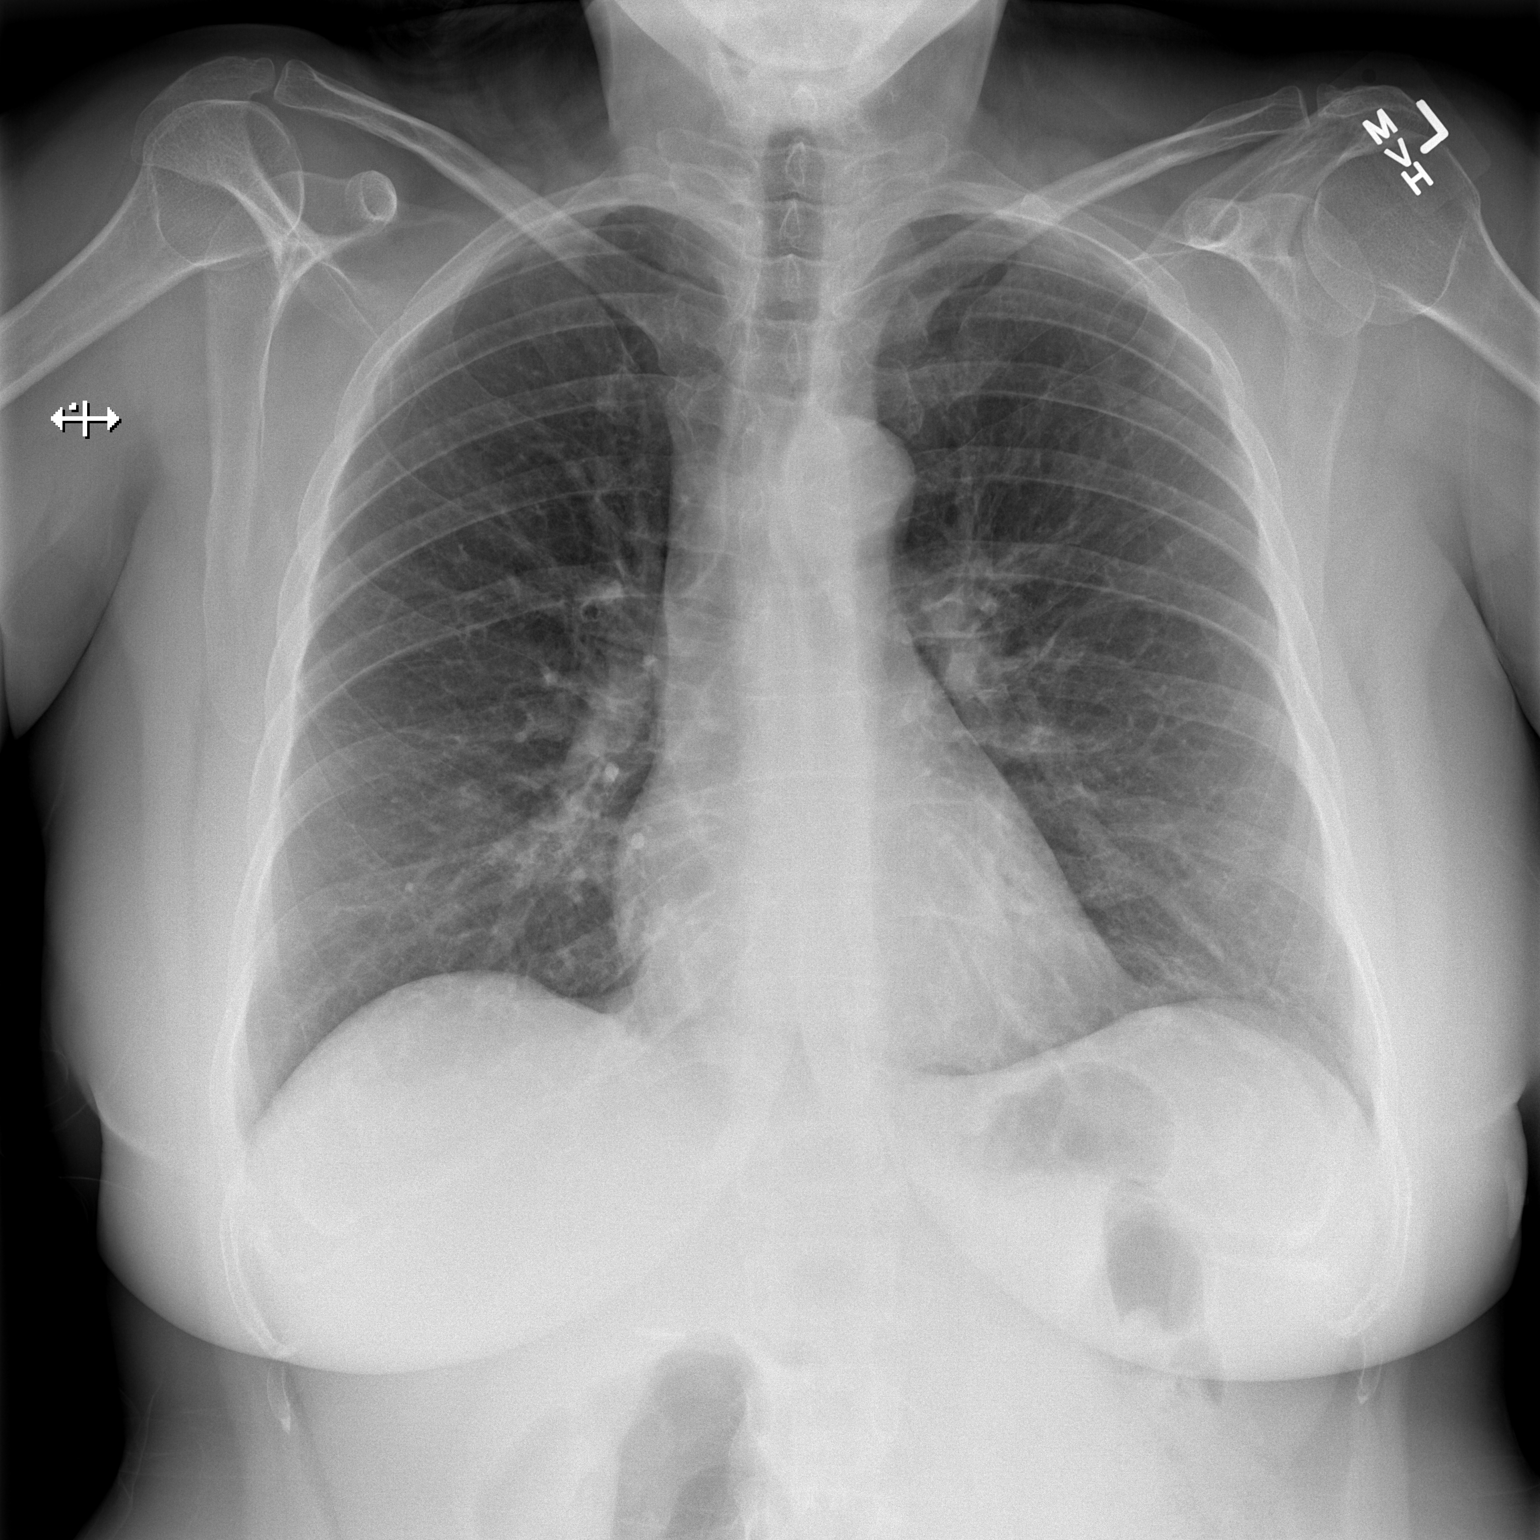

[w chest lat]
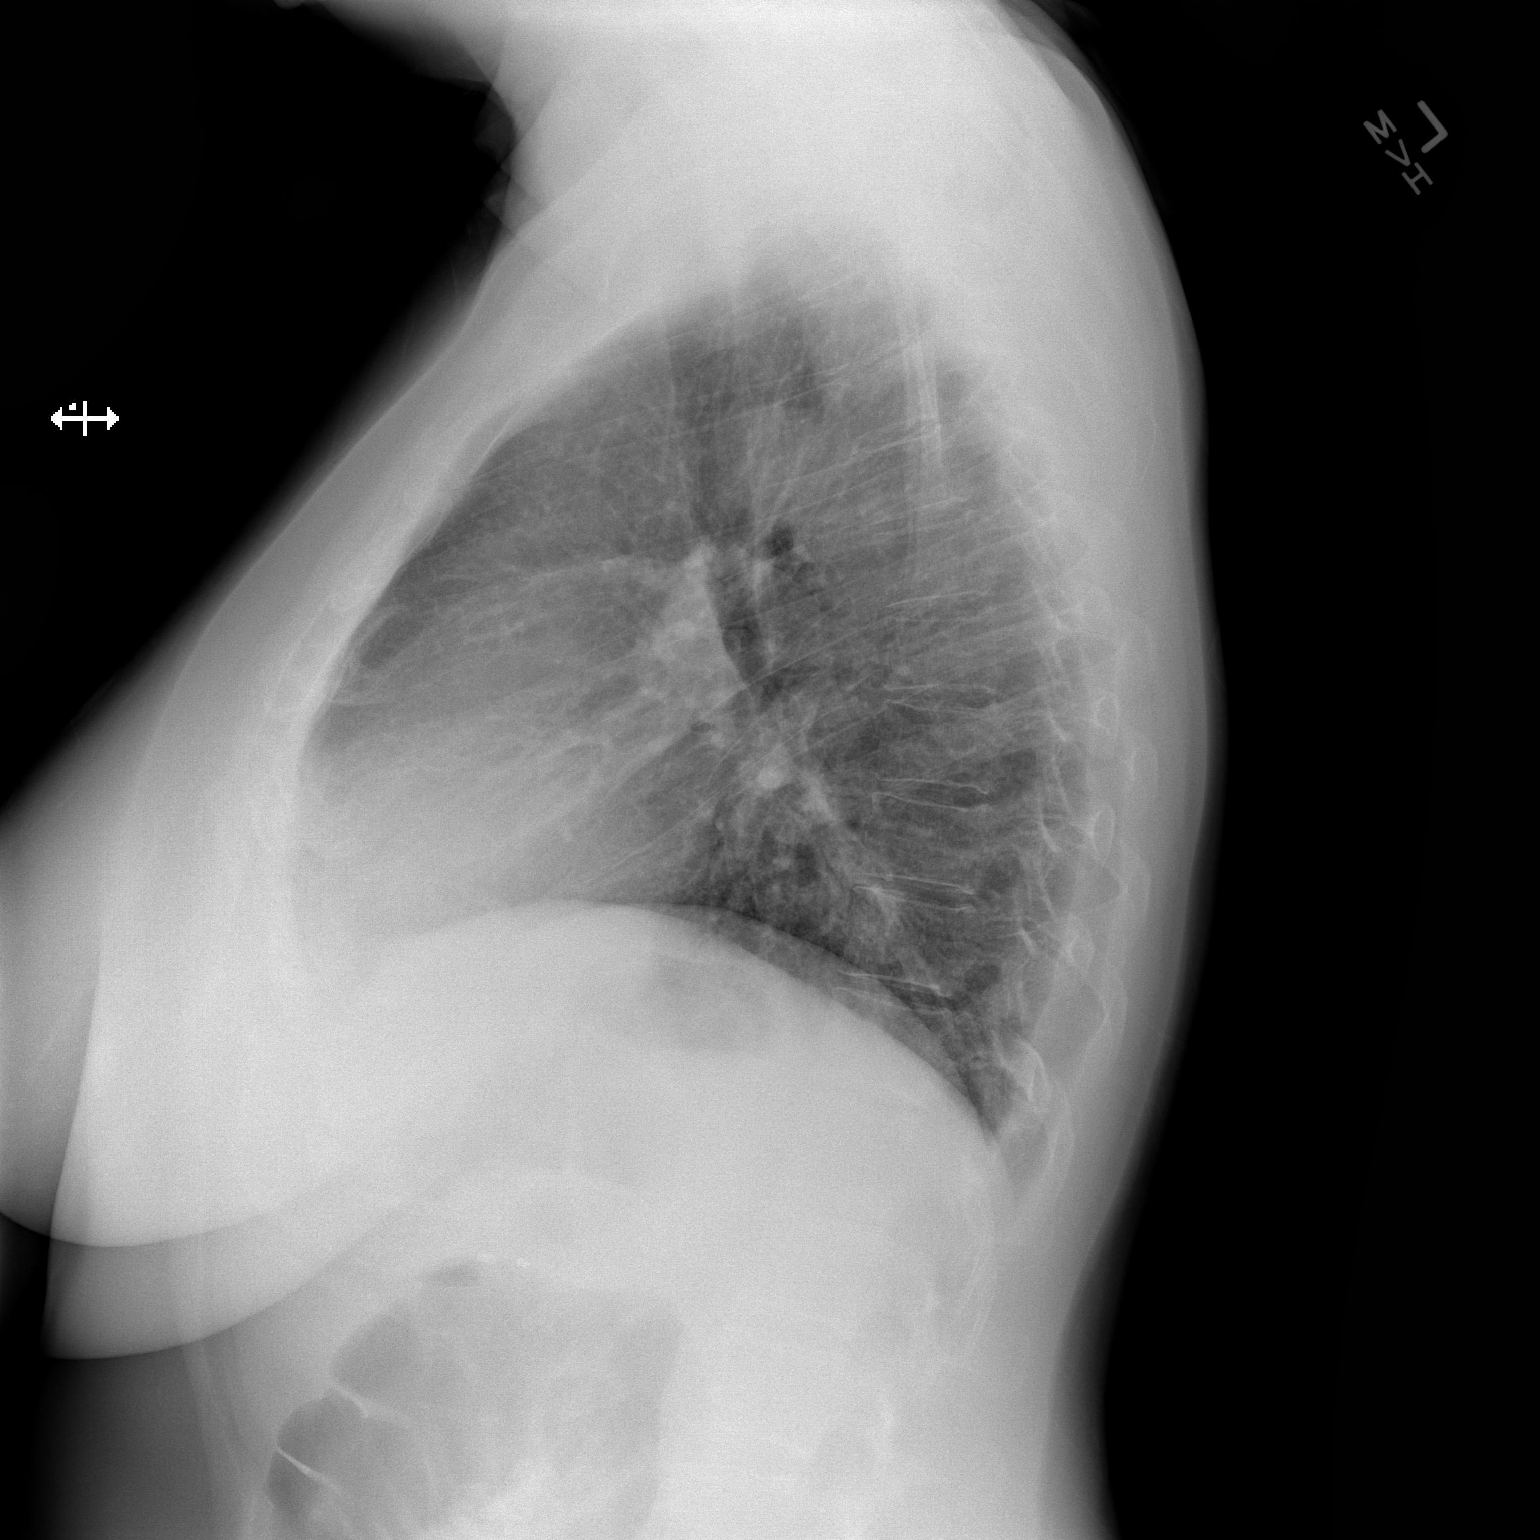

[2 of 2 positions shown; findings below may reference images not displayed]

FINDINGS: Cardiomediastinal silhouette is stable. No acute infiltrate or
pleural effusion. No pulmonary edema. Bony thorax is unremarkable.
IMPRESSION: No active cardiopulmonary disease.

## 2015-02-17 IMAGING — CT CT ABD-PELV W/ CM
2 of 4 series · 16 of 46 positions shown, 18 images · IV contrast (CONTRAST)
Comparison: CT abdomen and pelvis without contrast 09/03/2008. CT
abdomen and pelvis with contrast 11/13/2007.

CLINICAL DATA: Persistent right-sided abdominal pain.

EXAM:
CT ABDOMEN AND PELVIS WITH CONTRAST
TECHNIQUE: Multidetector CT imaging of the abdomen and pelvis was performed
using the standard protocol following bolus administration of
intravenous contrast.
CONTRAST:  80mL OMNIPAQUE IOHEXOL 300 MG/ML  SOLN

[Series 2: routine · axial · 0.75mm/px · z∈[-458,+7]mm · 13 of 103 slices shown, 15 images]
[im 5/103  soft-tissue]
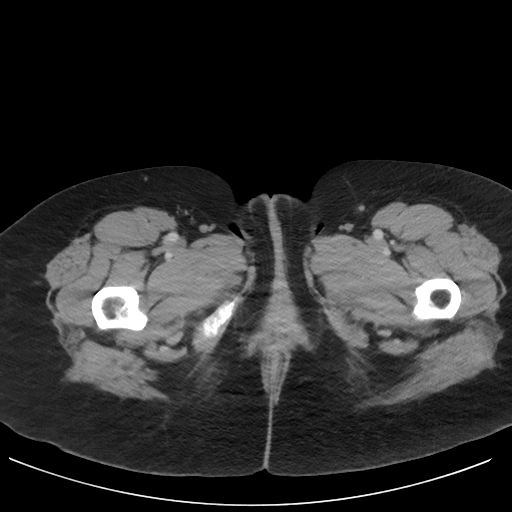
[im 5/103  bone]
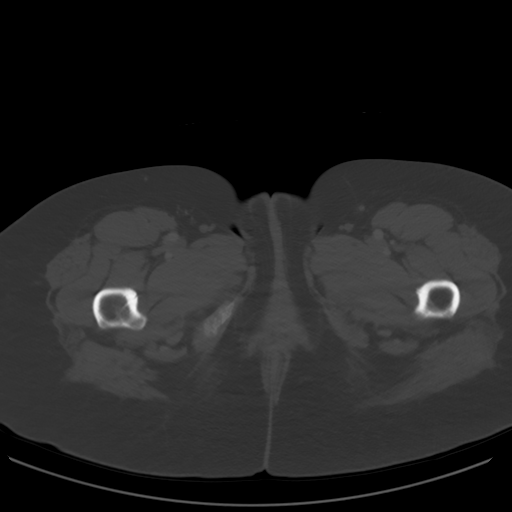
[im 13/103  soft-tissue]
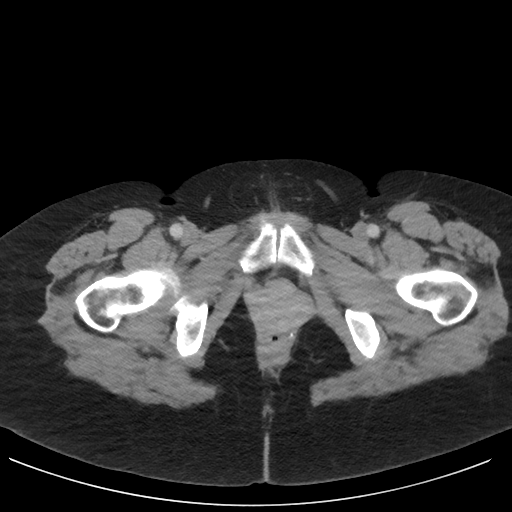
[im 22/103  soft-tissue]
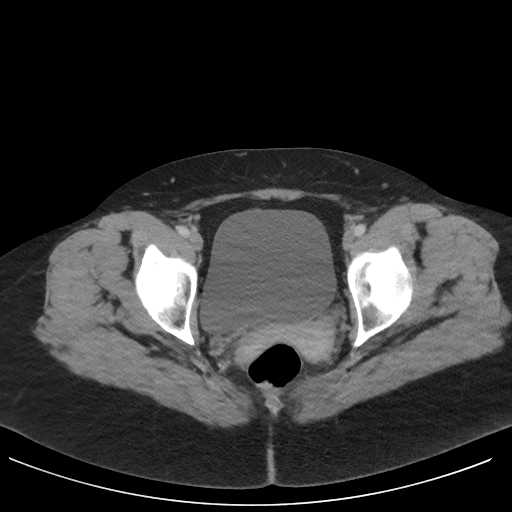
[im 30/103  soft-tissue]
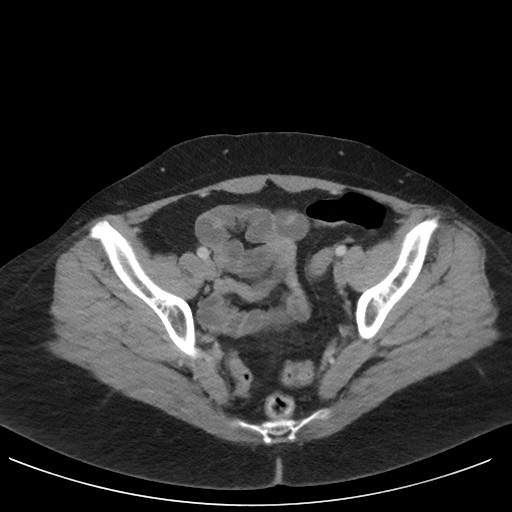
[im 35/103  soft-tissue]
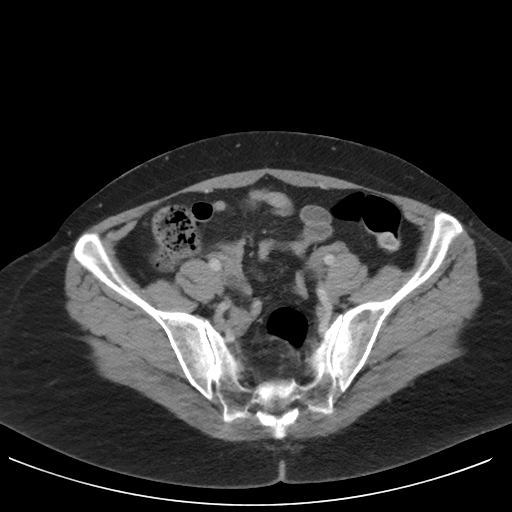
[im 43/103  soft-tissue]
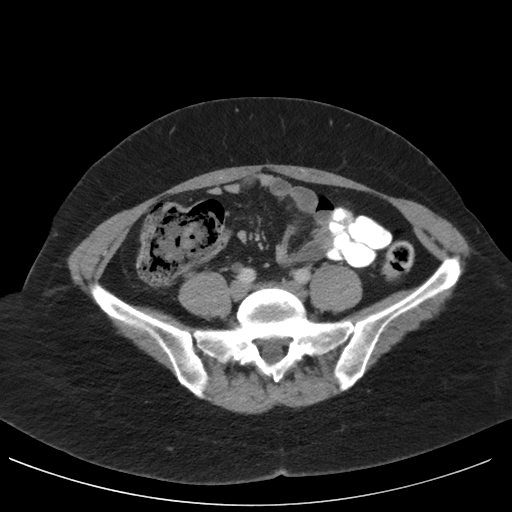
[im 52/103  soft-tissue]
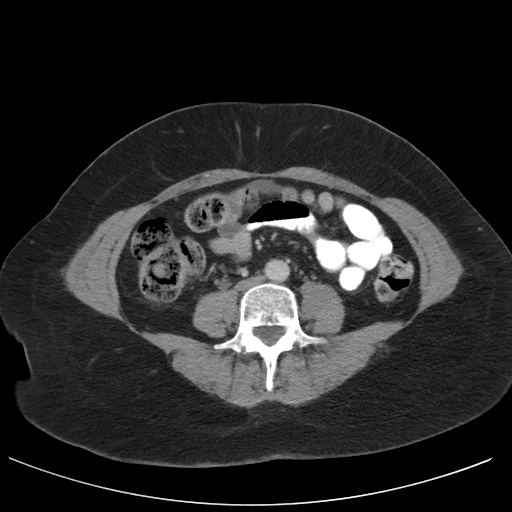
[im 60/103  soft-tissue]
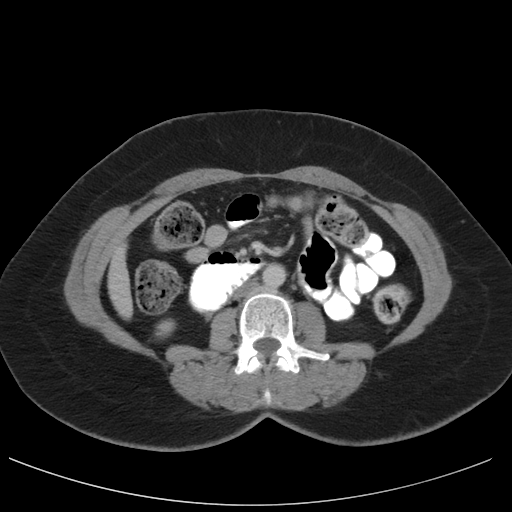
[im 69/103  soft-tissue]
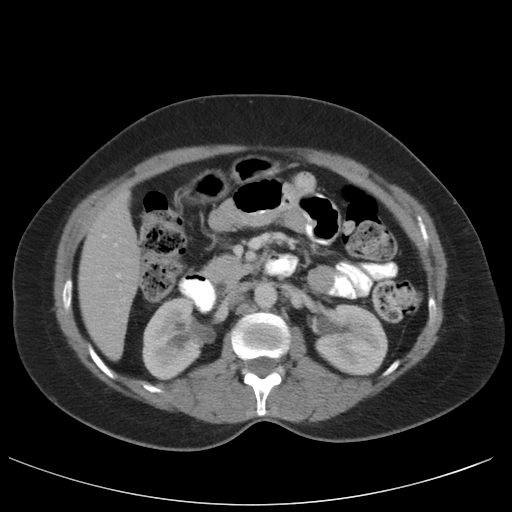
[im 69/103  bone]
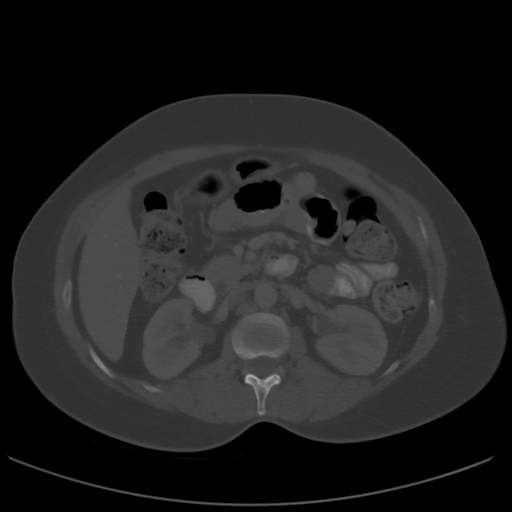
[im 73/103  soft-tissue]
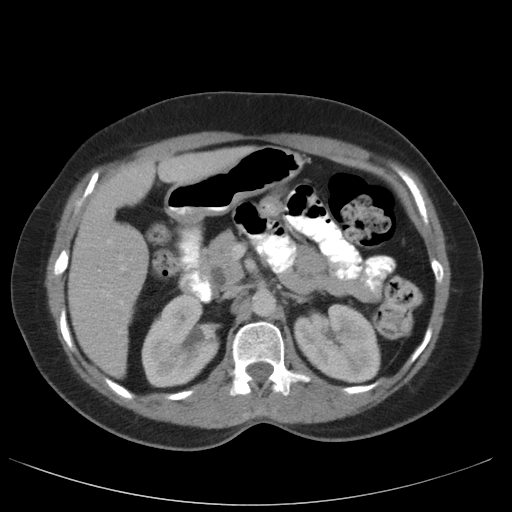
[im 81/103  soft-tissue]
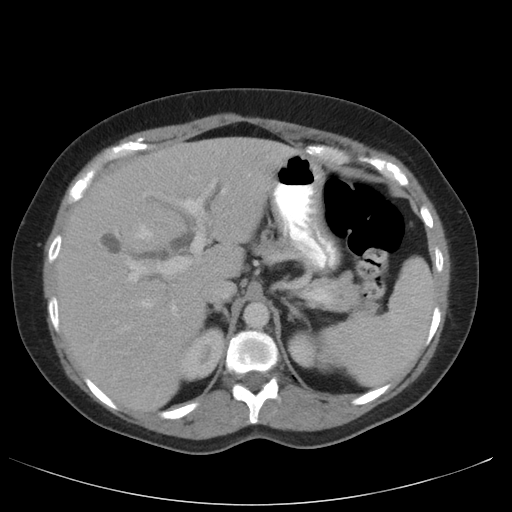
[im 90/103  soft-tissue]
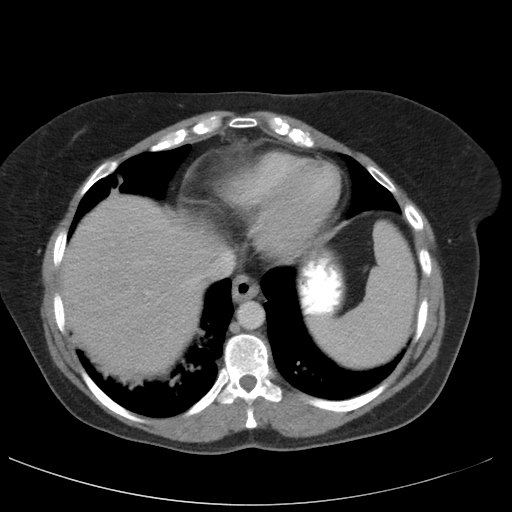
[im 98/103  soft-tissue]
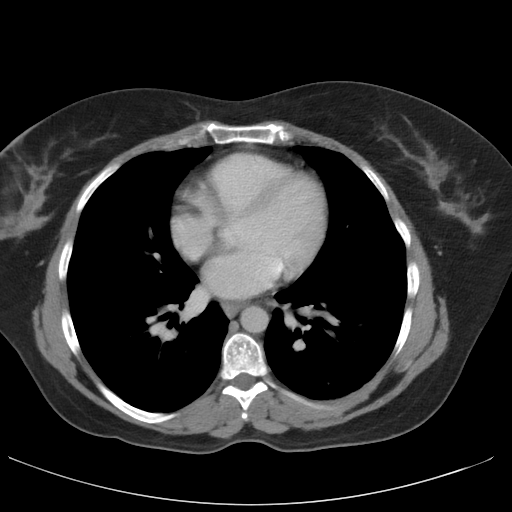

[Series 8048: coronals · coronal · 0.99mm/px · 3 of 107 slices shown]
[im 36/107  soft-tissue]
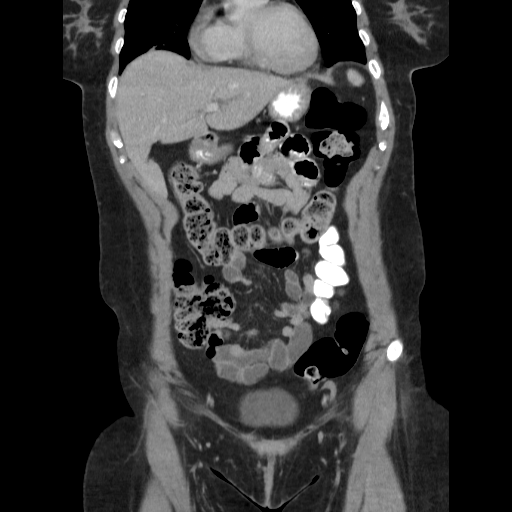
[im 48/107  soft-tissue]
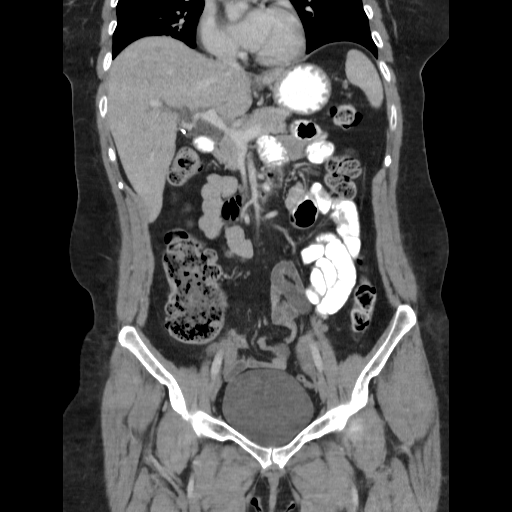
[im 59/107  soft-tissue]
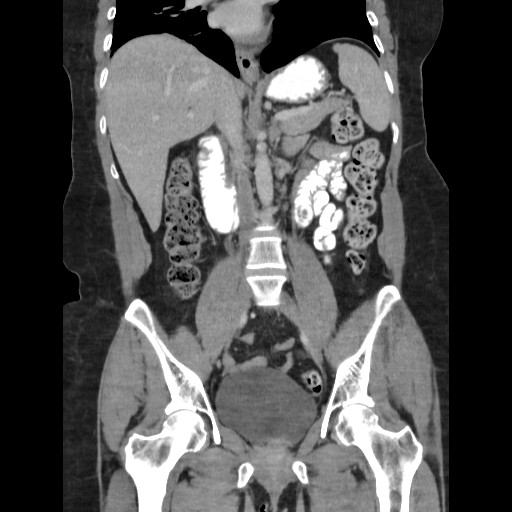

[16 of 46 positions shown; findings below may reference images not displayed]

FINDINGS: Patchy ground-glass attenuation is present at the lung bases
bilaterally, right greater than left. The heart size is normal. No
significant pleural or pericardial effusion is present.

Intra and extra hepatic biliary dilation has increased. There is a
low-density lesion in the right lobe of the liver on image 23 of
series 2 which has increased in size, now measuring 17 mm. This
represents either a cyst or likely a biloma. The extra hepatic
biliary duct measures up to 15 mm. No obstructing lesion is evident.

The spleen is unremarkable. The stomach and duodenum are within
normal limits. The pancreas is otherwise unremarkable. No mass
lesion is evident. There is no significant dilation of the
pancreatic duct. An 11 mm cyst on the right at 12 mm cyst in the
left kidney are stable. The right ureter is mildly dilated to the
point where crosses the right iliac artery, likely physiologic.

The rectosigmoid colon is within normal limits. The remainder of the
colon is unremarkable. The appendix is visualized and normal. The
small bowel is within normal limits. The patient is status post
hysterectomy. A low-density lesion associated with the left ovary
measures 2.7 cm, likely a benign cyst. The right ovary is
unremarkable. No significant adenopathy or free fluid is present.

The bone windows demonstrate no focal lytic or blastic lesions.
IMPRESSION: 1. New by about lateral lower lobe airspace disease. This appears to
be more prominent than typical atelectasis and is concerning for
early infection or potentially even aspiration.
2. Slight increase in intra and extra hepatic biliary dilation. No
obstructing lesion is evident. MRCP could be used for further
evaluation if clinically indicated.
3. Status post hysterectomy. A cystic lesion in the left ovary
appears benign.

## 2018-01-13 ENCOUNTER — Emergency Department (HOSPITAL_COMMUNITY)
Admission: EM | Admit: 2018-01-13 | Discharge: 2018-01-13 | Disposition: A | Discharge status: Home / Self Care | Payer: No Typology Code available for payment source | Attending: Emergency Medicine | Admitting: Emergency Medicine

## 2018-01-13 ENCOUNTER — Other Ambulatory Visit: Payer: Self-pay

## 2018-01-13 ENCOUNTER — Encounter (HOSPITAL_COMMUNITY): Payer: Self-pay | Admitting: Emergency Medicine

## 2018-01-13 DIAGNOSIS — T401X1A Poisoning by heroin, accidental (unintentional), initial encounter: Secondary | ICD-10-CM | POA: Insufficient documentation

## 2018-01-13 DIAGNOSIS — J449 Chronic obstructive pulmonary disease, unspecified: Secondary | ICD-10-CM | POA: Insufficient documentation

## 2018-01-13 DIAGNOSIS — Z87891 Personal history of nicotine dependence: Secondary | ICD-10-CM | POA: Insufficient documentation

## 2018-01-13 DIAGNOSIS — F1110 Opioid abuse, uncomplicated: Secondary | ICD-10-CM | POA: Insufficient documentation

## 2018-01-13 NOTE — ED Notes (Signed)
Unsuccessful IV attempt by this RN and another Charity fundraiserN. MD notified and order for IV team to be placed. Patient still alert and in  NAD.

## 2018-01-13 NOTE — ED Provider Notes (Signed)
[facility] Provider Note   CSN: [id number] Arrival date & time: 01/13/18  1045     History   Chief Complaint Chief Complaint  Patient presents with  . Drug Overdose    HPI Karen Beverely PaceCheek is a 47 y.o. female.  47 year old female with prior history of IV drug abuse presents after a reported heroin overdose.  Patient injected heroin just prior to coming to the emergency department.  She is unsure of how much she injected.  EMS found the patient being back by the fire department.  She was near apneic.  2 mg of Narcan given intranasally with improvement in her mental status was noted.  She denies coingestions.  She denies any other current complaint upon arrival to the ED.  She reports a long-standing history of IV drug abuse. She reports a longstanding history of polysubstance abuse - but she primarily uses heroin now. She has been clean in the past - however, she has been using again "since my mother died."     The history is provided by the patient and medical records.  Drug Overdose  This is a recurrent problem. The current episode started less than 1 hour ago. The problem occurs rarely. The problem has been gradually improving. Pertinent negatives include no chest pain, no abdominal pain, no headaches and no shortness of breath. Nothing aggravates the symptoms. Nothing relieves the symptoms.    Past Medical History:  Diagnosis Date  . Anxiety   . Bulging disc   . CAP (community acquired pneumonia) 07/25/2013  . Chronic bronchitis (HCC)    "get it sometimes twice/yr" (07/25/2013)  . Chronic lower back pain   . COPD (chronic obstructive pulmonary disease) (HCC)   . Depression   . GERD (gastroesophageal reflux disease)   . Hepatitis C 03/2012   diagnosed by labs done at prison.    . Kidney stone    "passed on my own"  . Migraine    "once or twice/month" (07/25/2013)  . Pelvic fracture (HCC)    non-surgical mgt.   . Pneumonia 1979; 2004   "double";   Marland Kitchen. Shortness of breath    "comes on at anytime" (07/25/2013)  . Vaginal delivery 1999    Patient Active Problem List   Diagnosis Date Noted  . Radiculopathy of lumbosacral region 01/06/2014  . Abdominal pain in female patient 10/03/2013  . Alternating constipation and diarrhea 10/03/2013  . Tobacco abuse 07/28/2013  . Hypoxia 07/27/2013  . Sinusitis 07/26/2013  . Transaminitis 07/26/2013  . Hematuria 07/26/2013  . CAP (community acquired pneumonia) 07/25/2013  . Abdominal pain 07/25/2013  . Nonspecific (abnormal) findings on radiological and other examination of biliary tract 07/25/2013  . Nonspecific elevation of levels of transaminase or lactic acid dehydrogenase (LDH) 07/25/2013  . Hepatitis C   . Chronic bronchitis (HCC)   . Renal disorder     Past Surgical History:  Procedure Laterality Date  . [place]LE[place]  . HYSTEROSCOPY  1999  . TUBAL LIGATION  1999  . VAGINAL HYSTERECTOMY  2001   "partial", for menorrhagia associated with uterine fibroids.      OB History   None      Home Medications    Prior to Admission medications   Not on File    Family History Family History  Problem Relation Age of Onset  . Aneurysm Father   . Cancer Other   . Diabetes Other   . Thyroid disease Other   .  Irritable bowel syndrome Other   . Aneurysm Other     Social History Social History   Tobacco Use  . Smoking status: Former Smoker    Packs/day: 0.50    Years: 15.00    Pack years: 7.50    Types: Cigarettes  . Smokeless tobacco: Never Used  Substance Use Topics  . Alcohol use: No  . Drug use: Yes    Comment: Heroin     Allergies   Azithromycin; Levaquin [levofloxacin]; Bactrim [sulfamethoxazole-trimethoprim]; and Penicillins   Review of Systems Review of Systems  Respiratory: Negative for shortness of breath.   Cardiovascular: Negative for chest pain.  Gastrointestinal: Negative for abdominal pain.  Neurological:  Negative for headaches.  All other systems reviewed and are negative.    Physical Exam Updated Vital Signs BP 113/80   Pulse 91   Temp 99.6 F (37.6 C) (Oral)   Resp 15   SpO2 96%   Physical Exam  Constitutional: She is oriented to person, place, and time. She appears well-developed and well-nourished. No distress.  HENT:  Head: Normocephalic and atraumatic.  Mouth/Throat: Oropharynx is clear and moist.  Eyes: Pupils are equal, round, and reactive to light. Conjunctivae and EOM are normal.  Neck: Normal range of motion. Neck supple.  Cardiovascular: Normal rate, regular rhythm and normal heart sounds.  Pulmonary/Chest: Effort normal and breath sounds normal. No respiratory distress.  Abdominal: Soft. She exhibits no distension. There is no tenderness.  Musculoskeletal: Normal range of motion. She exhibits no edema or deformity.  Neurological: She is alert and oriented to person, place, and time.  Skin: Skin is warm and dry.  Multiple track marks to BUE   Psychiatric: She has a normal mood and affect.  Nursing note and vitals reviewed.    ED Treatments / Results  Labs (all labs ordered are listed, but only abnormal results are displayed) Labs Reviewed - No data to display  EKG EKG Interpretation  Date/Time:  Saturday January 13 2018 10:53:48 EDT Ventricular Rate:  103 PR Interval:    QRS Duration: 98 QT Interval:  337 QTC Calculation: 442 R Axis:   92 Text Interpretation:  Sinus tachycardia Borderline right axis deviation Confirmed by Kristine Royal [phone]) on 01/13/2018 10:58:07 AM   Radiology No results found.  Procedures Procedures (including critical care time)  Medications Ordered in ED Medications - No data to display   Initial Impression / Assessment and Plan / ED Course  I have reviewed the triage vital signs and the nursing notes.  Pertinent labs & imaging results that were available during my care of the patient were reviewed by me and considered in  my medical decision making (see chart for details).     MDM  Screen complete  Patient is presenting for evaluation following heroin overdose.  Patient was given Narcan in the field with improvement.  She was observed in the ED for 2 hours following this initial administration of Narcan.  She did not require further intervention while in the ED.  She desires discharge.    She understands the need for close follow-up.  She is aware of, and has used in the past, resources for detox.   Final Clinical Impressions(s) / ED Diagnoses   Final diagnoses:  Accidental overdose of heroin, initial encounter [facility])    ED Discharge Orders    None       Wynetta Fines, MD 01/13/18 1252

## 2018-01-13 NOTE — Discharge Instructions (Addendum)
Please return for any problem. Follow up with your regular physician as instructed.  °

## 2018-01-13 NOTE — ED Triage Notes (Signed)
Per [facility] pt was pulled out from vehicle by fire, pt was unresponsive breathing about 3-4 times/minute. Given 2 doses narcan en route and bagged. Patient alert and responding spontaneously in triage. Pt states she ingested about 40cc heroin in her right arm.

## 2018-01-13 NOTE — ED Notes (Signed)
IV team at bedside 

## 2018-02-17 ENCOUNTER — Emergency Department (HOSPITAL_COMMUNITY): Payer: Self-pay

## 2018-02-17 ENCOUNTER — Emergency Department (HOSPITAL_COMMUNITY)
Admission: EM | Admit: 2018-02-17 | Discharge: 2018-02-17 | Payer: Self-pay | Attending: Emergency Medicine | Admitting: Emergency Medicine

## 2018-02-17 ENCOUNTER — Other Ambulatory Visit: Payer: Self-pay

## 2018-02-17 DIAGNOSIS — S32050A Wedge compression fracture of fifth lumbar vertebra, initial encounter for closed fracture: Secondary | ICD-10-CM | POA: Insufficient documentation

## 2018-02-17 DIAGNOSIS — Y929 Unspecified place or not applicable: Secondary | ICD-10-CM | POA: Insufficient documentation

## 2018-02-17 DIAGNOSIS — Y9389 Activity, other specified: Secondary | ICD-10-CM | POA: Insufficient documentation

## 2018-02-17 DIAGNOSIS — Y999 Unspecified external cause status: Secondary | ICD-10-CM | POA: Insufficient documentation

## 2018-02-17 DIAGNOSIS — J449 Chronic obstructive pulmonary disease, unspecified: Secondary | ICD-10-CM | POA: Insufficient documentation

## 2018-02-17 DIAGNOSIS — Z87891 Personal history of nicotine dependence: Secondary | ICD-10-CM | POA: Insufficient documentation

## 2018-02-17 MED ORDER — KETOROLAC TROMETHAMINE 60 MG/2ML IM SOLN
30.0000 mg | Freq: Once | INTRAMUSCULAR | Status: AC
  Administered 2018-02-17: 30 mg via INTRAMUSCULAR
  Filled 2018-02-17: qty 2

## 2018-02-17 MED ORDER — HYDROCODONE-ACETAMINOPHEN 5-325 MG PO TABS: 1.0000 | ORAL_TABLET | Freq: Four times a day (QID) | ORAL | 0 refills | Status: DC | PRN

## 2018-02-17 MED ORDER — IBUPROFEN 600 MG PO TABS: 600.0000 mg | ORAL_TABLET | Freq: Three times a day (TID) | ORAL | 0 refills | Status: DC | PRN

## 2018-02-17 NOTE — ED Triage Notes (Addendum)
Pt to ED with [employer] with c/o of back pain on the right lower side of her back. Pt states that she has had L4-L5 degenerative disc disease with known disc budging. Pt states the pain is 10/10. Pt states that her injury was from falling and resisting arrest. Pt also admits to using cocaine and heroin today.  Pt is A&O x4. Pt is in custody of [employer] at this time.
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
## 2018-02-17 NOTE — ED Notes (Signed)
Patient transported to X-ray 
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
## 2019-07-10 ENCOUNTER — Ambulatory Visit
Admission: EM | Admit: 2019-07-10 | Discharge: 2019-07-10 | Disposition: A | Discharge status: Home / Self Care | Payer: Self-pay

## 2019-07-10 ENCOUNTER — Other Ambulatory Visit: Payer: Self-pay

## 2019-07-10 DIAGNOSIS — S0992XA Unspecified injury of nose, initial encounter: Secondary | ICD-10-CM

## 2019-07-10 DIAGNOSIS — S0990XA Unspecified injury of head, initial encounter: Secondary | ICD-10-CM
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
## 2019-07-12 ENCOUNTER — Other Ambulatory Visit: Payer: Self-pay

## 2019-07-12 ENCOUNTER — Emergency Department (HOSPITAL_COMMUNITY): Payer: Self-pay

## 2019-07-12 ENCOUNTER — Encounter (HOSPITAL_COMMUNITY): Payer: Self-pay

## 2019-07-12 ENCOUNTER — Emergency Department (HOSPITAL_COMMUNITY)
Admission: EM | Admit: 2019-07-12 | Discharge: 2019-07-12 | Disposition: A | Discharge status: Home / Self Care | Payer: Self-pay | Attending: Emergency Medicine | Admitting: Emergency Medicine

## 2019-07-12 DIAGNOSIS — Y998 Other external cause status: Secondary | ICD-10-CM | POA: Insufficient documentation

## 2019-07-12 DIAGNOSIS — R0789 Other chest pain: Secondary | ICD-10-CM

## 2019-07-12 DIAGNOSIS — F17210 Nicotine dependence, cigarettes, uncomplicated: Secondary | ICD-10-CM | POA: Insufficient documentation

## 2019-07-12 DIAGNOSIS — Y929 Unspecified place or not applicable: Secondary | ICD-10-CM | POA: Insufficient documentation

## 2019-07-12 DIAGNOSIS — Y9389 Activity, other specified: Secondary | ICD-10-CM | POA: Insufficient documentation

## 2019-07-12 DIAGNOSIS — S0083XA Contusion of other part of head, initial encounter: Secondary | ICD-10-CM

## 2019-07-12 DIAGNOSIS — F1410 Cocaine abuse, uncomplicated: Secondary | ICD-10-CM | POA: Insufficient documentation

## 2019-07-12 DIAGNOSIS — S022XXA Fracture of nasal bones, initial encounter for closed fracture: Secondary | ICD-10-CM

## 2019-07-12 DIAGNOSIS — J449 Chronic obstructive pulmonary disease, unspecified: Secondary | ICD-10-CM | POA: Insufficient documentation
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
## 2019-07-12 NOTE — Discharge Instructions (Signed)
You have a fracture of your nasal bone.  Ice pack.  Tylenol for pain.  Recommend follow-up with ear nose and throat.  Phone number given.

## 2020-12-20 ENCOUNTER — Emergency Department (HOSPITAL_BASED_OUTPATIENT_CLINIC_OR_DEPARTMENT_OTHER): Payer: Self-pay | Admitting: Radiology

## 2020-12-20 ENCOUNTER — Emergency Department (HOSPITAL_BASED_OUTPATIENT_CLINIC_OR_DEPARTMENT_OTHER)
Admission: EM | Admit: 2020-12-20 | Discharge: 2020-12-20 | Disposition: A | Discharge status: Home / Self Care | Payer: Self-pay | Attending: Emergency Medicine | Admitting: Emergency Medicine

## 2020-12-20 ENCOUNTER — Other Ambulatory Visit: Payer: Self-pay

## 2020-12-20 ENCOUNTER — Encounter (HOSPITAL_BASED_OUTPATIENT_CLINIC_OR_DEPARTMENT_OTHER): Payer: Self-pay | Admitting: *Deleted

## 2020-12-20 DIAGNOSIS — J449 Chronic obstructive pulmonary disease, unspecified: Secondary | ICD-10-CM | POA: Insufficient documentation

## 2020-12-20 DIAGNOSIS — F17210 Nicotine dependence, cigarettes, uncomplicated: Secondary | ICD-10-CM | POA: Insufficient documentation

## 2020-12-20 DIAGNOSIS — W19XXXA Unspecified fall, initial encounter: Secondary | ICD-10-CM | POA: Insufficient documentation

## 2020-12-20 DIAGNOSIS — S29011A Strain of muscle and tendon of front wall of thorax, initial encounter: Secondary | ICD-10-CM | POA: Insufficient documentation

## 2020-12-20 MED ORDER — IBUPROFEN 800 MG PO TABS
800.0000 mg | ORAL_TABLET | Freq: Once | ORAL | Status: AC
  Administered 2020-12-20: 800 mg via ORAL
  Filled 2020-12-20: qty 1

## 2020-12-20 NOTE — ED Provider Notes (Signed)
[facility] EMERGENCY DEPT Provider Note   CSN: [id number] Arrival date & time: 12/20/20  0054     History Chief Complaint  Patient presents with   Karen Baldwin is a 50 y.o. female.   Presented to the emergency room with concern for right-sided rib pain after fall.  Patient reports fall occurred yesterday.  Fell from standing.  Unsure exactly how she fell but landed on hard ground.  Pain is moderate, aching, nonradiating.  No alleviating or aggravating factors.  No difficulty in breathing.  She denies blood thinner use.  Denies head trauma.  HPI     Past Medical History:  Diagnosis Date   Anxiety    Bulging disc    CAP (community acquired pneumonia) 07/25/2013   Chronic bronchitis (HCC)    "get it sometimes twice/yr" (07/25/2013)   Chronic lower back pain    COPD (chronic obstructive pulmonary disease) (HCC)    Depression    GERD (gastroesophageal reflux disease)    Hepatitis C 03/2012   diagnosed by labs done at prison.     Kidney stone    "passed on my own"   Migraine    "once or twice/month" (07/25/2013)   Pelvic fracture (HCC)    non-surgical mgt.    Pneumonia 1979; 2004   "double";    Shortness of breath    "comes on at anytime" (07/25/2013)   Vaginal delivery 1999    Patient Active Problem List   Diagnosis Date Noted   Radiculopathy of lumbosacral region 01/06/2014   Abdominal pain in female patient 10/03/2013   Alternating constipation and diarrhea 10/03/2013   Tobacco abuse 07/28/2013   Hypoxia 07/27/2013   Sinusitis 07/26/2013   Transaminitis 07/26/2013   Hematuria 07/26/2013   CAP (community acquired pneumonia) 07/25/2013   Abdominal pain 07/25/2013   Nonspecific (abnormal) findings on radiological and other examination of biliary tract 07/25/2013   Nonspecific elevation of levels of transaminase or lactic acid dehydrogenase (LDH) 07/25/2013   Hepatitis C    Chronic bronchitis (HCC)    Renal disorder     Past Surgical History:   Procedure Laterality Date   CHOLECYSTECTOMY  2009   in [place]   HYSTEROSCOPY  1999   TUBAL LIGATION  1999   VAGINAL HYSTERECTOMY  2001   "partial", for menorrhagia associated with uterine fibroids.      OB History   No obstetric history on file.     Family History  Problem Relation Age of Onset   Aneurysm Father    Cancer Other    Diabetes Other    Thyroid disease Other    Irritable bowel syndrome Other    Aneurysm Other     Social History   Tobacco Use   Smoking status: Some Days    Packs/day: 0.50    Years: 15.00    Pack years: 7.50    Types: Cigarettes   Smokeless tobacco: Never  Vaping Use   Vaping Use: Never used  Substance Use Topics   Alcohol use: No   Drug use: Yes    Types: Cocaine    Comment: Heroin    Home Medications Prior to Admission medications   Not on File    Allergies    Azithromycin, Levaquin [levofloxacin], Bactrim [sulfamethoxazole-trimethoprim], and Penicillins  Review of Systems   Review of Systems  Cardiovascular:  Positive for chest pain.  All other systems reviewed and are negative.  Physical Exam Updated Vital Signs BP 104/75 (BP Location: Right  Arm)   Pulse 73   Temp 98.1 F (36.7 C) (Oral)   Resp 16   SpO2 97%   Physical Exam Vitals and nursing note reviewed.  Constitutional:      General: She is not in acute distress.    Appearance: She is well-developed.  HENT:     Head: Normocephalic and atraumatic.  Eyes:     Conjunctiva/sclera: Conjunctivae normal.  Cardiovascular:     Rate and Rhythm: Normal rate and regular rhythm.     Heart sounds: No murmur heard. Pulmonary:     Effort: Pulmonary effort is normal. No respiratory distress.     Breath sounds: Normal breath sounds.  Chest:     Comments: There is some tenderness over the right lateral chest wall, no crepitus or deformity appreciated Abdominal:     Palpations: Abdomen is soft.     Tenderness: There is no abdominal tenderness.  Musculoskeletal:      Cervical back: Neck supple.  Skin:    General: Skin is warm and dry.  Neurological:     Mental Status: She is alert.    ED Results / Procedures / Treatments   Labs (all labs ordered are listed, but only abnormal results are displayed) Labs Reviewed - No data to display  EKG None  Radiology DG Ribs Unilateral W/Chest Right  Result Date: 12/20/2020 CLINICAL DATA:  Chest pain after fall EXAM: RIGHT RIBS AND CHEST - 3+ VIEW COMPARISON:  07/12/2019 FINDINGS: No visible displaced rib fracture. No confluent airspace opacities or effusions. Heart is normal size. IMPRESSION: No visible acute rib fracture. Electronically Signed   By: Charlett Nose M.D.   On: 12/20/2020 02:49    Procedures Procedures   Medications Ordered in ED Medications  ibuprofen (ADVIL) tablet 800 mg (has no administration in time range)    ED Course  I have reviewed the triage vital signs and the nursing notes.  Pertinent labs & imaging results that were available during my care of the patient were reviewed by me and considered in my medical decision making (see chart for details).    MDM Rules/Calculators/A&P                          50 year old lady presenting to ER after mechanical fall, right-sided rib pain.  On exam tenderness over right lateral chest wall but no crepitus or deformity.  No hypoxia or tachypnea.  Plain films negative.  Suspect MSK strain.  Follow-up with primary doctor.    After the discussed management above, the patient was determined to be safe for discharge.  The patient was in agreement with this plan and all questions regarding their care were answered.  ED return precautions were discussed and the patient will return to the ED with any significant worsening of condition.  Final Clinical Impression(s) / ED Diagnoses Final diagnoses:  Muscle strain of chest wall, initial encounter    Rx / DC Orders ED Discharge Orders     None        Milagros Loll, MD 12/20/20  [phone]

## 2020-12-20 NOTE — ED Notes (Signed)
Officers at the bedside with patient.
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
## 2020-12-20 NOTE — ED Notes (Signed)
Officers given D/C papers.

## 2020-12-20 NOTE — Discharge Instructions (Addendum)
Take Tylenol or Motrin for pain control.  Follow-up with your primary doctor for recheck this coming week.  Return for difficulty in breathing, worsening chest pain or other new concerning symptom.
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
## 2022-10-12 ENCOUNTER — Ambulatory Visit
Admission: EM | Admit: 2022-10-12 | Discharge: 2022-10-12 | Disposition: A | Discharge status: Home / Self Care | Payer: Self-pay | Attending: Internal Medicine | Admitting: Internal Medicine

## 2022-10-12 DIAGNOSIS — F1490 Cocaine use, unspecified, uncomplicated: Secondary | ICD-10-CM

## 2022-10-12 DIAGNOSIS — A084 Viral intestinal infection, unspecified: Secondary | ICD-10-CM

## 2022-10-12 DIAGNOSIS — G43809 Other migraine, not intractable, without status migrainosus: Secondary | ICD-10-CM

## 2022-10-12 LAB — POCT URINALYSIS DIP (MANUAL ENTRY)
Bilirubin, UA: NEGATIVE
Glucose, UA: NEGATIVE mg/dL
Leukocytes, UA: NEGATIVE
Nitrite, UA: NEGATIVE
Protein Ur, POC: 100 mg/dL — AB
Spec Grav, UA: >=1.03 — AB (ref 1.010–1.025)
Urobilinogen, UA: 0.2 E.U./dL
pH, UA: 7 (ref 5.0–8.0)

## 2022-10-12 MED ORDER — SUMATRIPTAN SUCCINATE 25 MG PO TABS: ORAL_TABLET | ORAL | 0 refills | Status: AC

## 2022-10-12 MED ORDER — ONDANSETRON HCL 4 MG/2ML IJ SOLN
4.0000 mg | Freq: Once | INTRAMUSCULAR | Status: AC
  Administered 2022-10-12: 4 mg via INTRAVENOUS

## 2022-10-12 MED ORDER — KETOROLAC TROMETHAMINE 30 MG/ML IJ SOLN: 30.0000 mg | Freq: Once | INTRAMUSCULAR | Status: DC

## 2022-10-12 MED ORDER — LOPERAMIDE HCL 2 MG PO CAPS: 2.0000 mg | ORAL_CAPSULE | Freq: Two times a day (BID) | ORAL | 0 refills | Status: AC | PRN

## 2022-10-12 MED ORDER — KETOROLAC TROMETHAMINE 30 MG/ML IJ SOLN
30.0000 mg | Freq: Once | INTRAMUSCULAR | Status: AC
  Administered 2022-10-12: 30 mg via INTRAVENOUS

## 2022-10-12 MED ORDER — ONDANSETRON 8 MG PO TBDP: 8.0000 mg | ORAL_TABLET | Freq: Three times a day (TID) | ORAL | 0 refills | Status: AC | PRN

## 2022-10-12 MED ORDER — SUMATRIPTAN SUCCINATE 6 MG/0.5ML ~~LOC~~ SOLN
6.0000 mg | Freq: Once | SUBCUTANEOUS | Status: AC
  Administered 2022-10-12: 6 mg via SUBCUTANEOUS

## 2022-10-12 MED ORDER — EXCEDRIN MIGRAINE 250-250-65 MG PO TABS: 1.0000 | ORAL_TABLET | Freq: Four times a day (QID) | ORAL | 0 refills | Status: AC | PRN

## 2022-10-12 MED ORDER — SODIUM CHLORIDE 0.9 % IV BOLUS
1000.0000 mL | Freq: Once | INTRAVENOUS | Status: AC
  Administered 2022-10-12: 1000 mL via INTRAVENOUS

## 2022-10-12 NOTE — ED Provider Notes (Signed)
[facility]  Note:  This document was prepared using Systems analyst and may include unintentional dictation errors.  MRN: [id number] DOB: 12/05/1970  Subjective:   Karen Baldwin is a 52 y.o. female presenting for 1 day history of acute onset persistent nausea, vomiting, diarrhea, severe headache with photophobia, migraine type headache.  Patient believes that symptoms started after she smoked cocaine that may have been laced with other substances.  She also believes that she ate some bad tuna. No fever, bloody stools, chest pain, heart racing, palpitations, diaphoresis, recent antibiotic use, hospitalizations or long distance travel.  Has not eaten raw foods, drank unfiltered water.  No history of GI disorders including Crohn's, IBS, ulcerative colitis.  No other drug use.  Patient denies having kidney disease.  Reports that she has had dehydration and kidney stones in the past, was previously told she had a cyst of the kidney but is not restricted from taking specific medicines.  She has had migraines in the past and typically responds to headache medicine such as Imitrex, Excedrin.  Would like to have fluids today.  No current facility-administered medications for this encounter. No current outpatient medications on file.   Allergies  Allergen Reactions  . Azithromycin Anaphylaxis and Other (See Comments)    Liver failure   . Levaquin [Levofloxacin] Hives    Patient with pruritis and erythema at IV site after Levofloxacin adminisation  . Bactrim [Sulfamethoxazole-Trimethoprim] Other (See Comments)    "skin burning" blistering  . Penicillins Other (See Comments)    Has patient had a PCN reaction causing immediate rash, facial/tongue/throat swelling, SOB or lightheadedness with hypotension: Unknown Has patient had a PCN reaction causing severe rash involving mucus membranes or skin necrosis: Unknown Has patient had a PCN reaction that required  hospitalization: Unknown Has patient had a PCN reaction occurring within the last 10 years: Unknown If all of the above answers are "NO", then may proceed with Cephalosporin use.     Past Medical History:  Diagnosis Date  . Anxiety   . Bulging disc   . CAP (community acquired pneumonia) 07/25/2013  . Chronic bronchitis    "get it sometimes twice/yr" (07/25/2013)  . Chronic lower back pain   . COPD (chronic obstructive pulmonary disease)   . Depression   . GERD (gastroesophageal reflux disease)   . Hepatitis C 03/2012   diagn[facility].    . Kidney stone    "passed on my own"  . Migraine    "once or twice/month" (07/25/2013)  . Pelvic fracture    non-surgical mgt.   . Pneumonia 1979; 2004   "double";   Marland Kitchen Shortness of breath    "comes on at anytime" (07/25/2013)  . Vaginal delivery 1999     Past Surgical History:  Procedure Laterality Date  . CHOLECYSTECTOMY  2009   in [place]  . HYSTEROSCOPY  1999  . TUBAL LIGATION  1999  . VAGINAL HYSTERECTOMY  2001   "partial", for menorrhagia associated with uterine fibroids.     Family History  Problem Relation Age of Onset  . Aneurysm Father   . Cancer Other   . Diabetes Other   . Thyroid disease Other   . Irritable bowel syndrome Other   . Aneurysm Other     Social History   Tobacco Use  . Smoking status: Some Days    Types: Cigarettes  . Smokeless tobacco: Never  Vaping Use  . Vaping Use: Never  used  Substance Use Topics  . Alcohol use: No  . Drug use: Yes    Types: Cocaine    Comment: Heroin-none x 3 years    ROS   Objective:   Vitals: BP (!) 147/100 (BP Location: Right Arm)   Pulse (!) 110   Temp 99.1 F (37.3 C) (Oral)   Resp 20   SpO2 96%   Physical Exam Constitutional:      General: She is not in acute distress.    Appearance: Normal appearance. She is well-developed. She is not ill-appearing, toxic-appearing or diaphoretic.  HENT:     Head: Normocephalic and atraumatic.      Right Ear: External ear normal.     Left Ear: External ear normal.     Nose: Nose normal.     Mouth/Throat:     Mouth: Mucous membranes are dry.  Eyes:     General: No scleral icterus.       Right eye: No discharge.        Left eye: No discharge.     Extraocular Movements: Extraocular movements intact.     Conjunctiva/sclera: Conjunctivae normal.     Pupils: Pupils are equal, round, and reactive to light.     Comments: Photophobia noted.   Cardiovascular:     Rate and Rhythm: Normal rate and regular rhythm.     Heart sounds: Normal heart sounds. No murmur heard.    No friction rub. No gallop.  Pulmonary:     Effort: Pulmonary effort is normal. No respiratory distress.     Breath sounds: No stridor. No wheezing, rhonchi or rales.  Chest:     Chest wall: No tenderness.  Abdominal:     General: Bowel sounds are increased. There is no distension.     Palpations: Abdomen is soft. There is no mass.     Tenderness: There is generalized abdominal tenderness. There is no right CVA tenderness, left CVA tenderness, guarding or rebound.  Skin:    General: Skin is warm and dry.  Neurological:     General: No focal deficit present.     Mental Status: She is alert and oriented to person, place, and time.     Cranial Nerves: No cranial nerve deficit.     Motor: No weakness.     Coordination: Coordination normal.     Gait: Gait normal.     Comments: Negative Romberg and pronator drift.   Psychiatric:        Mood and Affect: Mood normal.        Behavior: Behavior normal.        Thought Content: Thought content normal.        Judgment: Judgment normal.    Results for orders placed or performed during the hospital encounter of 10/12/22 (from the past 24 hour(s))  POCT urinalysis dipstick     Status: Abnormal   Collection Time: 10/12/22  5:00 PM  Result Value Ref Range   Color, UA yellow yellow   Clarity, UA clear clear   Glucose, UA negative negative mg/dL   Bilirubin, UA negative  negative   Ketones, POC UA small (15) (A) negative mg/dL   Spec Grav, UA >=1.030 (A) 1.010 - 1.025   Blood, UA large (A) negative   pH, UA 7.0 5.0 - 8.0   Protein Ur, POC =100 (A) negative mg/dL   Urobilinogen, UA 0.2 0.2 or 1.0 E.U./dL   Nitrite, UA Negative Negative   Leukocytes, UA Negative Negative  Assessment and Plan :   PDMP not reviewed this encounter.  1. Other migraine without status migrainosus, not intractable   2. Viral gastroenteritis   3. Cocaine use

## 2022-10-12 NOTE — ED Notes (Signed)
Pt resting-NAD-states she may have to leave prior to completion of NS 1L-advised to let staff know if she needs to leave-agreed

## 2022-10-12 NOTE — ED Notes (Signed)
Pt notified PA is in a procedure-no d/c instructions or rx at this time-states she has to leave-pt left NAD-steady gait
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
## 2022-10-12 NOTE — ED Notes (Signed)
Pt to nse desk-states she has to leave now and requests IV to be removed

## 2023-12-01 ENCOUNTER — Other Ambulatory Visit (HOSPITAL_COMMUNITY): Payer: Self-pay
